# Patient Record
Sex: Male | Born: 1935 | Race: White | Hispanic: No | Marital: Married | Smoking: Former smoker
Health system: Southern US, Community
[De-identification: ages and names within clinical notes are randomized; demographics above are authoritative.]

## PROBLEM LIST (undated history)

## (undated) DIAGNOSIS — J189 Pneumonia, unspecified organism: Secondary | ICD-10-CM

## (undated) DIAGNOSIS — I119 Hypertensive heart disease without heart failure: Secondary | ICD-10-CM

## (undated) DIAGNOSIS — G20 Parkinson's disease: Secondary | ICD-10-CM

## (undated) DIAGNOSIS — G20A1 Parkinson's disease without dyskinesia, without mention of fluctuations: Secondary | ICD-10-CM

## (undated) DIAGNOSIS — I82409 Acute embolism and thrombosis of unspecified deep veins of unspecified lower extremity: Secondary | ICD-10-CM

## (undated) DIAGNOSIS — Z8673 Personal history of transient ischemic attack (TIA), and cerebral infarction without residual deficits: Secondary | ICD-10-CM

## (undated) DIAGNOSIS — I10 Essential (primary) hypertension: Secondary | ICD-10-CM

## (undated) DIAGNOSIS — I5032 Chronic diastolic (congestive) heart failure: Secondary | ICD-10-CM

## (undated) DIAGNOSIS — H9190 Unspecified hearing loss, unspecified ear: Secondary | ICD-10-CM

## (undated) DIAGNOSIS — I2510 Atherosclerotic heart disease of native coronary artery without angina pectoris: Secondary | ICD-10-CM

## (undated) DIAGNOSIS — I4821 Permanent atrial fibrillation: Secondary | ICD-10-CM

## (undated) DIAGNOSIS — E785 Hyperlipidemia, unspecified: Secondary | ICD-10-CM

## (undated) HISTORY — DX: Parkinson's disease without dyskinesia, without mention of fluctuations: G20.A1

## (undated) HISTORY — PX: EYE SURGERY: SHX253

## (undated) HISTORY — DX: Permanent atrial fibrillation: I48.21

## (undated) HISTORY — PX: OTHER SURGICAL HISTORY: SHX169

## (undated) HISTORY — DX: Chronic diastolic (congestive) heart failure: I50.32

## (undated) HISTORY — DX: Hypertensive heart disease without heart failure: I11.9

## (undated) HISTORY — PX: CORONARY ARTERY BYPASS GRAFT: SHX141

## (undated) HISTORY — PX: CARDIAC SURGERY: SHX584

## (undated) HISTORY — DX: Atherosclerotic heart disease of native coronary artery without angina pectoris: I25.10

## (undated) HISTORY — DX: Parkinson's disease: G20

## (undated) HISTORY — PX: LEG SURGERY: SHX1003

---

## 2011-12-21 ENCOUNTER — Emergency Department: Payer: Self-pay | Admitting: Emergency Medicine

## 2011-12-21 LAB — COMPREHENSIVE METABOLIC PANEL
Albumin: 3.6 g/dL (ref 3.4–5.0)
Alkaline Phosphatase: 88 U/L (ref 50–136)
Anion Gap: 7 (ref 7–16)
BUN: 17 mg/dL (ref 7–18)
Bilirubin,Total: 0.3 mg/dL (ref 0.2–1.0)
Calcium, Total: 8.2 mg/dL — ABNORMAL LOW (ref 8.5–10.1)
Chloride: 105 mmol/L (ref 98–107)
Co2: 29 mmol/L (ref 21–32)
Osmolality: 285 (ref 275–301)
Potassium: 3.9 mmol/L (ref 3.5–5.1)
Sodium: 141 mmol/L (ref 136–145)

## 2011-12-21 LAB — CBC
HCT: 42.5 % (ref 40.0–52.0)
HGB: 14.6 g/dL (ref 13.0–18.0)
RBC: 4.62 10*6/uL (ref 4.40–5.90)
WBC: 5.8 10*3/uL (ref 3.8–10.6)

## 2011-12-22 ENCOUNTER — Emergency Department: Payer: Self-pay | Admitting: Emergency Medicine

## 2011-12-22 LAB — COMPREHENSIVE METABOLIC PANEL
Alkaline Phosphatase: 84 U/L (ref 50–136)
Calcium, Total: 8.5 mg/dL (ref 8.5–10.1)
Co2: 30 mmol/L (ref 21–32)
EGFR (Non-African Amer.): >60
Osmolality: 285 (ref 275–301)
Potassium: 4.2 mmol/L (ref 3.5–5.1)
SGPT (ALT): 25 U/L (ref 12–78)
Total Protein: 7.4 g/dL (ref 6.4–8.2)

## 2011-12-22 LAB — CBC
HGB: 15 g/dL (ref 13.0–18.0)
MCH: 32 pg (ref 26.0–34.0)
MCHC: 35 g/dL (ref 32.0–36.0)
MCV: 91 fL (ref 80–100)
Platelet: 166 10*3/uL (ref 150–440)
RDW: 13.3 % (ref 11.5–14.5)

## 2011-12-22 LAB — URINALYSIS, COMPLETE
Bacteria: NONE SEEN
Bilirubin,UR: NEGATIVE
Glucose,UR: NEGATIVE mg/dL (ref 0–75)
Ketone: NEGATIVE
Ph: 6 (ref 4.5–8.0)
RBC,UR: 2 /HPF (ref 0–5)
Squamous Epithelial: NONE SEEN

## 2014-10-31 ENCOUNTER — Ambulatory Visit: Payer: Self-pay | Admitting: Internal Medicine

## 2014-10-31 DIAGNOSIS — I214 Non-ST elevation (NSTEMI) myocardial infarction: Secondary | ICD-10-CM | POA: Insufficient documentation

## 2014-11-14 ENCOUNTER — Ambulatory Visit: Payer: Self-pay | Admitting: Internal Medicine

## 2015-01-24 ENCOUNTER — Ambulatory Visit: Payer: Self-pay | Admitting: Ophthalmology

## 2015-01-31 ENCOUNTER — Ambulatory Visit: Payer: Self-pay

## 2015-04-09 ENCOUNTER — Ambulatory Visit: Payer: Self-pay

## 2015-04-09 DIAGNOSIS — G20 Parkinson's disease: Secondary | ICD-10-CM | POA: Insufficient documentation

## 2015-04-09 LAB — LIPID PANEL
CHOLESTEROL: 156 mg/dL (ref 0–200)
HDL: 35 mg/dL (ref 35–70)
LDL Cholesterol: 73 mg/dL
Triglycerides: 241 mg/dL — AB (ref 40–160)

## 2015-04-09 LAB — BASIC METABOLIC PANEL
BUN: 16 mg/dL (ref 4–21)
CREATININE: 0.8 mg/dL (ref 0.6–1.3)
GLUCOSE: 137 mg/dL
POTASSIUM: 4.1 mmol/L (ref 3.4–5.3)
Sodium: 141 mmol/L (ref 137–147)

## 2015-04-09 LAB — HEPATIC FUNCTION PANEL
ALK PHOS: 72 U/L (ref 25–125)
ALT: 23 U/L (ref 10–40)
AST: 22 U/L (ref 14–40)
Bilirubin, Total: 0.4 mg/dL

## 2015-04-09 LAB — TSH: TSH: 0.85 u[IU]/mL (ref 0.41–5.90)

## 2015-04-16 ENCOUNTER — Other Ambulatory Visit: Payer: Self-pay

## 2015-04-23 ENCOUNTER — Ambulatory Visit: Payer: Self-pay

## 2015-05-01 ENCOUNTER — Ambulatory Visit: Payer: Self-pay | Admitting: Internal Medicine

## 2015-05-03 DIAGNOSIS — I259 Chronic ischemic heart disease, unspecified: Secondary | ICD-10-CM

## 2015-05-03 DIAGNOSIS — G20 Parkinson's disease: Secondary | ICD-10-CM

## 2015-05-14 ENCOUNTER — Other Ambulatory Visit: Payer: Self-pay

## 2015-05-14 DIAGNOSIS — Z139 Encounter for screening, unspecified: Secondary | ICD-10-CM

## 2015-05-14 DIAGNOSIS — G20 Parkinson's disease: Secondary | ICD-10-CM

## 2015-05-14 DIAGNOSIS — I80209 Phlebitis and thrombophlebitis of unspecified deep vessels of unspecified lower extremity: Secondary | ICD-10-CM

## 2015-05-14 DIAGNOSIS — I4891 Unspecified atrial fibrillation: Secondary | ICD-10-CM

## 2015-05-15 ENCOUNTER — Ambulatory Visit: Payer: Self-pay

## 2015-05-15 DIAGNOSIS — I4891 Unspecified atrial fibrillation: Secondary | ICD-10-CM

## 2015-05-15 DIAGNOSIS — Z139 Encounter for screening, unspecified: Secondary | ICD-10-CM

## 2015-05-15 DIAGNOSIS — I80209 Phlebitis and thrombophlebitis of unspecified deep vessels of unspecified lower extremity: Secondary | ICD-10-CM

## 2015-05-16 LAB — URINALYSIS
Bilirubin, UA: NEGATIVE
Glucose, UA: NEGATIVE
KETONES UA: NEGATIVE
Leukocytes, UA: NEGATIVE
NITRITE UA: NEGATIVE
SPEC GRAV UA: 1.017 (ref 1.005–1.030)
UUROB: 0.2 mg/dL (ref 0.2–1.0)
pH, UA: 6 (ref 5.0–7.5)

## 2015-05-16 LAB — COMPREHENSIVE METABOLIC PANEL
ALBUMIN: 4.1 g/dL (ref 3.5–4.7)
ALK PHOS: 71 IU/L (ref 39–117)
ALT: 21 IU/L (ref 0–44)
AST: 18 IU/L (ref 0–40)
Albumin/Globulin Ratio: 1.5 (ref 1.1–2.5)
BUN / CREAT RATIO: 15 (ref 10–22)
BUN: 14 mg/dL (ref 8–27)
Bilirubin Total: 0.7 mg/dL (ref 0.0–1.2)
CALCIUM: 8.7 mg/dL (ref 8.6–10.2)
CO2: 24 mmol/L (ref 18–29)
CREATININE: 0.93 mg/dL (ref 0.76–1.27)
Chloride: 103 mmol/L (ref 96–106)
GFR calc Af Amer: 86 mL/min/{1.73_m2} (ref >59)
GFR, EST NON AFRICAN AMERICAN: 75 mL/min/{1.73_m2} (ref >59)
GLUCOSE: 141 mg/dL — AB (ref 65–99)
Globulin, Total: 2.7 g/dL (ref 1.5–4.5)
Potassium: 4 mmol/L (ref 3.5–5.2)
Sodium: 144 mmol/L (ref 134–144)
TOTAL PROTEIN: 6.8 g/dL (ref 6.0–8.5)

## 2015-05-16 LAB — CBC WITH DIFFERENTIAL/PLATELET
BASOS ABS: 0 10*3/uL (ref 0.0–0.2)
BASOS: 0 %
EOS (ABSOLUTE): 0.1 10*3/uL (ref 0.0–0.4)
EOS: 2 %
HEMOGLOBIN: 16 g/dL (ref 12.6–17.7)
Hematocrit: 47.6 % (ref 37.5–51.0)
IMMATURE GRANULOCYTES: 0 %
Immature Grans (Abs): 0 10*3/uL (ref 0.0–0.1)
LYMPHS ABS: 1.2 10*3/uL (ref 0.7–3.1)
Lymphs: 21 %
MCH: 31.1 pg (ref 26.6–33.0)
MCHC: 33.6 g/dL (ref 31.5–35.7)
MCV: 93 fL (ref 79–97)
MONOCYTES: 6 %
MONOS ABS: 0.3 10*3/uL (ref 0.1–0.9)
Neutrophils Absolute: 4 10*3/uL (ref 1.4–7.0)
Neutrophils: 71 %
Platelets: 164 10*3/uL (ref 150–379)
RBC: 5.14 x10E6/uL (ref 4.14–5.80)
RDW: 14 % (ref 12.3–15.4)
WBC: 5.7 10*3/uL (ref 3.4–10.8)

## 2015-05-16 LAB — LIPID PANEL
CHOLESTEROL TOTAL: 144 mg/dL (ref 100–199)
Chol/HDL Ratio: 3.5 ratio units (ref 0.0–5.0)
HDL: 41 mg/dL (ref >39)
LDL Calculated: 78 mg/dL (ref 0–99)
TRIGLYCERIDES: 123 mg/dL (ref 0–149)
VLDL Cholesterol Cal: 25 mg/dL (ref 5–40)

## 2015-05-16 LAB — PSA, TOTAL AND FREE
PSA FREE PCT: 55.7 %
PSA, Free: 0.39 ng/mL
Prostate Specific Ag, Serum: 0.7 ng/mL (ref 0.0–4.0)

## 2015-06-28 ENCOUNTER — Emergency Department: Payer: Medicaid Other

## 2015-06-28 ENCOUNTER — Inpatient Hospital Stay
Admission: EM | Admit: 2015-06-28 | Discharge: 2015-07-05 | DRG: 871 | Disposition: A | Discharge status: Home Health | Payer: Medicaid Other | Attending: Internal Medicine | Admitting: Internal Medicine

## 2015-06-28 ENCOUNTER — Encounter: Payer: Self-pay | Admitting: Urgent Care

## 2015-06-28 DIAGNOSIS — I4891 Unspecified atrial fibrillation: Secondary | ICD-10-CM | POA: Clinically undetermined

## 2015-06-28 DIAGNOSIS — Z86718 Personal history of other venous thrombosis and embolism: Secondary | ICD-10-CM | POA: Diagnosis not present

## 2015-06-28 DIAGNOSIS — J9601 Acute respiratory failure with hypoxia: Secondary | ICD-10-CM | POA: Diagnosis present

## 2015-06-28 DIAGNOSIS — J811 Chronic pulmonary edema: Secondary | ICD-10-CM | POA: Diagnosis present

## 2015-06-28 DIAGNOSIS — I2510 Atherosclerotic heart disease of native coronary artery without angina pectoris: Secondary | ICD-10-CM | POA: Diagnosis present

## 2015-06-28 DIAGNOSIS — E861 Hypovolemia: Secondary | ICD-10-CM | POA: Diagnosis present

## 2015-06-28 DIAGNOSIS — Z951 Presence of aortocoronary bypass graft: Secondary | ICD-10-CM | POA: Diagnosis not present

## 2015-06-28 DIAGNOSIS — A419 Sepsis, unspecified organism: Secondary | ICD-10-CM | POA: Diagnosis not present

## 2015-06-28 DIAGNOSIS — R509 Fever, unspecified: Secondary | ICD-10-CM

## 2015-06-28 DIAGNOSIS — F0390 Unspecified dementia without behavioral disturbance: Secondary | ICD-10-CM | POA: Diagnosis present

## 2015-06-28 DIAGNOSIS — Z79899 Other long term (current) drug therapy: Secondary | ICD-10-CM

## 2015-06-28 DIAGNOSIS — J180 Bronchopneumonia, unspecified organism: Secondary | ICD-10-CM | POA: Diagnosis present

## 2015-06-28 DIAGNOSIS — Z8673 Personal history of transient ischemic attack (TIA), and cerebral infarction without residual deficits: Secondary | ICD-10-CM

## 2015-06-28 DIAGNOSIS — J189 Pneumonia, unspecified organism: Secondary | ICD-10-CM | POA: Diagnosis not present

## 2015-06-28 DIAGNOSIS — I4819 Other persistent atrial fibrillation: Secondary | ICD-10-CM | POA: Diagnosis present

## 2015-06-28 DIAGNOSIS — E785 Hyperlipidemia, unspecified: Secondary | ICD-10-CM | POA: Diagnosis present

## 2015-06-28 DIAGNOSIS — I5033 Acute on chronic diastolic (congestive) heart failure: Secondary | ICD-10-CM | POA: Diagnosis present

## 2015-06-28 DIAGNOSIS — I110 Hypertensive heart disease with heart failure: Secondary | ICD-10-CM | POA: Diagnosis present

## 2015-06-28 DIAGNOSIS — E871 Hypo-osmolality and hyponatremia: Secondary | ICD-10-CM | POA: Diagnosis present

## 2015-06-28 DIAGNOSIS — G20 Parkinson's disease: Secondary | ICD-10-CM | POA: Diagnosis present

## 2015-06-28 DIAGNOSIS — Z7901 Long term (current) use of anticoagulants: Secondary | ICD-10-CM | POA: Diagnosis not present

## 2015-06-28 DIAGNOSIS — R918 Other nonspecific abnormal finding of lung field: Secondary | ICD-10-CM

## 2015-06-28 DIAGNOSIS — Z8249 Family history of ischemic heart disease and other diseases of the circulatory system: Secondary | ICD-10-CM

## 2015-06-28 DIAGNOSIS — I252 Old myocardial infarction: Secondary | ICD-10-CM

## 2015-06-28 DIAGNOSIS — I481 Persistent atrial fibrillation: Secondary | ICD-10-CM | POA: Diagnosis present

## 2015-06-28 DIAGNOSIS — Z7982 Long term (current) use of aspirin: Secondary | ICD-10-CM | POA: Diagnosis not present

## 2015-06-28 DIAGNOSIS — I482 Chronic atrial fibrillation: Secondary | ICD-10-CM | POA: Diagnosis present

## 2015-06-28 DIAGNOSIS — R0600 Dyspnea, unspecified: Secondary | ICD-10-CM

## 2015-06-28 HISTORY — DX: Acute embolism and thrombosis of unspecified deep veins of unspecified lower extremity: I82.409

## 2015-06-28 HISTORY — DX: Hyperlipidemia, unspecified: E78.5

## 2015-06-28 HISTORY — DX: Personal history of transient ischemic attack (TIA), and cerebral infarction without residual deficits: Z86.73

## 2015-06-28 HISTORY — DX: Essential (primary) hypertension: I10

## 2015-06-28 LAB — CBC WITH DIFFERENTIAL/PLATELET
BASOS PCT: 1 %
Basophils Absolute: 0 10*3/uL (ref 0–0.1)
EOS ABS: 0 10*3/uL (ref 0–0.7)
EOS PCT: 1 %
HEMATOCRIT: 43.3 % (ref 40.0–52.0)
HEMOGLOBIN: 14.7 g/dL (ref 13.0–18.0)
LYMPHS ABS: 0.7 10*3/uL — AB (ref 1.0–3.6)
Lymphocytes Relative: 11 %
MCH: 31.3 pg (ref 26.0–34.0)
MCHC: 34 g/dL (ref 32.0–36.0)
MCV: 92.1 fL (ref 80.0–100.0)
MONO ABS: 0.7 10*3/uL (ref 0.2–1.0)
MONOS PCT: 11 %
NEUTROS PCT: 76 %
Neutro Abs: 4.7 10*3/uL (ref 1.4–6.5)
Platelets: 137 10*3/uL — ABNORMAL LOW (ref 150–440)
RBC: 4.7 MIL/uL (ref 4.40–5.90)
RDW: 13.7 % (ref 11.5–14.5)
WBC: 6.1 10*3/uL (ref 3.8–10.6)

## 2015-06-28 LAB — COMPREHENSIVE METABOLIC PANEL
ALK PHOS: 74 U/L (ref 38–126)
ALT: 25 U/L (ref 17–63)
AST: 28 U/L (ref 15–41)
Albumin: 4.2 g/dL (ref 3.5–5.0)
Anion gap: 6 (ref 5–15)
BUN: 16 mg/dL (ref 6–20)
CALCIUM: 8.6 mg/dL — AB (ref 8.9–10.3)
CHLORIDE: 100 mmol/L — AB (ref 101–111)
CO2: 25 mmol/L (ref 22–32)
CREATININE: 1.09 mg/dL (ref 0.61–1.24)
GFR calc non Af Amer: 60 mL/min — ABNORMAL LOW (ref >60)
GLUCOSE: 118 mg/dL — AB (ref 65–99)
Potassium: 3.8 mmol/L (ref 3.5–5.1)
SODIUM: 131 mmol/L — AB (ref 135–145)
Total Bilirubin: 1.2 mg/dL (ref 0.3–1.2)
Total Protein: 7.5 g/dL (ref 6.5–8.1)

## 2015-06-28 LAB — URINALYSIS COMPLETE WITH MICROSCOPIC (ARMC ONLY)
BILIRUBIN URINE: NEGATIVE
Glucose, UA: NEGATIVE mg/dL
KETONES UR: NEGATIVE mg/dL
Leukocytes, UA: NEGATIVE
Nitrite: NEGATIVE
PH: 6 (ref 5.0–8.0)
Protein, ur: 100 mg/dL — AB
Specific Gravity, Urine: 1.015 (ref 1.005–1.030)
Squamous Epithelial / LPF: NONE SEEN

## 2015-06-28 LAB — RAPID INFLUENZA A&B ANTIGENS: Influenza A (ARMC): NEGATIVE

## 2015-06-28 LAB — RAPID INFLUENZA A&B ANTIGENS (ARMC ONLY): INFLUENZA B (ARMC): NEGATIVE

## 2015-06-28 LAB — PROTIME-INR
INR: 2.96
Prothrombin Time: 30.3 seconds — ABNORMAL HIGH (ref 11.4–15.0)

## 2015-06-28 LAB — TROPONIN I: Troponin I: <0.03 ng/mL (ref <0.031)

## 2015-06-28 LAB — LACTIC ACID, PLASMA: Lactic Acid, Venous: 1.6 mmol/L (ref 0.5–2.0)

## 2015-06-28 LAB — LIPASE, BLOOD: LIPASE: 38 U/L (ref 11–51)

## 2015-06-28 MED ORDER — CEFTRIAXONE SODIUM 1 G IJ SOLR
1.0000 g | Freq: Once | INTRAMUSCULAR | Status: AC
  Administered 2015-06-29: 1 g via INTRAVENOUS
  Filled 2015-06-28: qty 10

## 2015-06-28 MED ORDER — POLYETHYLENE GLYCOL 3350 17 G PO PACK: 17.0000 g | PACK | Freq: Every day | ORAL | Status: DC | PRN

## 2015-06-28 MED ORDER — ACETAMINOPHEN 650 MG RE SUPP: 650.0000 mg | Freq: Four times a day (QID) | RECTAL | Status: DC | PRN

## 2015-06-28 MED ORDER — SODIUM CHLORIDE 0.9 % IV SOLN
INTRAVENOUS | Status: DC
  Administered 2015-06-28 – 2015-07-01 (×4): via INTRAVENOUS

## 2015-06-28 MED ORDER — METOPROLOL TARTRATE 25 MG/10 ML ORAL SUSPENSION
6.2500 mg | Freq: Two times a day (BID) | ORAL | Status: DC
  Filled 2015-06-28 (×3): qty 2.5

## 2015-06-28 MED ORDER — AZITHROMYCIN 500 MG IV SOLR
500.0000 mg | Freq: Once | INTRAVENOUS | Status: AC
  Administered 2015-06-28: 500 mg via INTRAVENOUS
  Filled 2015-06-28: qty 500

## 2015-06-28 MED ORDER — SODIUM CHLORIDE 0.9 % IV BOLUS (SEPSIS)
1000.0000 mL | INTRAVENOUS | Status: AC
  Administered 2015-06-28 (×2): 1000 mL via INTRAVENOUS

## 2015-06-28 MED ORDER — WARFARIN SODIUM 5 MG PO TABS: 5.0000 mg | ORAL_TABLET | Freq: Every day | ORAL | Status: DC

## 2015-06-28 MED ORDER — ASPIRIN EC 81 MG PO TBEC
81.0000 mg | DELAYED_RELEASE_TABLET | Freq: Every day | ORAL | Status: DC
  Administered 2015-06-29 – 2015-07-01 (×3): 81 mg via ORAL
  Filled 2015-06-28 (×3): qty 1

## 2015-06-28 MED ORDER — SODIUM CHLORIDE 0.9 % IV BOLUS (SEPSIS)
500.0000 mL | INTRAVENOUS | Status: AC
  Administered 2015-06-28: 500 mL via INTRAVENOUS

## 2015-06-28 MED ORDER — ONDANSETRON HCL 4 MG/2ML IJ SOLN: 4.0000 mg | Freq: Four times a day (QID) | INTRAMUSCULAR | Status: DC | PRN

## 2015-06-28 MED ORDER — DOCUSATE SODIUM 100 MG PO CAPS
100.0000 mg | ORAL_CAPSULE | Freq: Two times a day (BID) | ORAL | Status: DC
  Administered 2015-06-29 – 2015-07-05 (×12): 100 mg via ORAL
  Filled 2015-06-28 (×12): qty 1

## 2015-06-28 MED ORDER — ONDANSETRON HCL 4 MG PO TABS: 4.0000 mg | ORAL_TABLET | Freq: Four times a day (QID) | ORAL | Status: DC | PRN

## 2015-06-28 MED ORDER — CARBIDOPA-LEVODOPA 25-250 MG PO TABS
1.0000 | ORAL_TABLET | Freq: Three times a day (TID) | ORAL | Status: DC
  Administered 2015-06-29 – 2015-07-05 (×18): 1 via ORAL
  Filled 2015-06-28 (×22): qty 1

## 2015-06-28 MED ORDER — DEXTROSE 5 % IV SOLN
1.0000 g | INTRAVENOUS | Status: DC
  Filled 2015-06-28: qty 10

## 2015-06-28 MED ORDER — ACETAMINOPHEN 325 MG PO TABS
650.0000 mg | ORAL_TABLET | Freq: Four times a day (QID) | ORAL | Status: DC | PRN
  Administered 2015-07-01 – 2015-07-04 (×5): 650 mg via ORAL
  Filled 2015-06-28 (×5): qty 2

## 2015-06-28 MED ORDER — LOSARTAN POTASSIUM 50 MG PO TABS
25.0000 mg | ORAL_TABLET | Freq: Two times a day (BID) | ORAL | Status: DC
  Administered 2015-06-29 – 2015-07-01 (×5): 25 mg via ORAL
  Filled 2015-06-28 (×5): qty 1

## 2015-06-28 MED ORDER — DEXTROSE 5 % IV SOLN
500.0000 mg | INTRAVENOUS | Status: DC
  Administered 2015-06-29 – 2015-06-30 (×2): 500 mg via INTRAVENOUS
  Filled 2015-06-28 (×2): qty 500

## 2015-06-28 MED ORDER — PIPERACILLIN-TAZOBACTAM 3.375 G IVPB 30 MIN
3.3750 g | Freq: Once | INTRAVENOUS | Status: AC
  Administered 2015-06-28: 3.375 g via INTRAVENOUS
  Filled 2015-06-28: qty 50

## 2015-06-28 MED ORDER — VANCOMYCIN HCL IN DEXTROSE 1-5 GM/200ML-% IV SOLN
1000.0000 mg | Freq: Once | INTRAVENOUS | Status: AC
  Administered 2015-06-28: 1000 mg via INTRAVENOUS
  Filled 2015-06-28: qty 200

## 2015-06-28 NOTE — Progress Notes (Signed)
Pharmacy Antibiotic Note  Adam Roberson is a 82 y.o. male admitted on 06/28/2015 with sepsis.  Pharmacy has been consulted for vancomycin & piperacillin/tazobactam dosing.  Plan: Zosyn 3.375g IV q8h (4 hour infusion).   Patient received vancomycin 1000 mg x 1 dose in ED. Will follow with vancomycin 500 mg IV q12h (stacked dose to begin 12 hours after initial dose at 1000 tomorrow morning).  Vancomycin trough scheduled for 4/16 @ 2130 which is prior to 5th dose and should represent steady state. Goal vancomycin trough 15-20 mcg/mL  Kinetics: Adj BW 62 kg Ke: 0.056 Half-life: 12.4 hrs   Height: 5' 2.99" (160 cm) Weight: 154 lb 5.2 oz (70 kg) IBW/kg (Calculated) : 56.88  Temp (24hrs), Avg:100.1 F (37.8 C), Min:99.3 F (37.4 C), Max:100.9 F (38.3 C)   Recent Labs Lab 06/28/15 2008  WBC 6.1  CREATININE 1.09  LATICACIDVEN 1.6    Estimated Creatinine Clearance: 43.5 mL/min (by C-G formula based on Cr of 1.09).    No Known Allergies  Antimicrobials this admission: vancomycin 4/14 >>  Piperacillin/tazobactam 4/14 >>   Dose adjustments this admission: n/a  Microbiology results: 4/14 BCx: Sent  Thank you for allowing pharmacy to be a part of this patient's care.  Cindi CarbonMary M Krishon Adkison, PharmD Clinical Pharmacist 06/28/2015 9:16 PM

## 2015-06-28 NOTE — ED Provider Notes (Signed)
[facility] Emergency Department Provider Note  ____________________________________________  Time seen: 8:10 PM  I have reviewed the triage vital signs and the nursing notes.   HISTORY  Chief Complaint Fall and Weakness  Granddaughter interpreted from [place] to English at the bedside per patient request  HPI Adam Roberson is a 82 y.o. male brought to the ED I family due to lower leg weakness over the past 5-6 days. He also has been having a productive cough and fever for the past 1-2 days. Normal mental status. No falls or injuries.  The patient had previously lived in [place], but [place] been in [place] the past [place].     Past Medical History  Diagnosis Date  . Myocardial infarction (HCC)   . Parkinson disease (HCC)   . Hypertension   . Hyperlipidemia   . DVT of leg (deep venous thrombosis) [facility](HCC)      Patient Active Problem List   Diagnosis Date Noted  . Parkinson disease (HCC) 04/09/2015  . IHD (ischemic heart disease) 10/31/2014     Past Surgical History  Procedure Laterality Date  . Cardiac surgery  age 82    bypass     Current Outpatient Rx  Name  Route  Sig  Dispense  Refill  . aspirin 81 MG tablet   Oral   Take 81 mg by mouth daily.         Marland Kitchen. atorvastatin (LIPITOR) 20 MG tablet   Oral   Take 20 mg by mouth daily.         . carbidopa-levodopa (SINEMET IR) 25-250 MG tablet   Oral   Take 1 tablet by mouth 3 (three) times daily.         Marland Kitchen. losartan (COZAAR) 25 MG tablet   Oral   Take 25 mg by mouth 2 (two) times daily.         . metoprolol (LOPRESSOR) 50 MG tablet   Oral   Take 25 mg by mouth 2 (two) times daily. Takes 1/4 in the am and 1/4 in the pm         . nitroGLYCERIN 2.5 MG CR capsule   Oral   Take 2.5 mg by mouth 2 (two) times daily.         Marland Kitchen. warfarin (COUMADIN) 5 MG tablet   Oral   Take 5 mg by mouth daily. Takes 1/2 tab Monday and Friday Takes 1 tab T, W, Th, Sat, Sun             Allergies Review of patient's allergies indicates no known allergies.   History reviewed. No pertinent family history.  Social History Social History  Substance Use Topics  . Smoking status: Never Smoker   . Smokeless tobacco: None  . Alcohol Use: No    Review of Systems  Constitutional:   Positive fever and chills.  Eyes:   No vision changes.  ENT:   No sore throat. No rhinorrhea. Cardiovascular:   No chest pain. Respiratory:   Positive shortness of breath and productive cough. Gastrointestinal:   Negative for abdominal pain, vomiting and diarrhea.  No bloody stool. Genitourinary:   Negative for dysuria or difficulty urinating. Musculoskeletal:   Negative for focal pain or swelling Neurological:   Negative for headaches. Positive bilateral lower extremity weakness. Parkinson's. 10-point ROS otherwise negative.  ____________________________________________   PHYSICAL EXAM:  VITAL SIGNS: ED Triage Vitals  Enc Vitals Group     BP 06/28/15 1953 139/105 mmHg  Pulse Rate 06/28/15 1953 122     Resp 06/28/15 1953 18     Temp 06/28/15 1953 99.3 F (37.4 C)     Temp Source 06/28/15 1953 Oral     SpO2 06/28/15 1953 95 %     Weight 06/28/15 1953 154 lb 5.2 oz (70 kg)     Height 06/28/15 1953 5' 2.99" (1.6 m)     Head Cir --      Peak Flow --      Pain Score 06/28/15 1956 2     Pain Loc --      Pain Edu? --      Excl. in GC? --     Vital signs reviewed, nursing assessments reviewed.   Constitutional:   Alert and oriented. Ill-appearing. Eyes:   No scleral icterus. No conjunctival pallor. PERRL. EOMI ENT   Head:   Normocephalic and atraumatic.   Nose:   No congestion/rhinnorhea. No septal hematoma   Mouth/Throat:   Dry mucous membranes, no pharyngeal erythema. No peritonsillar mass.    Neck:   No stridor. No SubQ emphysema. No meningismus. Hematological/Lymphatic/Immunilogical:   No cervical lymphadenopathy. Cardiovascular:   Irregularly  irregular rhythm, heart rate 120. Symmetric bilateral radial and DP pulses.  No murmurs.  Respiratory:   Normal respiratory effort without tachypnea nor retractions. Breath sounds are clear and equal bilaterally. No wheezes/rales/rhonchi. Gastrointestinal:   Soft and nontender. Non distended. There is no CVA tenderness.  No rebound, rigidity, or guarding. Genitourinary:   deferred Musculoskeletal:   Nontender with normal range of motion in all extremities. No joint effusions.  No lower extremity tenderness.  No edema. No palpable cords or swelling. Neurologic:   Normal speech and language.  CN 2-10 normal. Continuous tremor that improves with intention. Symmetric lower extremity strength, only mildly diminished from full.. No gross focal neurologic deficits are appreciated.  Skin:    Skin is warm, dry and intact. No rash noted.  No petechiae, purpura, or bullae.  ____________________________________________    LABS (pertinent positives/negatives) (all labs ordered are listed, but only abnormal results are displayed) Labs Reviewed  COMPREHENSIVE METABOLIC PANEL - Abnormal; Notable for the following:    Sodium 131 (*)    Chloride 100 (*)    Glucose, Bld 118 (*)    Calcium 8.6 (*)    GFR calc non Af Amer 60 (*)    All other components within normal limits  CBC WITH DIFFERENTIAL/PLATELET - Abnormal; Notable for the following:    Platelets 137 (*)    Lymphs Abs 0.7 (*)    All other components within normal limits  PROTIME-INR - Abnormal; Notable for the following:    Prothrombin Time 30.3 (*)    All other components within normal limits  URINALYSIS COMPLETEWITH MICROSCOPIC ([facility] ONLY) - Abnormal; Notable for the following:    Color, Urine YELLOW (*)    APPearance CLEAR (*)    Hgb urine dipstick 3+ (*)    Protein, ur 100 (*)    Bacteria, UA RARE (*)    All other components within normal limits  CULTURE, BLOOD (ROUTINE X 2)  CULTURE, BLOOD (ROUTINE X 2)  RAPID INFLUENZA A&B  ANTIGENS ([facility] ONLY)  LIPASE, BLOOD  TROPONIN I  LACTIC ACID, PLASMA   ____________________________________________   EKG  Interpreted by me Atrial fibrillation rate of 98, baseline wander limits interpretation, but there is pierced to be a normal axis, no acute ischemic changes.  ____________________________________________    RADIOLOGY  CT head unremarkable Chest  x-ray reveals diffuse bilateral parenchymal infiltrate consistent with pneumonia.  ____________________________________________   PROCEDURES CRITICAL CARE Performed by: Scotty Court, Melenda Bielak   Total critical care time: 35 minutes  Critical care time was exclusive of separately billable procedures and treating other patients.  Critical care was necessary to treat or prevent imminent or life-threatening deterioration.  Critical care was time spent personally by me on the following activities: development of treatment plan with patient and/or surrogate as well as nursing, discussions with consultants, evaluation of patient's response to treatment, examination of patient, obtaining history from patient or surrogate, ordering and performing treatments and interventions, ordering and review of laboratory studies, ordering and review of radiographic studies, pulse oximetry and re-evaluation of patient's condition.   ____________________________________________   INITIAL IMPRESSION / ASSESSMENT AND PLAN / ED COURSE  Pertinent labs & imaging results that were available during my care of the patient were reviewed by me and considered in my medical decision making (see chart for details).  Patient presents with fever tachycardia and likely pneumonia. Altered mental status or hypotension. Code sepsis protocol was initiated immediately upon initial assessment although I did not inform the secretary for them to be able to activate it with care Link until 9:55 PM  Patient was given IV fluids and empiric vancomycin and Zosyn.  Workup is significant for definitive pneumonia on chest x-ray. Flu test added. Fever and heart rate are improved with IV fluids. Results discussed with the family and they're in agreement with admission. I added azithromycin IV given the atypical nature of the infection. Since of DVT or stroke. Low he presents with sepsis he appears to be hemodynamically stabilized.     ____________________________________________   FINAL CLINICAL IMPRESSION(S) / ED DIAGNOSES  Final diagnoses:  Parkinson disease (HCC)  Bilateral pneumonia  Other specified fever       Portions of this note were generated with dragon dictation software. Dictation errors may occur despite best attempts at proofreading.   Sharman Cheek, MD 06/28/15 2228

## 2015-06-28 NOTE — Progress Notes (Signed)
ANTICOAGULATION CONSULT NOTE - Initial Consult  Pharmacy Consult for warfarin Indication:  History of DVT  No Known Allergies  Patient Measurements: Height: 5' 2.99" (160 cm) Weight: 154 lb 5.2 oz (70 kg) IBW/kg (Calculated) : 56.88  Vital Signs: Temp: 100.9 F (38.3 C) (04/14 2005) Temp Source: Oral (04/14 2005) BP: 117/67 mmHg (04/14 2200) Pulse Rate: 91 (04/14 2200)  Labs:  Recent Labs  06/28/15 2008  HGB 14.7  HCT 43.3  PLT 137*  LABPROT 30.3*  INR 2.96  CREATININE 1.09  TROPONINI <0.03    Estimated Creatinine Clearance: 43.5 mL/min (by C-G formula based on Cr of 1.09).   Medical History: Past Medical History  Diagnosis Date  . Myocardial infarction (HCC)   . Parkinson disease (HCC)   . Hypertension   . Hyperlipidemia   . DVT of leg (deep venous thrombosis) (HCC)     Assessment: Pharmacy consulted to dose and monitor warfarin in this 82 year old male who was taking warfarin prior to admission for a history of DVT. His home regimen is warfarin 5 mg PO daily except 2.5 mg on M&F.   INR on admission is therapeutic at 2.96. Spoke with RN in ED who reports that patient has already taken warfarin dose for today. Of note, patient received one dose of azithromycin in ED this evening.  Goal of Therapy:  INR 2-3 Monitor platelets by anticoagulation protocol: Yes   Plan:  Will not order a dose of warfarin for tonight as patient has already taken 2.5 mg dose today.  Have ordered INR with AM labs tomorrow. Pharmacy will continue to monitor, thank you for the consult.  Cindi CarbonMary M Truett Mcfarlan, PharmD Clinical Pharmacist 06/28/2015,10:14 PM

## 2015-06-28 NOTE — Progress Notes (Signed)
Pharmacy Antibiotic Note  Adam Roberson is a 82 y.o. male admitted on 06/28/2015 with sepsis.  Admitting physician has entered consult for pharmacy to dose ceftriaxone for pneumonia. In H&P note says patient was started on ceftriaxone and azithromycin. The consults for Zosyn and vancomycin from the ED have been discontinued after 1 dose, and a consult for azithromycin has been entered. Plan: 1. Ceftriaxone 1 gm IV Q24H 2. Azithromycin 500 mg IV Q24H  Height: 5' 2.99" (160 cm) Weight: 154 lb 5.2 oz (70 kg) IBW/kg (Calculated) : 56.88  Temp (24hrs), Avg:99.6 F (37.6 C), Min:98.5 F (36.9 C), Max:100.9 F (38.3 C)   Recent Labs Lab 06/28/15 2008  WBC 6.1  CREATININE 1.09  LATICACIDVEN 1.6    Estimated Creatinine Clearance: 43.5 mL/min (by C-G formula based on Cr of 1.09).    No Known Allergies  Antimicrobials this admission: vancomycin 4/14 >>  Piperacillin/tazobactam 4/14 >>   Dose adjustments this admission: n/a  Microbiology results: 4/14 BCx: Sent  Thank you for allowing pharmacy to be a part of this patient's care.  Carola FrostNathan A Alando Colleran, Pharm.D., BCPS Clinical Pharmacist 06/28/2015 11:03 PM

## 2015-06-28 NOTE — H&P (Signed)
[facility] -  at [facility]   PATIENT NAME: Adam Roberson    MR#:  [id number]  DATE OF BIRTH:  Oct 18, 1928  DATE OF ADMISSION:  06/28/2015  PRIMARY CARE PHYSICIAN: [facility]   REQUESTING/REFERRING PHYSICIAN: Dr. Sharman Cheek  CHIEF COMPLAINT:   Chief Complaint  Patient presents with  . Fall  . Weakness    HISTORY OF PRESENT ILLNESS:  Adam Roberson  is a 82 y.o. male with a known history of CAD s/p CABG, Parkinson disease, HTN, Hyperlipidemia, h/o DVT left leg on coumadin presets to the hospital for extreme weakness and fever.  See PCP last month for influenza like illness symptoms and flu test was negative and was treated with Z pack. Symptoms resolved at the time. But his symptoms started with fever and noted to be extremely weak. Couple of falls at home. Patient can only speak Farsi and his daughter can speak minimal english and was able to provide history. Grand-daughter can speak english better. No nausea, vomiting or loss of appetite.  CXR with atypical pneumonia. Family denies any aspiration Being admitted for sepsis. Very tachycardic and febrile on initial presentation. Improved HR with IV fluids.  PAST MEDICAL HISTORY:   Past Medical History  Diagnosis Date  . Myocardial infarction [facility])     s/p CABG  . Parkinson disease (HCC)   . Hypertension   . Hyperlipidemia   . DVT of leg (deep venous thrombosis) (HCC)   . H/O: CVA (cerebrovascular accident)     PAST SURGICAL HISTORY:   Past Surgical History  Procedure Laterality Date  . Cardiac surgery  age 32    bypass    SOCIAL HISTORY:   Social History  Substance Use Topics  . Smoking status: Never Smoker   . Smokeless tobacco: Not on file  . Alcohol Use: No    FAMILY HISTORY:   Family History  Problem Relation Age of Onset  . CAD Mother     DRUG ALLERGIES:  No Known Allergies  REVIEW OF SYSTEMS:   Review of Systems  Constitutional:  Positive for malaise/fatigue. Negative for fever, chills and weight loss.  HENT: Negative for ear discharge, ear pain, nosebleeds and tinnitus.   Eyes: Negative for blurred vision, double vision and photophobia.  Respiratory: Negative for cough, hemoptysis, shortness of breath and wheezing.   Cardiovascular: Negative for chest pain, palpitations, orthopnea and leg swelling.  Gastrointestinal: Negative for heartburn, nausea, vomiting, abdominal pain, diarrhea, constipation and melena.  Genitourinary: Negative for dysuria, urgency, frequency and hematuria.  Musculoskeletal: Positive for falls. Negative for myalgias and neck pain.  Skin: Negative for rash.  Neurological: Positive for weakness. Negative for dizziness, tingling, tremors, sensory change, speech change, focal weakness and headaches.  Endo/Heme/Allergies: Does not bruise/bleed easily.  Psychiatric/Behavioral: Negative for depression.    MEDICATIONS AT HOME:   Prior to Admission medications   Medication Sig Start Date End Date Taking? Authorizing Provider  aspirin 81 MG tablet Take 81 mg by mouth daily.   Yes Historical Provider, MD  atorvastatin (LIPITOR) 20 MG tablet Take 20 mg by mouth daily.   Yes Historical Provider, MD  carbidopa-levodopa (SINEMET IR) 25-250 MG tablet Take 1 tablet by mouth 3 (three) times daily.   Yes Historical Provider, MD  losartan (COZAAR) 25 MG tablet Take 25 mg by mouth 2 (two) times daily.   Yes Historical Provider, MD  metoprolol (LOPRESSOR) 50 MG tablet Take 25 mg by mouth 2 (two) times daily. Takes 1/4 in  the am and 1/4 in the pm   Yes Historical Provider, MD  nitroGLYCERIN 2.5 MG CR capsule Take 2.5 mg by mouth 2 (two) times daily.   Yes Historical Provider, MD  warfarin (COUMADIN) 5 MG tablet Take 5 mg by mouth daily. Takes 1/2 tab Monday and Friday Takes 1 tab T, W, Th, Sat, Sun   Yes Historical Provider, MD      VITAL SIGNS:  Blood pressure 117/67, pulse 91, temperature 98.5 F (36.9 C),  temperature source Oral, resp. rate 24, height 5' 2.99" (1.6 m), weight 70 kg (154 lb 5.2 oz), SpO2 97 %.  PHYSICAL EXAMINATION:   Physical Exam  GENERAL:  82 y.o.-year-old patient lying in the bed with no acute distress. Resting tremors seen. EYES: Pupils equal, round, reactive to light and accommodation. No scleral icterus. Extraocular muscles intact.  HEENT: Head atraumatic, normocephalic. Oropharynx and nasopharynx clear.  NECK:  Supple, no jugular venous distention. No thyroid enlargement, no tenderness.  LUNGS: Normal breath sounds bilaterally, no wheezing, rhonchi. Scattered rales. No use of accessory muscles of respiration. Decreased bibasilar breath sounds. CARDIOVASCULAR: S1, S2 normal. No rubs, or gallops. 3/6 systolic murmur present. ABDOMEN: Soft, nontender, nondistended. Bowel sounds present. No organomegaly or mass.  EXTREMITIES: No pedal edema, cyanosis, or clubbing.  NEUROLOGIC: Cranial nerves II through XII are intact. Muscle strength 5/5 in all extremities. Sensation intact. Gait not checked.  PSYCHIATRIC: The patient is alert and oriented x 3.  SKIN: No obvious rash, lesion, or ulcer.   LABORATORY PANEL:   CBC  Recent Labs Lab 06/28/15 2008  WBC 6.1  HGB 14.7  HCT 43.3  PLT 137*   ------------------------------------------------------------------------------------------------------------------  Chemistries   Recent Labs Lab 06/28/15 2008  NA 131*  K 3.8  CL 100*  CO2 25  GLUCOSE 118*  BUN 16  CREATININE 1.09  CALCIUM 8.6*  AST 28  ALT 25  ALKPHOS 74  BILITOT 1.2   ------------------------------------------------------------------------------------------------------------------  Cardiac Enzymes  Recent Labs Lab 06/28/15 2008  TROPONINI <0.03   ------------------------------------------------------------------------------------------------------------------  RADIOLOGY:  Dg Chest 2 View  06/28/2015  CLINICAL DATA:  Fever and cough for 1  day. Fall, right-sided numbness. Initial encounter. EXAM: CHEST  2 VIEW COMPARISON:  None. FINDINGS: Trachea is midline. Heart size is accentuated by AP technique. There is diffuse interstitial prominence and indistinctness. No definite pleural fluid. Degenerative changes are seen in the spine. IMPRESSION: Diffuse pulmonary parenchymal interstitial prominence and indistinctness may be due to atypical/viral pneumonia or pulmonary edema. Electronically Signed   By: Leanna BattlesMelinda  Blietz M.D.   On: 06/28/2015 21:17   Ct Head Wo Contrast  06/28/2015  CLINICAL DATA:  Fall, weakness, right-sided numbness, bilateral lower extremity weakness, initial encounter. Parkinson's disease. On Coumadin. EXAM: CT HEAD WITHOUT CONTRAST TECHNIQUE: Contiguous axial images were obtained from the base of the skull through the vertex without intravenous contrast. COMPARISON:  None. FINDINGS: Likely remote lacunar infarcts in the basal ganglia. Otherwise, no evidence of an acute infarct, acute hemorrhage, mass lesion, mass effect or hydrocephalus. Ventricular dilatation is likely in proportion to the degree of advanced atrophy. No fracture. Small amount of fluid in the left mastoid air cells. IMPRESSION: 1. No acute intracranial abnormality. 2. Atrophy. 3. Likely remote bilateral basal ganglia lacunar infarcts. 4. Left mastoid effusion. Electronically Signed   By: Leanna BattlesMelinda  Blietz M.D.   On: 06/28/2015 21:09    EKG:   Orders placed or performed during the hospital encounter of 06/28/15  . ED EKG  . ED EKG  .  EKG 12-Lead  . EKG 12-Lead    IMPRESSION AND PLAN:   Adam Roberson  is a 82 y.o. male with a known history of CAD s/p CABG, Parkinson disease, HTN, Hyperlipidemia, h/o DVT left leg on coumadin presets to the hospital for extreme weakness and fever.  Being admitted for sepsis secondary to pneumonia  #1 Sepsis- secondary to atypical pneumonia - f/u blood cultures - started on rocephin, azithromycin Weakness and falls-  physical therapy  #2 Parkinson disease- resting tremors present, shuffling gait - cont carbidopa. Swallow eval- on soft diet now as no teeth  #3 CAD s/p CABG- stable, cont cardiac meds- asa, metoprolol, losartan Check ECHO with diffuse interstitial pattern on CXR to r/o CHF Hold statin with muscle weakness  #4 H/o left LLE clot- cont coumadin, pharmacy to adjust dose INR 2.9 today  #5 Hyponatremia- hypovolemic- IV fluids and monitor  #6 DVT Prophylaxis- already on coumadin  PT consulted.    All the records are reviewed and case discussed with ED provider. Management plans discussed with the patient, family and they are in agreement.  CODE STATUS: Full Code  TOTAL TIME TAKING CARE OF THIS PATIENT: 50 minutes.    Enid Baas M.D on 06/28/2015 at 10:20 PM  Between 7am to 6pm - Pager - [phone]  After 6pm go to [email or web address] - password [email or web address] [facility]  [facility]  Office  [phone]  CC: Primary care physician; [facility]

## 2015-06-28 NOTE — ED Notes (Signed)
Called carelink for code sepsis spoke to [employer]

## 2015-06-28 NOTE — ED Notes (Signed)
Patient presents to the ED with his granddaughter and wife; granddaughter wanting to serve as interpreter - this RN advised that [facility] interpreter would have to be used; used language line for [place] language. Patient with reports of fall x 2 today; last fall at 1730. Patient with weakness, RIGHT side numbness, BLE weakness x 5-6 days. Patient leaning to the RIGHT more over the recent past - PCP advised that it was a normal progression of his Parkinson's. PMH significant for: Parkinson's, DVT, OHS x 16 years ago, HTN, and HLD. Patient on anticoagulant (Warfarin) therapy. Presents c/o pain to neck and BUE.

## 2015-06-28 NOTE — ED Notes (Signed)
Per dr. Scotty CourtStafford, ok for pt to take home medications, parkinson's and bp meds given by wife

## 2015-06-29 ENCOUNTER — Inpatient Hospital Stay: Payer: Medicaid Other

## 2015-06-29 ENCOUNTER — Inpatient Hospital Stay (HOSPITAL_COMMUNITY)
Admit: 2015-06-29 | Discharge: 2015-06-29 | Disposition: A | Discharge status: Home / Self Care | Payer: Medicaid Other | Attending: Internal Medicine | Admitting: Internal Medicine

## 2015-06-29 DIAGNOSIS — I509 Heart failure, unspecified: Secondary | ICD-10-CM

## 2015-06-29 DIAGNOSIS — A419 Sepsis, unspecified organism: Principal | ICD-10-CM

## 2015-06-29 LAB — BASIC METABOLIC PANEL
ANION GAP: 6 (ref 5–15)
BUN: 12 mg/dL (ref 6–20)
CO2: 22 mmol/L (ref 22–32)
Calcium: 7.3 mg/dL — ABNORMAL LOW (ref 8.9–10.3)
Chloride: 104 mmol/L (ref 101–111)
Creatinine, Ser: 0.85 mg/dL (ref 0.61–1.24)
GLUCOSE: 106 mg/dL — AB (ref 65–99)
Potassium: 3.7 mmol/L (ref 3.5–5.1)
Sodium: 132 mmol/L — ABNORMAL LOW (ref 135–145)

## 2015-06-29 LAB — CBC
HEMATOCRIT: 39.2 % — AB (ref 40.0–52.0)
HEMOGLOBIN: 13.4 g/dL (ref 13.0–18.0)
MCH: 31.9 pg (ref 26.0–34.0)
MCHC: 34.2 g/dL (ref 32.0–36.0)
MCV: 93.2 fL (ref 80.0–100.0)
Platelets: 115 10*3/uL — ABNORMAL LOW (ref 150–440)
RBC: 4.21 MIL/uL — AB (ref 4.40–5.90)
RDW: 13.7 % (ref 11.5–14.5)
WBC: 5.9 10*3/uL (ref 3.8–10.6)

## 2015-06-29 LAB — PROTIME-INR
INR: 2.86
Prothrombin Time: 29.5 seconds — ABNORMAL HIGH (ref 11.4–15.0)

## 2015-06-29 LAB — INFLUENZA PANEL BY PCR (TYPE A & B)
H1N1FLUPCR: NOT DETECTED
INFLBPCR: NEGATIVE
Influenza A By PCR: NEGATIVE

## 2015-06-29 LAB — ECHOCARDIOGRAM COMPLETE
HEIGHTINCHES: 62.992 in
Weight: 2469.15 oz

## 2015-06-29 LAB — MRSA PCR SCREENING: MRSA by PCR: NEGATIVE

## 2015-06-29 LAB — BRAIN NATRIURETIC PEPTIDE: B NATRIURETIC PEPTIDE 5: 154 pg/mL — AB (ref 0.0–100.0)

## 2015-06-29 MED ORDER — PIPERACILLIN-TAZOBACTAM 3.375 G IVPB
3.3750 g | Freq: Three times a day (TID) | INTRAVENOUS | Status: DC
Start: 2015-06-29 — End: 2015-07-01
  Administered 2015-06-29 – 2015-07-01 (×7): 3.375 g via INTRAVENOUS
  Filled 2015-06-29 (×8): qty 50

## 2015-06-29 MED ORDER — WARFARIN - PHARMACIST DOSING INPATIENT
Freq: Every day | Status: DC
  Administered 2015-06-29 – 2015-07-04 (×4)

## 2015-06-29 MED ORDER — METOPROLOL TARTRATE 25 MG PO TABS
12.5000 mg | ORAL_TABLET | Freq: Every day | ORAL | Status: DC
  Administered 2015-06-29 – 2015-06-30 (×2): 12.5 mg via ORAL
  Filled 2015-06-29 (×3): qty 1

## 2015-06-29 MED ORDER — WARFARIN SODIUM 2.5 MG PO TABS: 2.5000 mg | ORAL_TABLET | ORAL | Status: DC

## 2015-06-29 MED ORDER — ATORVASTATIN CALCIUM 20 MG PO TABS
20.0000 mg | ORAL_TABLET | Freq: Every day | ORAL | Status: DC
  Administered 2015-06-29 – 2015-07-05 (×7): 20 mg via ORAL
  Filled 2015-06-29 (×7): qty 1

## 2015-06-29 MED ORDER — FUROSEMIDE 10 MG/ML IJ SOLN
20.0000 mg | Freq: Two times a day (BID) | INTRAMUSCULAR | Status: AC
  Administered 2015-06-29 – 2015-06-30 (×2): 20 mg via INTRAVENOUS
  Filled 2015-06-29 (×2): qty 2

## 2015-06-29 MED ORDER — ALPRAZOLAM 0.5 MG PO TABS
0.5000 mg | ORAL_TABLET | Freq: Every evening | ORAL | Status: DC | PRN
  Administered 2015-06-29 – 2015-07-02 (×5): 0.5 mg via ORAL
  Filled 2015-06-29 (×5): qty 1

## 2015-06-29 MED ORDER — WARFARIN SODIUM 5 MG PO TABS
5.0000 mg | ORAL_TABLET | ORAL | Status: DC
  Administered 2015-06-29 – 2015-06-30 (×2): 5 mg via ORAL
  Filled 2015-06-29 (×2): qty 1

## 2015-06-29 NOTE — Progress Notes (Signed)
ANTICOAGULATION CONSULT NOTE - Follow up Consult  Pharmacy Consult for warfarin Indication:  History of DVT  No Known Allergies  Patient Measurements: Height: 5' 2.99" (160 cm) Weight: 154 lb 5.2 oz (70 kg) IBW/kg (Calculated) : 56.88  Vital Signs: Temp: 98.2 F (36.8 C) (04/15 0735) Temp Source: Oral (04/15 0735) BP: 122/93 mmHg (04/15 0453) Pulse Rate: 114 (04/15 0453)  Labs:  Recent Labs  06/28/15 2008 06/29/15 0603  HGB 14.7 13.4  HCT 43.3 39.2*  PLT 137* 115*  LABPROT 30.3* 29.5*  INR 2.96 2.86  CREATININE 1.09 0.85  TROPONINI <0.03  --     Estimated Creatinine Clearance: 55.8 mL/min (by C-G formula based on Cr of 0.85).   Medical History: Past Medical History  Diagnosis Date  . Myocardial infarction [facility](HCC)     s/p CABG  . Parkinson disease (HCC)   . Hypertension   . Hyperlipidemia   . DVT of leg (deep venous thrombosis) (HCC)   . H/O: CVA (cerebrovascular accident)     Assessment: Pharmacy consulted to dose and monitor warfarin in this 82 year old male who was taking warfarin prior to admission for a history of DVT. His home regimen is warfarin 5 mg PO daily except 2.5 mg on M&F.   4/14 INR 2.96, Warfarin 2.5mg  4/15 INR 2.86   Goal of Therapy:  INR 2-3 Monitor platelets by anticoagulation protocol: Yes   Plan:  Will continue current order for warfarin 5mg  daily.   Will follow up INR with AM labs tomorrow.   Pharmacy will continue to monitor and adjust as per consult.  Stormy CardKatsoudas,Fredrico Beedle K, RPh Clinical Pharmacist 06/29/2015,8:36 AM

## 2015-06-29 NOTE — Progress Notes (Signed)
Paged MD for Elevated HR  Into low 150's no response respiratory in room

## 2015-06-29 NOTE — Evaluation (Signed)
Clinical/Bedside Swallow Evaluation Patient Details  Name: Adam Roberson MRN: [id number] Date of Birth: 1928-08-24  Today's Date: 06/29/2015 Time: SLP Start Time (ACUTE ONLY): 0820 SLP Stop Time (ACUTE ONLY): 0915 SLP Time Calculation (min) (ACUTE ONLY): 55 min  Past Medical History:  Past Medical History  Diagnosis Date  . Myocardial infarction [facility](HCC)     s/p CABG  . Parkinson disease (HCC)   . Hypertension   . Hyperlipidemia   . DVT of leg (deep venous thrombosis) (HCC)   . H/O: CVA (cerebrovascular accident)    Past Surgical History:  Past Surgical History  Procedure Laterality Date  . Cardiac surgery  age 82    bypass   HPI:  Pt is a 82 y.o. male with a known history of CAD s/p CABG, afib per NSG, Parkinson disease, HTN, Hyperlipidemia, h/o DVT left leg on coumadin presets to the hospital for extreme weakness and fever. Pt was admitted for sepsis. Very tachycardic and febrile on initial presentation. Currently, pt is awake, verbally responsive and communicated w/ Dtr in native language - needs Interpreter/interpreting in general. Constant UE and body movements in the bed. Pt is unable to maintain upright position for any length of time d/t moving about in bed. He is able to follow commands w/ cues and is oriented to self and Dtr in room. Dtr reported pt ate "well" at home w/ no reports of coughing or choking w/ po's, no recent weight loss.   Assessment / Plan / Recommendation Clinical Impression  Pt appeared to adequately tolerate trials of thin liquids via straw and purees/soft solids w/ no overt s/s of aspiration noted during/post po trials; clear vocal quality, no coughing, and no decline in respiratory status occured w/ po's. Pt opened mouth when ready for the next bolus/bite w./ verbal cues given by Dtr. Oral phase c/b min. increased time for gumming and mashing foods for transfer and swallow sec. to missing sufficient dentition for mastication. Pt required full feeding  assistance. Post meal, Dtr stated pt felt a little SOB but after positioned more upright again and supported w/ pillows, he told Dtr he felt his breathing was better - closed mouth, easy nasal breathing observed. Briefly discussed question of indigestion but Dtr and pt denied such. Recommend a Dys. 2 consistency diet d/t lacking denitition and min. oral phase deficits impacted by Parkinson's Dis. and lacking full dentition for mastication. Rec. meds in puree for easier, controlled swallowing; general aspiration precautions and Reflux precautions as indicated. NSG updated on above session/results/recs. An Interpreter was requested by NSG to come for full communication w/ pt(despite any potential Cognitive deficits) as well as w/ other family members on POC.Of note, pt swallowing pills whole in puree well w/ NSG post eval.     Aspiration Risk  Mild aspiration risk (d/t tremors, lacking full dentition, and Cognitive status)    Diet Recommendation  Dys. 2 w/ thin liquids; general aspiration precautions; assistance w/ feeding d/t Parkinson's Dis. And Cognitive status.  Medication Administration: Whole meds with puree    Other  Recommendations Recommended Consults:  (Dietician possibly) Oral Care Recommendations: Oral care BID;Staff/trained caregiver to provide oral care   Follow up Recommendations  None (TBD)    Frequency and Duration min 2x/week          Prognosis Prognosis for Safe Diet Advancement: Good Barriers to Reach Goals: Cognitive deficits (Parkinson's Dis.)      Swallow Study   General Date of Onset: 06/28/15 HPI: Pt is a 82 y.o. male  with a known history of CAD s/p CABG, afib per NSG, Parkinson disease, HTN, Hyperlipidemia, h/o DVT left leg on coumadin presets to the hospital for extreme weakness and fever. Pt was admitted for sepsis. Very tachycardic and febrile on initial presentation. Currently, pt is awake, verbally responsive and communicated w/ Dtr in native language - needs  Interpreter/interpreting in general. Constant UE and body movements in the bed. Pt is unable to maintain upright position for any length of time d/t moving about in bed. He is able to follow commands w/ cues and is oriented to self and Dtr in room. Dtr reported pt ate "well" at home w/ no reports of coughing or choking w/ po's, no recent weight loss. Type of Study: Bedside Swallow Evaluation Previous Swallow Assessment: none indicated by Dtr Diet Prior to this Study:  (soft foods at home) Temperature Spikes Noted: No (temp 98.2 this AM;  wbc 5.9) Respiratory Status: Room air History of Recent Intubation: No Behavior/Cognition: Alert;Cooperative;Pleasant mood;Requires cueing;Distractible Oral Cavity Assessment: Within Functional Limits Oral Care Completed by SLP: Recent completion by staff Oral Cavity - Dentition:  (few upper dentition only; no bottom dentition) Vision:  (n/a - requires feeding) Self-Feeding Abilities: Total assist (tremors) Patient Positioning: Upright in bed Baseline Vocal Quality: Normal;Low vocal intensity Volitional Cough: Cognitively unable to elicit Volitional Swallow: Unable to elicit    Oral/Motor/Sensory Function Overall Oral Motor/Sensory Function: Within functional limits (limited assessment sec. to Cognitive status/attention)   Ice Chips Ice chips: Not tested   Thin Liquid Thin Liquid: Within functional limits Presentation: Straw (fed; 7-8 trials w/ 2-3 sips each time) Other Comments: shaky oral movements finding straw    Nectar Thick Nectar Thick Liquid: Not tested   Honey Thick Honey Thick Liquid: Not tested   Puree Puree: Within functional limits Presentation: Spoon (fed; 10+ trials)   Solid   GO   Solid: Impaired (mech soft) Presentation: Spoon (fed; 7-8 trials of eggs, pancakes) Oral Phase Impairments: Impaired mastication (tremors; discoordinated movements; gumming foods) Oral Phase Functional Implications: Impaired mastication (prolonged oral  phase) Pharyngeal Phase Impairments:  (none) Other Comments: min. increased time to clear sec. to extra time required for gumming to transfer/swallow       Jerilynn Som, MS, CCC-SLP  Watson,Katherine 06/29/2015,10:03 AM

## 2015-06-29 NOTE — Progress Notes (Signed)
Patient ID: Adam Roberson, male   DOB: 28-Nov-1928, 82 y.o.   MRN: [id number] [facility] [place] [facility]   PATIENT NAME: Adam Roberson    MR#:  [id number]  DATE OF BIRTH:  28-Nov-1928  SUBJECTIVE:   Speaks farsi. Grandson helped to interprete. Language line .the patient confused to use it Breathing better. Low grade fever Denies any pain Ate some BF per family REVIEW OF SYSTEMS:   Review of Systems  Constitutional: Positive for fever and malaise/fatigue. Negative for chills and weight loss.  HENT: Negative for ear discharge, ear pain and nosebleeds.   Eyes: Negative for blurred vision, pain and discharge.  Respiratory: Positive for cough and shortness of breath. Negative for sputum production, wheezing and stridor.   Cardiovascular: Negative for chest pain, palpitations, orthopnea and PND.  Gastrointestinal: Negative for nausea, vomiting, abdominal pain and diarrhea.  Genitourinary: Negative for urgency and frequency.  Musculoskeletal: Positive for falls. Negative for back pain and joint pain.  Neurological: Positive for tremors and weakness. Negative for sensory change, speech change and focal weakness.  Psychiatric/Behavioral: Negative for depression and hallucinations. The patient is not nervous/anxious.   All other systems reviewed and are negative.  Tolerating Diet:yes Tolerating PT: pending  DRUG ALLERGIES:  No Known Allergies  VITALS:  Blood pressure 130/86, pulse 108, temperature 100.9 F (38.3 C), temperature source Oral, resp. rate 18, height 5' 2.99" (1.6 m), weight 70 kg (154 lb 5.2 oz), SpO2 97 %.  PHYSICAL EXAMINATION:   Physical Exam  GENERAL:  82 y.o.-year-old patient lying in the bed with no acute distress. Appears ill EYES: Pupils equal, round, reactive to light and accommodation. No scleral icterus. Extraocular muscles intact.  HEENT: Head atraumatic, normocephalic. Oropharynx and nasopharynx clear.  NECK:   Supple, no jugular venous distention. No thyroid enlargement, no tenderness.  LUNGS: Normal breath sounds bilaterally, no wheezing,bibasilar rales, rhonchi. No use of accessory muscles of respiration.  CARDIOVASCULAR: S1, S2 normal. No murmurs, rubs, or gallops. tachycardia ABDOMEN: Soft, nontender, nondistended. Bowel sounds present. No organomegaly or mass.  EXTREMITIES: No cyanosis, clubbing or edema b/l.    NEUROLOGIC: Cranial nerves II through XII are intact. No focal Motor or sensory deficits b/l.   PSYCHIATRIC:  patient is alert and oriented x 3.  SKIN: No obvious rash, lesion, or ulcer.   LABORATORY PANEL:  CBC  Recent Labs Lab 06/29/15 0603  WBC 5.9  HGB 13.4  HCT 39.2*  PLT 115*    Chemistries   Recent Labs Lab 06/28/15 2008 06/29/15 0603  NA 131* 132*  K 3.8 3.7  CL 100* 104  CO2 25 22  GLUCOSE 118* 106*  BUN 16 12  CREATININE 1.09 0.85  CALCIUM 8.6* 7.3*  AST 28  --   ALT 25  --   ALKPHOS 74  --   BILITOT 1.2  --    Cardiac Enzymes  Recent Labs Lab 06/28/15 2008  TROPONINI <0.03   RADIOLOGY:  Dg Chest 2 View  06/28/2015  CLINICAL DATA:  Fever and cough for 1 day. Fall, right-sided numbness. Initial encounter. EXAM: CHEST  2 VIEW COMPARISON:  None. FINDINGS: Trachea is midline. Heart size is accentuated by AP technique. There is diffuse interstitial prominence and indistinctness. No definite pleural fluid. Degenerative changes are seen in the spine. IMPRESSION: Diffuse pulmonary parenchymal interstitial prominence and indistinctness may be due to atypical/viral pneumonia or pulmonary edema. Electronically Signed   By: Leanna BattlesMelinda  Blietz M.D.   On: 06/28/2015 21:17  Ct Head Wo Contrast  06/28/2015  CLINICAL DATA:  Fall, weakness, right-sided numbness, bilateral lower extremity weakness, initial encounter. Parkinson's disease. On Coumadin. EXAM: CT HEAD WITHOUT CONTRAST TECHNIQUE: Contiguous axial images were obtained from the base of the skull through the  vertex without intravenous contrast. COMPARISON:  None. FINDINGS: Likely remote lacunar infarcts in the basal ganglia. Otherwise, no evidence of an acute infarct, acute hemorrhage, mass lesion, mass effect or hydrocephalus. Ventricular dilatation is likely in proportion to the degree of advanced atrophy. No fracture. Small amount of fluid in the left mastoid air cells. IMPRESSION: 1. No acute intracranial abnormality. 2. Atrophy. 3. Likely remote bilateral basal ganglia lacunar infarcts. 4. Left mastoid effusion. Electronically Signed   By: Leanna Battles M.D.   On: 06/28/2015 21:09   ASSESSMENT AND PLAN:  Adam Roberson is a 82 y.o. male with a known history of CAD s/p CABG, Parkinson disease, HTN, Hyperlipidemia, h/o DVT left leg on coumadin presets to the hospital for extreme weakness and fever.  Being admitted for sepsis secondary to pneumonia  #1 Sepsis- secondary to atypical bilateral pneumonia - f/u blood cultures - started on rocephin, azithromycin Weakness and falls- physical therapy -pulmonary to see pt -pt has a very remote h/o smoking. -no h/o COPD  #2 Parkinson disease- resting tremors present, shuffling gait - cont carbidopa. Swallow eval- on soft diet now as no teeth  #3 CAD s/p CABG- stable, cont cardiac meds- asa, metoprolol, losartan Check ECHO with diffuse interstitial pattern on CXR to r/o CHF Hold statin with muscle weakness  #4 H/o left LLE clot- cont coumadin, pharmacy to adjust dose INR 2.9 today  #5 Hyponatremia- hypovolemic- IV fluids and monitor  #6 DVT Prophylaxis- already on coumadin  PT consulted.  D/w dter and gransdon Case discussed with Care Management/Social Worker. Management plans discussed with the patient, family and they are in agreement.  CODE STATUS: full  DVT Prophylaxis:lovenox  TOTAL TIME TAKING CARE OF THIS PATIENT:30 minutes.  >50% time spent on counselling and coordination of care  POSSIBLE D/C IN2-3 DAYS, DEPENDING ON  CLINICAL CONDITION.  Note: This dictation was prepared with Dragon dictation along with smaller phrase technology. Any transcriptional errors that result from this process are unintentional.  Karuna Balducci M.D on 06/29/2015 at 1:34 PM  Between 7am to 6pm - Pager - [phone]  After 6pm go to [email or web address] - password [email or web address]  [facility]  Office  [phone]  CC: Primary care physician; [facility]

## 2015-06-29 NOTE — Progress Notes (Signed)
ANTIBIOTIC CONSULT NOTE - INITIAL  Pharmacy Consult for Zosyn  Indication: sepsis  No Known Allergies  Patient Measurements: Height: 5' 2.99" (160 cm) Weight: 154 lb 5.2 oz (70 kg) IBW/kg (Calculated) : 56.88 Adjusted Body Weight:   Vital Signs: Temp: 97.8 F (36.6 C) (04/15 1342) Temp Source: Axillary (04/15 1342) BP: 146/81 mmHg (04/15 1344) Pulse Rate: 109 (04/15 1344) Intake/Output from previous day: 04/14 0701 - 04/15 0700 In: 2500 [I.V.:2500] Out: -  Intake/Output from this shift:    Labs:  Recent Labs  06/28/15 2008 06/29/15 0603  WBC 6.1 5.9  HGB 14.7 13.4  PLT 137* 115*  CREATININE 1.09 0.85   Estimated Creatinine Clearance: 55.8 mL/min (by C-G formula based on Cr of 0.85). No results for input(s): VANCOTROUGH, VANCOPEAK, VANCORANDOM, GENTTROUGH, GENTPEAK, GENTRANDOM, TOBRATROUGH, TOBRAPEAK, TOBRARND, AMIKACINPEAK, AMIKACINTROU, AMIKACIN in the last 72 hours.   Microbiology: Recent Results (from the past 720 hour(s))  Blood culture (routine x 2)     Status: None (Preliminary result)   Collection Time: 06/28/15  8:09 PM  Result Value Ref Range Status   Specimen Description BLOOD RIGHT ASSIST CONTROL  Final   Special Requests   Final    BOTTLES DRAWN AEROBIC AND ANAEROBIC 11CCAERO,9CCANA   Culture NO GROWTH < 24 HOURS  Final   Report Status PENDING  Incomplete  Blood culture (routine x 2)     Status: None (Preliminary result)   Collection Time: 06/28/15  8:21 PM  Result Value Ref Range Status   Specimen Description BLOOD RIGHT HAND  Final   Special Requests   Final    BOTTLES DRAWN AEROBIC AND ANAEROBIC 10CCAERO,1CCANA   Culture NO GROWTH < 24 HOURS  Final   Report Status PENDING  Incomplete  Rapid Influenza A&B Antigens ([facility] only)     Status: None   Collection Time: 06/28/15  9:47 PM  Result Value Ref Range Status   Influenza A ([facility]) NEGATIVE NEGATIVE Final   Influenza B ([facility]) NEGATIVE NEGATIVE Final    Medical History: Past Medical History   Diagnosis Date  . Myocardial infarction [facility])     s/p CABG  . Parkinson disease (HCC)   . Hypertension   . Hyperlipidemia   . DVT of leg (deep venous thrombosis) (HCC)   . H/O: CVA (cerebrovascular accident)     Medications:  Prescriptions prior to admission  Medication Sig Dispense Refill Last Dose  . aspirin 81 MG tablet Take 81 mg by mouth daily.   unknown at unknown  . atorvastatin (LIPITOR) 20 MG tablet Take 20 mg by mouth daily.   unknown at unknown  . carbidopa-levodopa (SINEMET IR) 25-250 MG tablet Take 1 tablet by mouth 3 (three) times daily.   unknown at unknown  . losartan (COZAAR) 25 MG tablet Take 25 mg by mouth 2 (two) times daily.   unknown at unknown  . metoprolol (LOPRESSOR) 50 MG tablet Take 25 mg by mouth 2 (two) times daily. Takes 1/4 in the am and 1/4 in the pm   unknown at unknown  . nitroGLYCERIN 2.5 MG CR capsule Take 2.5 mg by mouth 2 (two) times daily.   unknown at unknown  . warfarin (COUMADIN) 5 MG tablet Take 5 mg by mouth daily. Takes 1/2 tab Monday and Friday Takes 1 tab T, W, Th, Sat, Sun   unknown at unknown   Assessment: No Pseudomonas risk factors noted .   CrCl = 55.8 ml/min   Goal of Therapy:  resolution of infection  Plan:  Expected duration 7 days with resolution of temperature and/or normalization of WBC   Zosyn 3.375 gm IV Q8H EI ordered to start on 4/15.   Adam Roberson D 06/29/2015,2:36 PM

## 2015-06-29 NOTE — Progress Notes (Signed)
Called pharmacy about metoprolol suspension not available they are changing to a pill form

## 2015-06-29 NOTE — Progress Notes (Signed)
   06/29/15 1039  Oxygen Therapy/Pulse Ox  O2 Device Nasal Cannula  O2 Flow Rate (L/min) 2 L/min  SpO2 97 %  Placed patient on 2lpm nasal canula for support during tachycardic event.  RN is aware.  Patient did not desaturate.

## 2015-06-29 NOTE — Progress Notes (Signed)
Spoke with Dr Allena KatzPatel about patient elevated HR   In low 150's.  Gave him 12.5 of metoprolol oral, resp 27 Temp 100.5  No new orders per MD continue to monitor

## 2015-06-29 NOTE — Progress Notes (Signed)
PT Cancellation Note  Patient Details Name: Adam Roberson MRN: [id number] DOB: 10-29-1928   Cancelled Treatment:    Reason Eval/Treat Not Completed: Patient not medically ready. Chart reviewed, RN consulted. PT evaluation contraindicated at this time due to afib c RVR, HR ranging from 120-150's.  Will attempt at later date/time.     2:33 PM, 06/29/2015 Rosamaria LintsAllan C Maripaz Mullan, PT, DPT PRN Physical Therapist - [facility] License # [id number] [phone](ASCOM)  [phone] (mobile)

## 2015-06-29 NOTE — Progress Notes (Signed)
Called language line at [phone]  Got Farsi interpretor # [phone]   Patient is confused rep spoke with daughter about meds to be given this afternoon family verbalized understanding

## 2015-06-29 NOTE — Consult Note (Signed)
[facility] Pulmonary Medicine Consultation      Assessment and Plan:  Acute hypoxic respiratory failure. -Etiology is suspected pneumonia as well as possible pulmonary edema. Review of chest x-ray images shows bilateral interstitial changes, subsequent chest x-ray shows right hilar prominence, as well as a left lower lobe infiltrate, which showed may be indicative of pneumonia. -Patient is currently on 5-6 L of nasal cannula, discussed with the patient's family that should his rest her status decline, he may require more aggressive interventions and potentially life support. They're going to discuss this with the patient's wife and consider whether they want the patient to be on life support, meanwhile his status remains full code.  Pneumonia. -Continue vancomycin, will start the patient back on Zosyn, we will discontinue ceftriaxone. We'll check MRSA screen.  Interstitial edema. -Possible pulmonary edema seen on chest x-ray, review of echocardiogram results shows pulmonary hypertension with a PAP of46, ejection fraction of 60%. -We will check a BNP, will give 2 doses of Lasix IV push.  Parkinson's disease. -Presence dementia increases the risks of complications, particularly in light of the patient's pneumonia.   Date: 06/29/2015  MRN# [id number] Adam Roberson Aug 11, 1928  Referring Physician:   Kendell Sagraves is a 82 y.o. old male seen in consultation for chief complaint of:    Chief Complaint  Patient presents with  . Fall  . Weakness    HPI:   The patient is an 82 year old male, who is not speaking [place], history obtained from his daughter and his granddaughter. Apparently the patient had a fall at home. Soon afterwards he noticed that the patient was warm and were concerned that he had a fever. Subsequently was brought into the hospital, they noted no problems with dyspnea, cough, choking while eating. Family reports that his functional status was very limited, he  apparently couldn't ambulate a few feet around his house, but otherwise did not really leave the house. He has significant Parkinson's disease and has a baseline tremor. Currently the patient has altered mental status and cannot provide history.  Review of patient's chest x-rays as above, interstitial deep edema with possible pneumonia. Review echocardiogram shows pulmonary artery pressure of 46, ejection fraction of 60%.  PMHX:   Past Medical History  Diagnosis Date  . Myocardial infarction [facility])     s/p CABG  . Parkinson disease (HCC)   . Hypertension   . Hyperlipidemia   . DVT of leg (deep venous thrombosis) (HCC)   . H/O: CVA (cerebrovascular accident)    Surgical Hx:  Past Surgical History  Procedure Laterality Date  . Cardiac surgery  age 78    bypass   Family Hx:  Family History  Problem Relation Age of Onset  . CAD Mother    Social Hx:   Social History  Substance Use Topics  . Smoking status: Never Smoker   . Smokeless tobacco: None  . Alcohol Use: No   Medication:   No current outpatient prescriptions on file.    Allergies:  Review of patient's allergies indicates no known allergies.  Review of Systems: The patient has altered mental status and cannot provide review of systems.  Physical Examination:   VS: BP 146/81 mmHg  Pulse 109  Temp(Src) 97.8 F (36.6 C) (Axillary)  Resp 22  Ht 5' 2.99" (1.6 m)  Wt 154 lb 5.2 oz (70 kg)  BMI 27.34 kg/m2  SpO2 100%  General Appearance: No distress  Neuro:without focal findings,  speech slurred HEENT: PERRLA, EOM intact.  Pulmonary: normal breath sounds, No wheezing. Decreased air entry bilaterally. CardiovascularNormal S1,S2.  No m/r/g.   Abdomen: Benign, Soft, non-tender. Renal:  No costovertebral tenderness  GU:  No performed at this time. Endoc: No evident thyromegaly, no signs of acromegaly. Skin:   warm, no rashes, no ecchymosis  Extremities: normal, no cyanosis, clubbing.  Other findings:     LABORATORY PANEL:   CBC  Recent Labs Lab 06/29/15 0603  WBC 5.9  HGB 13.4  HCT 39.2*  PLT 115*   ------------------------------------------------------------------------------------------------------------------  Chemistries   Recent Labs Lab 06/28/15 2008 06/29/15 0603  NA 131* 132*  K 3.8 3.7  CL 100* 104  CO2 25 22  GLUCOSE 118* 106*  BUN 16 12  CREATININE 1.09 0.85  CALCIUM 8.6* 7.3*  AST 28  --   ALT 25  --   ALKPHOS 74  --   BILITOT 1.2  --    ------------------------------------------------------------------------------------------------------------------  Cardiac Enzymes  Recent Labs Lab 06/28/15 2008  TROPONINI <0.03   ------------------------------------------------------------  RADIOLOGY:  Dg Chest 2 View  06/28/2015  CLINICAL DATA:  Fever and cough for 1 day. Fall, right-sided numbness. Initial encounter. EXAM: CHEST  2 VIEW COMPARISON:  None. FINDINGS: Trachea is midline. Heart size is accentuated by AP technique. There is diffuse interstitial prominence and indistinctness. No definite pleural fluid. Degenerative changes are seen in the spine. IMPRESSION: Diffuse pulmonary parenchymal interstitial prominence and indistinctness may be due to atypical/viral pneumonia or pulmonary edema. Electronically Signed   By: Leanna BattlesMelinda  Blietz M.D.   On: 06/28/2015 21:17   Ct Head Wo Contrast  06/28/2015  CLINICAL DATA:  Fall, weakness, right-sided numbness, bilateral lower extremity weakness, initial encounter. Parkinson's disease. On Coumadin. EXAM: CT HEAD WITHOUT CONTRAST TECHNIQUE: Contiguous axial images were obtained from the base of the skull through the vertex without intravenous contrast. COMPARISON:  None. FINDINGS: Likely remote lacunar infarcts in the basal ganglia. Otherwise, no evidence of an acute infarct, acute hemorrhage, mass lesion, mass effect or hydrocephalus. Ventricular dilatation is likely in proportion to the degree of advanced atrophy. No  fracture. Small amount of fluid in the left mastoid air cells. IMPRESSION: 1. No acute intracranial abnormality. 2. Atrophy. 3. Likely remote bilateral basal ganglia lacunar infarcts. 4. Left mastoid effusion. Electronically Signed   By: Leanna BattlesMelinda  Blietz M.D.   On: 06/28/2015 21:09       Thank  you for the consultation and for allowing [facility] to assist in the care of your patient. Our recommendations are noted above.  Please contact us if we can be of further service.   Wells Guileseep Emmert Roethler, MD.  Board Certified in Internal Medicine, Pulmonary Medicine, Critical Care Medicine, and Sleep Medicine.  [facility] Office Number: [phone]  Santiago Gladavid Kasa, M.D.  Stephanie AcreVishal Mungal, M.D.  Billy Fischeravid Simonds, M.D  06/29/2015

## 2015-06-29 NOTE — Progress Notes (Signed)
*  PRELIMINARY RESULTS* Echocardiogram 2D Echocardiogram has been performed.  Adam Roberson 06/29/2015, 11:51 AM

## 2015-06-30 ENCOUNTER — Inpatient Hospital Stay: Payer: Medicaid Other

## 2015-06-30 LAB — PROTIME-INR
INR: 2.86
Prothrombin Time: 29.5 s — ABNORMAL HIGH (ref 11.4–15.0)

## 2015-06-30 MED ORDER — FUROSEMIDE 10 MG/ML IJ SOLN
20.0000 mg | Freq: Once | INTRAMUSCULAR | Status: AC
  Administered 2015-06-30: 17:00:00 20 mg via INTRAVENOUS
  Filled 2015-06-30: qty 2

## 2015-06-30 MED ORDER — METOPROLOL TARTRATE 25 MG PO TABS
12.5000 mg | ORAL_TABLET | Freq: Two times a day (BID) | ORAL | Status: DC
  Administered 2015-06-30 – 2015-07-01 (×2): 12.5 mg via ORAL
  Filled 2015-06-30 (×2): qty 1

## 2015-06-30 NOTE — Progress Notes (Signed)
ANTICOAGULATION CONSULT NOTE - Follow up Consult  Pharmacy Consult for warfarin Indication:  History of DVT  No Known Allergies  Patient Measurements: Height: 5' 2.99" (160 cm) Weight: 154 lb 5.2 oz (70 kg) IBW/kg (Calculated) : 56.88  Vital Signs: Temp: 99 F (37.2 C) (04/15 2131) Temp Source: Oral (04/15 2131) BP: 145/89 mmHg (04/16 0502) Pulse Rate: 115 (04/16 0502)  Labs:  Recent Labs  06/28/15 2008 06/29/15 0603 06/30/15 0528  HGB 14.7 13.4  --   HCT 43.3 39.2*  --   PLT 137* 115*  --   LABPROT 30.3* 29.5* 29.5*  INR 2.96 2.86 2.86  CREATININE 1.09 0.85  --   TROPONINI <0.03  --   --     Estimated Creatinine Clearance: 55.8 mL/min (by C-G formula based on Cr of 0.85).   Medical History: Past Medical History  Diagnosis Date  . Myocardial infarction [facility](HCC)     s/p CABG  . Parkinson disease (HCC)   . Hypertension   . Hyperlipidemia   . DVT of leg (deep venous thrombosis) (HCC)   . H/O: CVA (cerebrovascular accident)     Assessment: Pharmacy consulted to dose and monitor warfarin in this 82 year old male who was taking warfarin prior to admission for a history of DVT. His home regimen is warfarin 5 mg PO daily except 2.5 mg on M&F.   4/14 INR 2.96, Warfarin 2.5mg  4/15 INR 2.86, Warfarin 5 mg 4/16 INR 2.86  Goal of Therapy:  INR 2-3 Monitor platelets by anticoagulation protocol: Yes   Plan:  Will continue current order for warfarin 5mg  daily.   Will follow up INR with AM labs tomorrow.   Pharmacy will continue to monitor and adjust as per consult.  Stormy CardKatsoudas,Cher Egnor K, RPh Clinical Pharmacist 06/30/2015,7:18 AM

## 2015-06-30 NOTE — Plan of Care (Signed)
Spoke to Dr. Allena KatzPatel abt pt's elevated HR in 150's - was giving meds at time.  Pt HR now 130.

## 2015-06-30 NOTE — Progress Notes (Signed)
PT Cancellation Note  Patient Details Name: Adam Roberson MRN: [id number] DOB: 16-Sep-1928   Cancelled Treatment:    Reason Eval/Treat Not Completed: Patient not medically ready. Chart reviewed: HR remains poorly controled at rest with most recent value in chart at 180bpm. Will continue to hold until patient is more medically stable and appropriate for PT evaluation.    10:09 AM, 06/30/2015 Rosamaria LintsAllan C Michella Detjen, PT, DPT PRN Physical Therapist - [facility] License # [id number] [phone](ASCOM)  [phone] (mobile)

## 2015-06-30 NOTE — Progress Notes (Signed)
[facility]* [facility]     Assessment and Plan:  Acute hypoxic respiratory failure. -Etiology is suspected pneumonia as well as possible pulmonary edema. Review of chest x-ray images shows bilateral interstitial changes, subsequent chest x-ray shows right hilar prominence, as well as a left lower lobe infiltrate, which showed may be indicative of pneumonia. Most recent CXR film today shows reduced interstitial edema today after receiving diuresis.  -Patient is currently on 3 L nasal cannula, down from 5-6 L of nasal cannula -The patient's respiratory status appears to have improved from yesterday.  Pneumonia. -MRSA PCR negative. We will discontinue vancomycin. -Continue Zosyn, azithromycin.  Interstitial edema. -Possible pulmonary edema seen on chest x-ray, review of echocardiogram results shows pulmonary hypertension with a PAP of46, ejection fraction of 60%. -Repeat one dose of Lasix 20 mg 1 today.  Parkinson's disease. -Presence dementia increases the risks of complications, particularly in light of the patient's pneumonia.   Date: 06/30/2015  MRN# [id number] Adam Roberson August 21, 1928   Adam Roberson is a 82 y.o. old male seen in follow up for chief complaint of  Chief Complaint  Patient presents with  . Fall  . Weakness     HPI:   Patient is minimally verbal, no new complaints today.  Allergies:  Review of patient's allergies indicates no known allergies.  Review of Systems: Cannot provide review of systems due to Parkinson's disease, nonverbal.  Physical Examination:   VS: BP 107/58 mmHg  Pulse 180  Temp(Src) 99 F (37.2 C) (Oral)  Resp 17  Ht 5' 2.99" (1.6 m)  Wt 154 lb 5.2 oz (70 kg)  BMI 27.34 kg/m2  SpO2 98%  General Appearance: No distress  Neuro:without focal findings,   HEENT: PERRLA, EOM intact. Pulmonary: normal breath sounds, No wheezing.   CardiovascularNormal S1,S2.  No m/r/g.   Abdomen: Benign, Soft, non-tender. Renal:   No costovertebral tenderness  GU:  Not performed at this time. Endoc: No evident thyromegaly, no signs of acromegaly. Skin:   warm, no rash. Extremities: normal, no cyanosis, clubbing.   LABORATORY PANEL:   CBC  Recent Labs Lab 06/29/15 0603  WBC 5.9  HGB 13.4  HCT 39.2*  PLT 115*   ------------------------------------------------------------------------------------------------------------------  Chemistries   Recent Labs Lab 06/28/15 2008 06/29/15 0603  NA 131* 132*  K 3.8 3.7  CL 100* 104  CO2 25 22  GLUCOSE 118* 106*  BUN 16 12  CREATININE 1.09 0.85  CALCIUM 8.6* 7.3*  AST 28  --   ALT 25  --   ALKPHOS 74  --   BILITOT 1.2  --    ------------------------------------------------------------------------------------------------------------------  Cardiac Enzymes  Recent Labs Lab 06/28/15 2008  TROPONINI <0.03   ------------------------------------------------------------  RADIOLOGY:   No results found for this or any previous visit. Results for orders placed during the hospital encounter of 06/28/15  DG Chest 2 View   Narrative CLINICAL DATA:  Fever and cough for 1 day. Fall, right-sided numbness. Initial encounter.  EXAM: CHEST  2 VIEW  COMPARISON:  None.  FINDINGS: Trachea is midline. Heart size is accentuated by AP technique. There is diffuse interstitial prominence and indistinctness. No definite pleural fluid. Degenerative changes are seen in the spine.  IMPRESSION: Diffuse pulmonary parenchymal interstitial prominence and indistinctness may be due to atypical/viral pneumonia or pulmonary edema.   Electronically Signed   By: Leanna BattlesMelinda  Blietz M.D.   On: 06/28/2015 21:17    ------------------------------------------------------------------------------------------------------------------  Thank  you for allowing [facility] to assist  in the care of your patient. Our recommendations are noted above.  Please  contact us if we can be of further service.   Wells Guiles, MD.  [facility] Office Number: [phone]  Santiago Glad, M.D.  Stephanie Acre, M.D.  Billy Fischer, M.D  06/30/2015

## 2015-06-30 NOTE — Progress Notes (Signed)
Patient ID: Cervando Durnin, male   DOB: 1928-08-29, 82 y.o.   MRN: [id number] [facility] - [place] [facility]   PATIENT NAME: Adam Roberson    MR#:  [id number]  DATE OF BIRTH:  [birth year]  SUBJECTIVE:   Speaks farsi. Daughter in the room Breathing better. Low grade fever Denies any pain Ate some BF per family REVIEW OF SYSTEMS:   Review of Systems  Constitutional: Positive for fever and malaise/fatigue. Negative for chills and weight loss.  HENT: Negative for ear discharge, ear pain and nosebleeds.   Eyes: Negative for blurred vision, pain and discharge.  Respiratory: Positive for cough and shortness of breath. Negative for sputum production, wheezing and stridor.   Cardiovascular: Negative for chest pain, palpitations, orthopnea and PND.  Gastrointestinal: Negative for nausea, vomiting, abdominal pain and diarrhea.  Genitourinary: Negative for urgency and frequency.  Musculoskeletal: Positive for falls. Negative for back pain and joint pain.  Neurological: Positive for tremors and weakness. Negative for sensory change, speech change and focal weakness.  Psychiatric/Behavioral: Negative for depression and hallucinations. The patient is not nervous/anxious.   All other systems reviewed and are negative.  Tolerating Diet:yes Tolerating PT: pending  DRUG ALLERGIES:  No Known Allergies  VITALS:  Blood pressure 107/58, pulse 180, temperature 99 F (37.2 C), temperature source Oral, resp. rate 17, height 5' 2.99" (1.6 m), weight 70 kg (154 lb 5.2 oz), SpO2 98 %.  PHYSICAL EXAMINATION:   Physical Exam  GENERAL:  82 y.o.-year-old patient lying in the bed with no acute distress. Appears ill EYES: Pupils equal, round, reactive to light and accommodation. No scleral icterus. Extraocular muscles intact.  HEENT: Head atraumatic, normocephalic. Oropharynx and nasopharynx clear.  NECK:  Supple, no jugular venous distention. No thyroid enlargement, no  tenderness. Mild tachycardia LUNGS: Normal breath sounds bilaterally, no wheezing,bibasilar rales, rhonchi. No use of accessory muscles of respiration.  CARDIOVASCULAR: S1, S2 normal. No murmurs, rubs, or gallops. tachycardia ABDOMEN: Soft, nontender, nondistended. Bowel sounds present. No organomegaly or mass.  EXTREMITIES: No cyanosis, clubbing or edema b/l.    NEUROLOGIC: Cranial nerves II through XII are intact. No focal Motor or sensory deficits b/l.   PSYCHIATRIC:  patient is alert and oriented x 3.  SKIN: No obvious rash, lesion, or ulcer.   LABORATORY PANEL:  CBC  Recent Labs Lab 06/29/15 0603  WBC 5.9  HGB 13.4  HCT 39.2*  PLT 115*    Chemistries   Recent Labs Lab 06/28/15 2008 06/29/15 0603  NA 131* 132*  K 3.8 3.7  CL 100* 104  CO2 25 22  GLUCOSE 118* 106*  BUN 16 12  CREATININE 1.09 0.85  CALCIUM 8.6* 7.3*  AST 28  --   ALT 25  --   ALKPHOS 74  --   BILITOT 1.2  --    Cardiac Enzymes  Recent Labs Lab 06/28/15 2008  TROPONINI <0.03   RADIOLOGY:  Dg Chest 1 View  06/30/2015  CLINICAL DATA:  82 year old male with history of dyspnea. EXAM: CHEST 1 VIEW COMPARISON:  Chest x-ray 06/29/2015. FINDINGS: Status post median sternotomy for CABG. Lung volumes are low. Diffuse peribronchial cuffing. Diffuse interstitial prominence and patchy airspace disease throughout the lungs bilaterally, overall very similar to the prior examination. Small bilateral pleural effusions appear unchanged. Pulmonary vasculature does not appear engorged. Heart size is upper limits of normal. Upper mediastinal contours are distorted by patient's rotation to the right. Atherosclerosis in the thoracic aorta. IMPRESSION: 1. The appearance of the  chest remains most concerning for multilobar bronchopneumonia. 2. Small bilateral pleural effusions are unchanged. 3. Atherosclerosis. Electronically Signed   By: Trudie Reed M.D.   On: 06/30/2015 08:36   Dg Chest 1 View  06/29/2015  CLINICAL  DATA:  82 year old male with history of fever and weakness. EXAM: CHEST 1 VIEW COMPARISON:  Chest x-ray 06/28/2015. FINDINGS: Lung volumes are low. Diffuse interstitial prominence and patchy airspace opacities are noted throughout the lungs bilaterally, very similar to the prior examination. Small bilateral pleural effusions. Pulmonary vasculature does not appear engorged. Heart size is borderline enlarged, likely accentuated by low lung volumes and patient's rotation to the right which also distorts upper mediastinal contours. Atherosclerosis in the thoracic aorta. Status post median sternotomy. IMPRESSION: 1. Widespread interstitial and patchy airspace disease in the lungs bilaterally remains concerning for severe multilobar bronchopneumonia. 2. Atherosclerosis. Electronically Signed   By: Trudie Reed M.D.   On: 06/29/2015 15:33   Dg Chest 2 View  06/28/2015  CLINICAL DATA:  Fever and cough for 1 day. Fall, right-sided numbness. Initial encounter. EXAM: CHEST  2 VIEW COMPARISON:  None. FINDINGS: Trachea is midline. Heart size is accentuated by AP technique. There is diffuse interstitial prominence and indistinctness. No definite pleural fluid. Degenerative changes are seen in the spine. IMPRESSION: Diffuse pulmonary parenchymal interstitial prominence and indistinctness may be due to atypical/viral pneumonia or pulmonary edema. Electronically Signed   By: Leanna Battles M.D.   On: 06/28/2015 21:17   Ct Head Wo Contrast  06/28/2015  CLINICAL DATA:  Fall, weakness, right-sided numbness, bilateral lower extremity weakness, initial encounter. Parkinson's disease. On Coumadin. EXAM: CT HEAD WITHOUT CONTRAST TECHNIQUE: Contiguous axial images were obtained from the base of the skull through the vertex without intravenous contrast. COMPARISON:  None. FINDINGS: Likely remote lacunar infarcts in the basal ganglia. Otherwise, no evidence of an acute infarct, acute hemorrhage, mass lesion, mass effect or  hydrocephalus. Ventricular dilatation is likely in proportion to the degree of advanced atrophy. No fracture. Small amount of fluid in the left mastoid air cells. IMPRESSION: 1. No acute intracranial abnormality. 2. Atrophy. 3. Likely remote bilateral basal ganglia lacunar infarcts. 4. Left mastoid effusion. Electronically Signed   By: Leanna Battles M.D.   On: 06/28/2015 21:09   ASSESSMENT AND PLAN:  Adam Roberson is a 82 y.o. male with a known history of CAD s/p CABG, Parkinson disease, HTN, Hyperlipidemia, h/o DVT left leg on coumadin presets to the hospital for extreme weakness and fever.  Being admitted for sepsis secondary to pneumonia  #1 Sepsis- secondary to atypical bilateral pneumonia - f/u blood cultures are so far negative -Continue IV Zosyn and azithromycin Weakness and falls- physical therapy -Appreciate pulmonary input-pt has a very remote h/o smoking. -no h/o COPD -Improving slowly -Repeat chest shows bilateral bronchopneumonia  #2 Parkinson disease- resting tremors present, shuffling gait - cont carbidopa. Swallow eval- on soft diet now as no teeth  #3 CAD s/p CABG- stable, cont cardiac meds- asa, metoprolol, losartan Check ECHO with diffuse interstitial pattern on CXR to r/o CHF Hold statin with muscle weakness  #4 H/o left LLE clot- cont coumadin, pharmacy to adjust dose INR 2.9 today  #5 Hyponatremia- hypovolemic- IV fluids and monitor  #6 DVT Prophylaxis- already on coumadin  #7 tachycardia  secondary to #1   PT consulted.  D/w dter Case discussed with Care Management/Social Worker. Management plans discussed with the patient, family and they are in agreement.  CODE STATUS: full  DVT Prophylaxis:lovenox  TOTAL TIME  TAKING CARE OF THIS PATIENT:30 minutes.  >50% time spent on counselling and coordination of care  POSSIBLE D/C IN2-3 DAYS, DEPENDING ON CLINICAL CONDITION.  Note: This dictation was prepared with Dragon dictation along with smaller  phrase technology. Any transcriptional errors that result from this process are unintentional.  Acire Tang M.D on 06/30/2015 at 11:24 AM  Between 7am to 6pm - Pager - [phone]  After 6pm go to [email or web address] - password [email or web address] [facility]  Office  [phone]  CC: Primary care physician; [facility]

## 2015-07-01 DIAGNOSIS — J189 Pneumonia, unspecified organism: Secondary | ICD-10-CM

## 2015-07-01 DIAGNOSIS — I481 Persistent atrial fibrillation: Secondary | ICD-10-CM

## 2015-07-01 LAB — PROTIME-INR
INR: 3.38
PROTHROMBIN TIME: 33.5 s — AB (ref 11.4–15.0)

## 2015-07-01 MED ORDER — METOPROLOL TARTRATE 25 MG PO TABS
25.0000 mg | ORAL_TABLET | Freq: Two times a day (BID) | ORAL | Status: DC
  Administered 2015-07-01: 25 mg via ORAL
  Filled 2015-07-01: qty 1

## 2015-07-01 MED ORDER — AZITHROMYCIN 250 MG PO TABS
500.0000 mg | ORAL_TABLET | Freq: Every day | ORAL | Status: DC
  Administered 2015-07-01 – 2015-07-04 (×4): 500 mg via ORAL
  Filled 2015-07-01 (×4): qty 2

## 2015-07-01 NOTE — Evaluation (Signed)
Physical Therapy Evaluation Patient Details Name: Adam Roberson MRN: [id number] DOB: February 17, 1929 Today's Date: 07/01/2015   History of Present Illness  Adam Roberson is an 82yo Middle-Eastern Male originally from [place] (Farsi only) who comes to [facility] on 4/14 p sustained weakness and fever at home. PMH: CAD, s/p CABG, PD (~10y), HLD, DVT. Pt has no permanent residence, but lives with various children throughout the year both in [place] and [place]. Pt currently lives with daughter and 3 grandchildren, c 24/7 assistance, and  performs mostly household distance ambulation s AD. Family reports no history of falls. At baseline pt requires modA for bathing/dressing, toilets independently, and requires  assistance for self-feeding due to Parkinsonian tremor. History is collected at bedside from [name], Daughter, and Son-in-law.    Clinical Impression  Pt is a pleasant 82 y.o. M admitted to hospital for weakness and fever. Pt diagnosed with sepsis and pneumonia. Pt does not speak [place], interpreter used via language line for treatment. Prior to admission, pt lived at home with family members. Pt required assist with all ADLs. Pt demonstrates poor B UE strength and fair B LE strength. Pt able to perform bed mobility with min assist and use of bed rails. Pt able to transfer using RW and min assist. Pt able to ambulate approx 3 ft using RW and mod assist, requiring heavy cues with use of walker. Second person available during ambulation for safety. Recommend pt use RW at discharge for safety until strength and endurance improves.  Ambulation limited by pts HR. HR at 146 bpm upon sitting up, dropped to 107 bpm when sitting after ambulation. O2 sat remained at 90% on room air during all mobility. Pt demonstrates deficits in strength, balance, and mobility. Pt would benefit from further skilled PT to address deficits; recommend pt receive home health PT after discharge from acute hospitalization.     Follow Up  Recommendations Home health PT    Equipment Recommendations       Recommendations for Other Services       Precautions / Restrictions Precautions Precautions: Fall Restrictions Weight Bearing Restrictions: No      Mobility  Bed Mobility Overal bed mobility: Needs Assistance Bed Mobility: Supine to Sit     Supine to sit: Min assist     General bed mobility comments: Pt able to perform bed mobility with use of bed rails and min assist. Pt required assist with B LE. Pt provided heavy cues regarding hand placement during mobility.   Transfers Overall transfer level: Needs assistance Equipment used: Rolling walker (2 wheeled) Transfers: Sit to/from Stand Sit to Stand: Min assist         General transfer comment: Pt able to perform sit to stand from EOB using RW and 2+ min assist for safety. Pt impulsive and tried to stand prior to PTs instruction. Pt provided verbal and tactile cues regarding hand placement on walker. Pt required increased time to upright posture upon standing.   Ambulation/Gait Ambulation/Gait assistance: Mod assist Ambulation Distance (Feet): 3 Feet Assistive device: Rolling walker (2 wheeled) Gait Pattern/deviations: Step-to pattern;Narrow base of support Gait velocity: slow   General Gait Details: Pt able to ambulate approx. 3 ft using RW and mod assist for safety. Pt provided heavy verbal and tactile cues regarding walker and foot placement. Pt required assist with moving walker t/o ambulation. Pt demostrated shuffle gait with narrow BOS and short step length. Ambulation limited by pts increase in HR during mobility.   Stairs  Wheelchair Mobility    Modified Rankin (Stroke Patients Only)       Balance Overall balance assessment: Needs assistance Sitting-balance support: Bilateral upper extremity supported;Feet supported Sitting balance-Leahy Scale: Fair Sitting balance - Comments: Pt required use of railings and bed w/B UE  while sitting. Pt demostrates forward trunk lean while sitting.    Standing balance support: Bilateral upper extremity supported Standing balance-Leahy Scale: Poor Standing balance comment: Pt demonstrates poor standing balance requiring use of RW and mod assist. Pt required increased time once standing to upright posture and align feet under body.                              Pertinent Vitals/Pain Pain Assessment: Faces Faces Pain Scale: Hurts little more    Home Living Family/patient expects to be discharged to:: Private residence Living Arrangements: Children Available Help at Discharge: Family Type of Home: House         Home Equipment: Dan HumphreysWalker - 2 wheels Additional Comments: Pt lives with daughter(s), daughter stated he has been able to get in/out of home. Unsure which daughter's hosue he would go to at discharge.     Prior Function Level of Independence: Needs assistance   Gait / Transfers Assistance Needed: Pt required assist with ambulation. Daughter stated he has RW but does not use it. Pt only ambulates short distances in home.  ADL's / Homemaking Assistance Needed: Needs assistance w/all ADLs  Comments: Pt required assist w/all ADLs. Family available 24/7 to help.     Hand Dominance        Extremity/Trunk Assessment   Upper Extremity Assessment: RUE deficits/detail;LUE deficits/detail RUE Deficits / Details: R UE grossly 3+/5 strength, good shoulder flexion ROM     LUE Deficits / Details: L UE grossly 3+/5, good shoulder flex ROM   Lower Extremity Assessment: RLE deficits/detail;LLE deficits/detail RLE Deficits / Details: R LE grossly 4/5, able to bed knee w/o assist. LLE Deficits / Details: L LE grossly 4/5, able to bed knee w/o assist     Communication   Communication: Interpreter utilized;Prefers language other than English (#[id number]  Cognition Arousal/Alertness: Lethargic Behavior During Therapy: WFL for tasks assessed/performed Overall  Cognitive Status: Within Functional Limits for tasks assessed                      General Comments      Exercises        Assessment/Plan    PT Assessment Patient needs continued PT services  PT Diagnosis Difficulty walking;Generalized weakness   PT Problem List Decreased strength;Decreased activity tolerance;Decreased balance;Decreased mobility;Decreased knowledge of use of DME  PT Treatment Interventions DME instruction;Gait training;Therapeutic exercise;Therapeutic activities;Balance training   PT Goals (Current goals can be found in the Care Plan section) Acute Rehab PT Goals Patient Stated Goal: to return home PT Goal Formulation: Patient unable to participate in goal setting Time For Goal Achievement: 07/15/15 Potential to Achieve Goals: Good    Frequency Min 2X/week   Barriers to discharge        Co-evaluation               End of Session Equipment Utilized During Treatment: Gait belt Activity Tolerance: Patient limited by fatigue Patient left: in chair;with call bell/phone within reach;with chair alarm set;with family/visitor present Nurse Communication: Mobility status         Time: 1043-1106 PT Time Calculation (min) (ACUTE ONLY): 23 min   Charges:  PT G Codes:        Dorita Fray 02-Jul-2015, 11:49 AM M. Hettie Holstein, SPT

## 2015-07-01 NOTE — Progress Notes (Signed)
No new complaints No distress  Filed Vitals:   07/01/15 0532 07/01/15 0600 07/01/15 0930 07/01/15 1320  BP: 96/53  119/81 101/76  Pulse: 56 80 108 32  Temp: 98.3 F (36.8 C)  99.1 F (37.3 C) 98.8 F (37.1 C)  TempSrc: Oral  Oral Oral  Resp: 18  20 20   Height:      Weight:      SpO2: 100% 100% 95% 95%   NAD on RA HEENT WNL No JVD noted BS diminished without adventitious sounds IRIR, soft systolic M NABS, soft No edema No focal neurological deficits  BMP Latest Ref Rng 06/29/2015 06/28/2015 05/15/2015  Glucose 65 - 99 mg/dL 161(W106(H) 960(A118(H) 540(J141(H)  BUN 6 - 20 mg/dL 12 16 14   Creatinine 0.61 - 1.24 mg/dL 8.110.85 9.141.09 7.820.93  BUN/Creat Ratio 10 - 22 - - 15  Sodium 135 - 145 mmol/L 132(L) 131(L) 144  Potassium 3.5 - 5.1 mmol/L 3.7 3.8 4.0  Chloride 101 - 111 mmol/L 104 100(L) 103  CO2 22 - 32 mmol/L 22 25 24   Calcium 8.9 - 10.3 mg/dL 7.3(L) 8.6(L) 8.7    CBC Latest Ref Rng 06/29/2015 06/28/2015 05/15/2015  WBC 3.8 - 10.6 K/uL 5.9 6.1 5.7  Hemoglobin 13.0 - 18.0 g/dL 95.613.4 21.314.7 -  Hematocrit 40.0 - 52.0 % 39.2(L) 43.3 47.6  Platelets 150 - 440 K/uL 115(L) 137(L) 164    CXR: (06/30/15) diffuse interstitial prominence - improved since 04/14 EKG: (06/28/15) Atrial fibrillation  IMPRESSION: 1) admitted with dx of atypical PNA - seemingly improved  2) Atrial fibrillation of uncertain chronicity   PLAN/REC: 1) DC pip-tazo 2) Complete 5 day course of azithromycin 3) Recheck CXR in AM 04/18 4) Recheck EKG now 5) Cardiology consultation requested  Billy Fischeravid Simonds, MD PCCM service Mobile [phone] Pager [phone] 07/01/2015

## 2015-07-01 NOTE — Progress Notes (Signed)
ANTICOAGULATION CONSULT NOTE - Follow up Consult  Pharmacy Consult for warfarin Indication:  History of DVT  No Known Allergies  Patient Measurements: Height: 5' 2.99" (160 cm) Weight: 154 lb 5.2 oz (70 kg) IBW/kg (Calculated) : 56.88  Vital Signs: Temp: 98.3 F (36.8 C) (04/17 0532) Temp Source: Oral (04/17 0532) BP: 96/53 mmHg (04/17 0532) Pulse Rate: 80 (04/17 0600)  Labs:  Recent Labs  06/28/15 2008 06/29/15 0603 06/30/15 0528 07/01/15 0508  HGB 14.7 13.4  --   --   HCT 43.3 39.2*  --   --   PLT 137* 115*  --   --   LABPROT 30.3* 29.5* 29.5* 33.5*  INR 2.96 2.86 2.86 3.38  CREATININE 1.09 0.85  --   --   TROPONINI <0.03  --   --   --     Estimated Creatinine Clearance: 55.8 mL/min (by C-G formula based on Cr of 0.85).   Medical History: Past Medical History  Diagnosis Date  . Myocardial infarction [facility](HCC)     s/p CABG  . Parkinson disease (HCC)   . Hypertension   . Hyperlipidemia   . DVT of leg (deep venous thrombosis) (HCC)   . H/O: CVA (cerebrovascular accident)     Assessment: Pharmacy consulted to dose and monitor warfarin in this 82 year old male who was taking warfarin prior to admission for a history of DVT. His home regimen is warfarin 5 mg PO daily except 2.5 mg on M&F.   4/14 INR 2.96, Warfarin 2.5mg  4/15 INR 2.86, Warfarin 5 mg 4/16 INR 2.86; 5 mg  4/17: INR: 3.38;   Goal of Therapy:  INR 2-3 Monitor platelets by anticoagulation protocol: Yes   Plan:   Will hold today's dose of warfarin. Will reassess INR with am labs. Pharmacy to follow and adjust dose per consult.   Demetrius CharityJames,Torris House D, RPh Clinical Pharmacist 07/01/2015,8:20 AM

## 2015-07-01 NOTE — Care Management (Signed)
Admitted to [facility] with the diagnosis of sepsis. Lives with wife. Daughters are Ghofrany [phone]) and Elham [phone]). Last seen at the [facility] 05/15/2015. Gets all medications from thr [facility] except Parkinson's  medication. Gets this medication from [facility]  [address].   Wife helps with activities of daily living. Family will transport. Discharge tomorrow per Dr. Elpidio AnisSudini. Gwenette GreetBrenda S Marquet Faircloth RN MSN CCM Care Management [phone]

## 2015-07-01 NOTE — Progress Notes (Signed)
Speech Therapy Note: reviewed chart notes; consulted NSG who has had pt under her care this admission. Pt appears to be tolerating his current Dys. 2 diet following aspiration precautions; meds in puree. No reported decline in respiratory status per NSG or MD today. Rec. Continue w/ current diet as ordered w/ aspiration precautions; meds in puree. ST will be available for further education as needed during admission as pt is at increased risk for aspiration d/t baseline Parkinson's Dis; cognitive status; deconditioning overall.

## 2015-07-01 NOTE — Progress Notes (Signed)
Patient ID: Adam Roberson, male   DOB: 12/04/1928, 82 y.o.   MRN: [id number] [facility] -  at [facility]   PATIENT NAME: Adam Roberson    MR#:  [id number]  DATE OF BIRTH:  December 28, 1928  SUBJECTIVE:   Speaks farsi. Used speaker phone with Interpreter on phone.  SOB better. Some cough. Weak. Daughter at bedside.  REVIEW OF SYSTEMS:   Review of Systems  Constitutional: Positive for fever and malaise/fatigue. Negative for chills and weight loss.  HENT: Negative for ear discharge, ear pain and nosebleeds.   Eyes: Negative for blurred vision, pain and discharge.  Respiratory: Positive for cough and shortness of breath. Negative for sputum production, wheezing and stridor.   Cardiovascular: Negative for chest pain, palpitations, orthopnea and PND.  Gastrointestinal: Negative for nausea, vomiting, abdominal pain and diarrhea.  Genitourinary: Negative for urgency and frequency.  Musculoskeletal: Positive for falls. Negative for back pain and joint pain.  Neurological: Positive for tremors and weakness. Negative for sensory change, speech change and focal weakness.  Psychiatric/Behavioral: Negative for depression and hallucinations. The patient is not nervous/anxious.   All other systems reviewed and are negative.  Tolerating Diet:yes Tolerating PT: pending  DRUG ALLERGIES:  No Known Allergies  VITALS:  Blood pressure 101/76, pulse 32, temperature 98.8 F (37.1 C), temperature source Oral, resp. rate 20, height 5' 2.99" (1.6 m), weight 70 kg (154 lb 5.2 oz), SpO2 95 %.  PHYSICAL EXAMINATION:   Physical Exam  GENERAL:  82 y.o.-year-old patient lying in the bed with no acute distress. Appears ill EYES: Pupils equal, round, reactive to light and accommodation. No scleral icterus. Extraocular muscles intact.  HEENT: Head atraumatic, normocephalic. Oropharynx and nasopharynx clear.  NECK:  Supple, no jugular venous distention. No thyroid  enlargement, no tenderness. Mild tachycardia LUNGS: Normal breath sounds bilaterally, no wheezing,bibasilar rales, rhonchi. No use of accessory muscles of respiration.  CARDIOVASCULAR: S1, S2 normal. No murmurs, rubs, or gallops. tachycardia ABDOMEN: Soft, nontender, nondistended. Bowel sounds present. No organomegaly or mass.  EXTREMITIES: No cyanosis, clubbing or edema b/l.    NEUROLOGIC: Cranial nerves II through XII are intact. No focal Motor or sensory deficits b/l.   PSYCHIATRIC:  patient is alert and oriented x 3.  SKIN: No obvious rash, lesion, or ulcer.   LABORATORY PANEL:  CBC  Recent Labs Lab 06/29/15 0603  WBC 5.9  HGB 13.4  HCT 39.2*  PLT 115*    Chemistries   Recent Labs Lab 06/28/15 2008 06/29/15 0603  NA 131* 132*  K 3.8 3.7  CL 100* 104  CO2 25 22  GLUCOSE 118* 106*  BUN 16 12  CREATININE 1.09 0.85  CALCIUM 8.6* 7.3*  AST 28  --   ALT 25  --   ALKPHOS 74  --   BILITOT 1.2  --    Cardiac Enzymes  Recent Labs Lab 06/28/15 2008  TROPONINI <0.03   RADIOLOGY:  Dg Chest 1 View  06/30/2015  CLINICAL DATA:  82 year old male with history of dyspnea. EXAM: CHEST 1 VIEW COMPARISON:  Chest x-ray 06/29/2015. FINDINGS: Status post median sternotomy for CABG. Lung volumes are low. Diffuse peribronchial cuffing. Diffuse interstitial prominence and patchy airspace disease throughout the lungs bilaterally, overall very similar to the prior examination. Small bilateral pleural effusions appear unchanged. Pulmonary vasculature does not appear engorged. Heart size is upper limits of normal. Upper mediastinal contours are distorted by patient's rotation to the right. Atherosclerosis in the thoracic aorta. IMPRESSION: 1. The appearance of the  chest remains most concerning for multilobar bronchopneumonia. 2. Small bilateral pleural effusions are unchanged. 3. Atherosclerosis. Electronically Signed   By: Trudie Reedaniel  Entrikin M.D.   On: 06/30/2015 08:36   ASSESSMENT AND PLAN:   Adam Roberson is a 82 y.o. male with a known history of CAD s/p CABG, Parkinson disease, HTN, Hyperlipidemia, h/o DVT left leg on coumadin presets to the hospital for extreme weakness and fever.  Being admitted for sepsis secondary to pneumonia  #1 Sepsis- secondary to atypical bilateral pneumonia and Acute hypoxic resp failure - Blood cx negative -Continue azithromycin D/C Zosyn Weakness and falls- physical therapy -Appreciate pulmonary input-pt has a very remote h/o smoking. -no h/o COPD -Repeat chest showed bilateral bronchopneumonia  #2 Parkinson disease- resting tremors present, shuffling gait - cont carbidopa. Swallow eval- on soft diet now as no teeth  #3 CAD s/p CABG- stable, cont cardiac meds- asa, metoprolol, losartan Check ECHO - EF 60%  #4 H/o left LLE clot- cont coumadin, pharmacy to adjust dose  #5 Hyponatremia- hypovolemic- IV fluids and monitor  #6 DVT Prophylaxis- already on coumadin  #7 tachycardia  secondary to #1  PT consulted.  D/w dter Case discussed with Care Management/Social Worker. Management plans discussed with the patient, family and they are in agreement.  CODE STATUS: full  TOTAL TIME TAKING CARE OF THIS PATIENT:35 minutes.   POSSIBLE D/C IN2-3 DAYS, DEPENDING ON CLINICAL CONDITION.  Note: This dictation was prepared with Dragon dictation along with smaller phrase technology. Any transcriptional errors that result from this process are unintentional.  Milagros LollSudini, Kamareon Sciandra R M.D on 07/01/2015 at 4:31 PM  Between 7am to 6pm - Pager - [phone]  After 6pm go to [email or web address] - password [email or web address] [facility]  [facility]  Office  [phone]  CC: Primary care physician; [facility]

## 2015-07-01 NOTE — Progress Notes (Signed)
PHARMACIST - PHYSICIAN COMMUNICATION DR:   Kalisetti CONCERNING: Antibiotic IV to Oral Route Change Policy  RECOMMENDATION: This patient is receiving Azithromycin by the intravenous route.  Based on criteria approved by the Pharmacy and Therapeutics Committee, the antibiotic(s) is/are being converted to the equivalent oral dose form(s).   DESCRIPTION: These criteria include:  Patient being treated for a respiratory tract infection, urinary tract infection, cellulitis or clostridium difficile associated diarrhea if on metronidazole  The patient is not neutropenic and does not exhibit a GI malabsorption state  The patient is eating (either orally or via tube) and/or has been taking other orally administered medications for a least 24 hours  The patient is improving clinically and has a Tmax < 100.5  If you have questions about this conversion, please contact the Pharmacy Department  []  ( [phone] )  [place] [x]  ( [phone] )  [facility] []  ( [phone]  [place] []  ( [phone]  [facility] []  ( [phone] )  [facility]    Mann Skaggs D. Kymani Shimabukuro, PharmD  

## 2015-07-02 ENCOUNTER — Inpatient Hospital Stay: Payer: Medicaid Other

## 2015-07-02 DIAGNOSIS — I5033 Acute on chronic diastolic (congestive) heart failure: Secondary | ICD-10-CM

## 2015-07-02 DIAGNOSIS — G20 Parkinson's disease: Secondary | ICD-10-CM

## 2015-07-02 DIAGNOSIS — J189 Pneumonia, unspecified organism: Secondary | ICD-10-CM

## 2015-07-02 DIAGNOSIS — I4819 Other persistent atrial fibrillation: Secondary | ICD-10-CM | POA: Diagnosis present

## 2015-07-02 DIAGNOSIS — R0600 Dyspnea, unspecified: Secondary | ICD-10-CM

## 2015-07-02 LAB — PROTIME-INR
INR: 3.86
Prothrombin Time: 37 seconds — ABNORMAL HIGH (ref 11.4–15.0)

## 2015-07-02 MED ORDER — FUROSEMIDE 10 MG/ML IJ SOLN: 40.0000 mg | Freq: Once | INTRAMUSCULAR | Status: DC

## 2015-07-02 MED ORDER — FUROSEMIDE 10 MG/ML IJ SOLN
40.0000 mg | Freq: Two times a day (BID) | INTRAMUSCULAR | Status: DC
  Administered 2015-07-02 – 2015-07-05 (×7): 40 mg via INTRAVENOUS
  Filled 2015-07-02 (×7): qty 4

## 2015-07-02 MED ORDER — WARFARIN SODIUM 4 MG PO TABS: 2.0000 mg | ORAL_TABLET | Freq: Once | ORAL | Status: DC

## 2015-07-02 MED ORDER — METOPROLOL TARTRATE 25 MG PO TABS: 12.5000 mg | ORAL_TABLET | Freq: Once | ORAL | Status: DC

## 2015-07-02 MED ORDER — DIGOXIN 0.25 MG/ML IJ SOLN
0.5000 mg | Freq: Once | INTRAMUSCULAR | Status: AC
  Administered 2015-07-02: 0.5 mg via INTRAVENOUS
  Filled 2015-07-02: qty 2

## 2015-07-02 MED ORDER — WARFARIN SODIUM 2.5 MG PO TABS: 2.5000 mg | ORAL_TABLET | Freq: Once | ORAL | Status: DC

## 2015-07-02 MED ORDER — POTASSIUM CHLORIDE 20 MEQ PO PACK: 40.0000 meq | PACK | Freq: Once | ORAL | Status: DC

## 2015-07-02 MED ORDER — METOPROLOL TARTRATE 25 MG PO TABS
37.5000 mg | ORAL_TABLET | Freq: Two times a day (BID) | ORAL | Status: DC
  Administered 2015-07-02 (×2): 37.5 mg via ORAL
  Filled 2015-07-02 (×2): qty 2

## 2015-07-02 MED ORDER — POTASSIUM CHLORIDE CRYS ER 20 MEQ PO TBCR
40.0000 meq | EXTENDED_RELEASE_TABLET | Freq: Two times a day (BID) | ORAL | Status: DC
  Administered 2015-07-02 (×2): 40 meq via ORAL
  Filled 2015-07-02 (×2): qty 2

## 2015-07-02 NOTE — Progress Notes (Addendum)
ANTICOAGULATION CONSULT NOTE - Follow up Consult  Pharmacy Consult for warfarin Indication:  History of DVT  No Known Allergies  Patient Measurements: Height: 5' 2.99" (160 cm) Weight: 154 lb 5.2 oz (70 kg) IBW/kg (Calculated) : 56.88  Vital Signs: Temp: 97.5 F (36.4 C) (04/18 0506) Temp Source: Oral (04/18 0506) BP: 111/71 mmHg (04/18 0506) Pulse Rate: 76 (04/18 0506)  Labs:  Recent Labs  06/30/15 0528 07/01/15 0508 07/02/15 0508  LABPROT 29.5* 33.5* 37.0*  INR 2.86 3.38 3.86    Estimated Creatinine Clearance: 55.8 mL/min (by C-G formula based on Cr of 0.85).   Medical History: Past Medical History  Diagnosis Date  . Myocardial infarction [facility](HCC)     s/p CABG  . Parkinson disease (HCC)   . Hypertension   . Hyperlipidemia   . DVT of leg (deep venous thrombosis) (HCC)   . H/O: CVA (cerebrovascular accident)     Assessment: Pharmacy consulted to dose and monitor warfarin in this 82 year old male who was taking warfarin prior to admission for a history of DVT. His home regimen is warfarin 5 mg PO daily except 2.5 mg on M&F.   4/14 INR 2.96, Warfarin 2.5mg  4/15 INR 2.86, Warfarin 5 mg 4/16 INR 2.86; 5 mg  4/17: INR: 3.38; warfarin held 4/18 INR: 3.86,  Goal of Therapy:  INR 2-3 Monitor platelets by anticoagulation protocol: Yes   Plan:   Warfarin held yesterday, INR still trending up. Suspect due to acute illness and interaction with azithromycin, will continue to hold coumadin and recheck INR tomorrow AM. Pharmacy to follow and adjust dose per consult.   Garlon HatchetJody Arbutus Nelligan, PharmD Clinical Pharmacist   07/02/2015,7:51 AM

## 2015-07-02 NOTE — Progress Notes (Signed)
[facility]     Assessment and Plan:  Acute hypoxic respiratory failure. -Etiology is suspected pneumonia, the patient has made improvement, he is currently 95% on room air, whereas he was on 5-6 L when I first evaluated him.  Pneumonia. -MRSA PCR negative.  -Continue course of azithromycin.   Most recent CXR film reviewed today , shows continued right lower lobe atelectasis with an elevated diaphragm, minimally changed from previous.  Interstitial edema. -Possible pulmonary edema seen on chest x-ray, review of echocardiogram results shows pulmonary hypertension with a PAP of46, ejection fraction of 60%.   Parkinson's disease. -Presence dementia increases the risks of complications, particularly in light of the patient's pneumonia.  The patient appears stable from respiratory standpoint, pulmonary service will sign off for now, please call if there are any further questions or concerns.   Date: 07/02/2015  MRN# [id number] Adam Roberson [birth year]   Adam Roberson Adam Roberson is a 82 y.o. old male seen in follow up for chief complaint of  Chief Complaint  Patient presents with  . Fall  . Weakness     HPI:   Patient is minimally verbal, no new complaints today.  Allergies:  Review of patient's allergies indicates no known allergies.  Review of Systems: Cannot provide review of systems due to Parkinson's disease, nonverbal.  Physical Examination:   VS: BP 113/55 mmHg  Pulse 75  Temp(Src) 97.7 F (36.5 C) (Oral)  Resp 16  Ht 5' 2.99" (1.6 m)  Wt 154 lb 5.2 oz (70 kg)  BMI 27.34 kg/m2  SpO2 95%  General Appearance: No distress  Neuro:without focal findings,   HEENT: PERRLA, EOM intact. Pulmonary: normal breath sounds, No wheezing. Decreased air entry in the right base.  CardiovascularNormal S1,S2.  No m/r/g.   Abdomen: Benign, Soft, non-tender. Renal:  No costovertebral tenderness  GU:  Not performed at this time. Endoc: No evident  thyromegaly, no signs of acromegaly. Skin:   warm, no rash. Extremities: normal, no cyanosis, clubbing.   LABORATORY PANEL:   CBC  Recent Labs Lab 06/29/15 0603  WBC 5.9  HGB 13.4  HCT 39.2*  PLT 115*   ------------------------------------------------------------------------------------------------------------------  Chemistries   Recent Labs Lab 06/28/15 2008 06/29/15 0603  NA 131* 132*  K 3.8 3.7  CL 100* 104  CO2 25 22  GLUCOSE 118* 106*  BUN 16 12  CREATININE 1.09 0.85  CALCIUM 8.6* 7.3*  AST 28  --   ALT 25  --   ALKPHOS 74  --   BILITOT 1.2  --    ------------------------------------------------------------------------------------------------------------------  Cardiac Enzymes  Recent Labs Lab 06/28/15 2008  TROPONINI <0.03   ------------------------------------------------------------  RADIOLOGY:   No results found for this or any previous visit. Results for orders placed during the hospital encounter of 06/28/15  DG Chest 2 View   Narrative CLINICAL DATA:  Fever and cough for 1 day. Fall, right-sided numbness. Initial encounter.  EXAM: CHEST  2 VIEW  COMPARISON:  None.  FINDINGS: Trachea is midline. Heart size is accentuated by AP technique. There is diffuse interstitial prominence and indistinctness. No definite pleural fluid. Degenerative changes are seen in the spine.  IMPRESSION: Diffuse pulmonary parenchymal interstitial prominence and indistinctness may be due to atypical/viral pneumonia or pulmonary edema.   Electronically Signed   By: Leanna BattlesMelinda  Blietz M.D.   On: 06/28/2015 21:17    ------------------------------------------------------------------------------------------------------------------  Thank  you for allowing [facility] to assist in the care of your patient. Our recommendations are  noted above.  Please contact us if we can be of further service.   Wells Guiles, MD.  [place]  [facility] Office Number: [phone]  Santiago Glad, M.D.  Stephanie Acre, M.D.  Billy Fischer, M.D  07/02/2015

## 2015-07-02 NOTE — Consult Note (Signed)
Cardiology Consultation Note  Patient ID: Adam Roberson, MRN: [id number], DOB/AGE: 09/18/1928 82 y.o. Admit date: 06/28/2015   Date of Consult: 07/02/2015 Primary Physician: [facility] Primary Cardiologist: new to [facility]  Chief Complaint: Fall.weakness Reason for Consult: Afib with RVR/question of chronicity   HPI: 82 y.o. male from [place], who speaks only [place], with h/o CAD s/p 3 vessel CABG in 2000, possible persistent Afib though family is uncertain about the chronicity, on warfarin x at least 2 years (12-lead from 12/21/2011 shows Afib, 12-lead from 12/22/2011 shows sinus rhythm), chronic diastolic CHF, Parkinson's disease, HTN, HLD, and history of left lower extremity DVT on warfarin as above who presented to [facility] on 4/14 with generalized weakness and fever of 99.3, was found to be septic in the setting of atypical PNA as well as in Afib upon presentation on 12-lead EKG. Cardiology is consulted for further recommendations regarding the patient's Afib.   Reportedly, patient last had an ischemic evaluation 3 years ago leading to remote PCI. Cath data is not available at this time as this occurred in [place]. Family believes the patient has been in Afib for at least 2 years. He has been on warfarin for at least 2 years they report. He has been in the [place] for 1 year. He does not have a cardiologist here locally. He has not been experiencing any orthopnea, PND, or early satiety. He presented to [facility] on 4/14 with increased weakness s/p mechanical fall. He apparently was seen by his PCP 1 month prior, tested negative for influenza, and was placed on a Z pack. His symptoms improved. However, over the past several days he began to feel weaker and weaker. Subjective fevers. No chest pain, nausea, or vomiting.   Upon the patient's arrival to [facility] they were found to have troponin negative x 1, blood cultures negative x 2 to date, negative influenza A&B, BNP 154, Na 131. ECG on 06/28/15 showed Afib, 98  bpm, baseline wandering leads, no acute st/t changes, EKG on 07/01/15 showed Afib with RVR, 105 bpm, no acute st/t changes, CXR 4/14 showed likely atypical PNA vs pulmonary edema, CXR 4/15 with widespread interstitial and patchy airspace disease concerning for severe multilobar PNA, CXR 4/16 showed multilobar bronchoPNA, small bilateral effusions, CXR 4/18 showed progression of diffuse bilateral airspace disease c/w CHF with progressive edema. Underlying PNA not excluded. He was initially treated with Zosyn, though has been changed to azithromycin. Echo showed EF 60-65%, RWMA could not be excluded, LA was moderately dilated, RV systolic function was normal, mild to moderate TR, PASP 46 mm Hg. While walking with PT on 4/18 he was found to be tachycardic. EKG was checked and found to be in Afib with RVR. Cardiology is consulted. History is taken from patient's daughter and wife who translated.   Past Medical History  Diagnosis Date  . Myocardial infarction [facility])     s/p CABG  . Parkinson disease (HCC)   . Hypertension   . Hyperlipidemia   . DVT of leg (deep venous thrombosis) (HCC)   . H/O: CVA (cerebrovascular accident)       Most Recent Cardiac Studies: As above   Surgical History:  Past Surgical History  Procedure Laterality Date  . Cardiac surgery  age 72    bypass     Home Meds: Prior to Admission medications   Medication Sig Start Date End Date Taking? Authorizing Provider  aspirin 81 MG tablet Take 81 mg by mouth daily.   Yes Historical Provider,  MD  atorvastatin (LIPITOR) 20 MG tablet Take 20 mg by mouth daily.   Yes Historical Provider, MD  carbidopa-levodopa (SINEMET IR) 25-250 MG tablet Take 1 tablet by mouth 3 (three) times daily.   Yes Historical Provider, MD  losartan (COZAAR) 25 MG tablet Take 25 mg by mouth 2 (two) times daily.   Yes Historical Provider, MD  metoprolol (LOPRESSOR) 50 MG tablet Take 25 mg by mouth 2 (two) times daily. Takes 1/4 in the am and 1/4 in the pm    Yes Historical Provider, MD  nitroGLYCERIN 2.5 MG CR capsule Take 2.5 mg by mouth 2 (two) times daily.   Yes Historical Provider, MD  warfarin (COUMADIN) 5 MG tablet Take 5 mg by mouth daily. Takes 1/2 tab Monday and Friday Takes 1 tab T, W, Th, Sat, Sun   Yes Historical Provider, MD    Inpatient Medications:  . atorvastatin  20 mg Oral Daily  . azithromycin  500 mg Oral Daily  . carbidopa-levodopa  1 tablet Oral TID  . docusate sodium  100 mg Oral BID  . metoprolol tartrate  25 mg Oral BID  . warfarin  2.5 mg Oral ONCE-1800  . Warfarin - Pharmacist Dosing Inpatient   Does not apply q1800      Allergies: No Known Allergies  Social History   Social History  . Marital Status: Unknown    Spouse Name: N/A  . Number of Children: N/A  . Years of Education: N/A   Occupational History  . Not on file.   Social History Main Topics  . Smoking status: Never Smoker   . Smokeless tobacco: Not on file  . Alcohol Use: No  . Drug Use: No  . Sexual Activity: Not on file   Other Topics Concern  . Not on file   Social History Narrative   Lives at home with family.     Family History  Problem Relation Age of Onset  . CAD Mother      Review of Systems: Review of Systems  Unable to perform ROS: language    Labs: No results for input(s): CKTOTAL, CKMB, TROPONINI in the last 72 hours. Lab Results  Component Value Date   WBC 5.9 06/29/2015   HGB 13.4 06/29/2015   HCT 39.2* 06/29/2015   MCV 93.2 06/29/2015   PLT 115* 06/29/2015    Recent Labs Lab 06/28/15 2008 06/29/15 0603  NA 131* 132*  K 3.8 3.7  CL 100* 104  CO2 25 22  BUN 16 12  CREATININE 1.09 0.85  CALCIUM 8.6* 7.3*  PROT 7.5  --   BILITOT 1.2  --   ALKPHOS 74  --   ALT 25  --   AST 28  --   GLUCOSE 118* 106*   Lab Results  Component Value Date   CHOL 144 05/15/2015   HDL 41 05/15/2015   LDLCALC 78 05/15/2015   TRIG 123 05/15/2015   No results found for: DDIMER  Radiology/Studies:  Dg Chest 1  View  06/30/2015  CLINICAL DATA:  82 year old male with history of dyspnea. EXAM: CHEST 1 VIEW COMPARISON:  Chest x-ray 06/29/2015. FINDINGS: Status post median sternotomy for CABG. Lung volumes are low. Diffuse peribronchial cuffing. Diffuse interstitial prominence and patchy airspace disease throughout the lungs bilaterally, overall very similar to the prior examination. Small bilateral pleural effusions appear unchanged. Pulmonary vasculature does not appear engorged. Heart size is upper limits of normal. Upper mediastinal contours are distorted by patient's rotation to the right.  Atherosclerosis in the thoracic aorta. IMPRESSION: 1. The appearance of the chest remains most concerning for multilobar bronchopneumonia. 2. Small bilateral pleural effusions are unchanged. 3. Atherosclerosis. Electronically Signed   By: Trudie Reed M.D.   On: 06/30/2015 08:36   Dg Chest 1 View  06/29/2015  CLINICAL DATA:  82 year old male with history of fever and weakness. EXAM: CHEST 1 VIEW COMPARISON:  Chest x-ray 06/28/2015. FINDINGS: Lung volumes are low. Diffuse interstitial prominence and patchy airspace opacities are noted throughout the lungs bilaterally, very similar to the prior examination. Small bilateral pleural effusions. Pulmonary vasculature does not appear engorged. Heart size is borderline enlarged, likely accentuated by low lung volumes and patient's rotation to the right which also distorts upper mediastinal contours. Atherosclerosis in the thoracic aorta. Status post median sternotomy. IMPRESSION: 1. Widespread interstitial and patchy airspace disease in the lungs bilaterally remains concerning for severe multilobar bronchopneumonia. 2. Atherosclerosis. Electronically Signed   By: Trudie Reed M.D.   On: 06/29/2015 15:33   Dg Chest 2 View  07/02/2015  CLINICAL DATA:  Pulmonary infiltrates. EXAM: CHEST  2 VIEW COMPARISON:  06/30/2015 FINDINGS: Postop median sternotomy.  Mild cardiac enlargement.  Progression of bilateral airspace disease which may represent interstitial edema. Small bilateral pleural effusions. Mild right lower lobe atelectasis. IMPRESSION: Progression of diffuse bilateral airspace disease. Findings are suggestive of congestive heart failure with progressive edema. Underlying pneumonia not excluded. Electronically Signed   By: Marlan Palau M.D.   On: 07/02/2015 08:30   Dg Chest 2 View  06/28/2015  CLINICAL DATA:  Fever and cough for 1 day. Fall, right-sided numbness. Initial encounter. EXAM: CHEST  2 VIEW COMPARISON:  None. FINDINGS: Trachea is midline. Heart size is accentuated by AP technique. There is diffuse interstitial prominence and indistinctness. No definite pleural fluid. Degenerative changes are seen in the spine. IMPRESSION: Diffuse pulmonary parenchymal interstitial prominence and indistinctness may be due to atypical/viral pneumonia or pulmonary edema. Electronically Signed   By: Leanna Battles M.D.   On: 06/28/2015 21:17   Ct Head Wo Contrast  06/28/2015  CLINICAL DATA:  Fall, weakness, right-sided numbness, bilateral lower extremity weakness, initial encounter. Parkinson's disease. On Coumadin. EXAM: CT HEAD WITHOUT CONTRAST TECHNIQUE: Contiguous axial images were obtained from the base of the skull through the vertex without intravenous contrast. COMPARISON:  None. FINDINGS: Likely remote lacunar infarcts in the basal ganglia. Otherwise, no evidence of an acute infarct, acute hemorrhage, mass lesion, mass effect or hydrocephalus. Ventricular dilatation is likely in proportion to the degree of advanced atrophy. No fracture. Small amount of fluid in the left mastoid air cells. IMPRESSION: 1. No acute intracranial abnormality. 2. Atrophy. 3. Likely remote bilateral basal ganglia lacunar infarcts. 4. Left mastoid effusion. Electronically Signed   By: Leanna Battles M.D.   On: 06/28/2015 21:09    EKG: Afib, 98 bpm, baseline wandering leads, no acute st/t changes, EKG  on 07/01/15 showed Afib with RVR, 105 bpm, no acute st/t changes  Weights: Filed Weights   06/28/15 1953  Weight: 154 lb 5.2 oz (70 kg)     Physical Exam: Blood pressure 111/71, pulse 76, temperature 97.5 F (36.4 C), temperature source Oral, resp. rate 18, height 5' 2.99" (1.6 m), weight 154 lb 5.2 oz (70 kg), SpO2 93 %. Body mass index is 27.34 kg/(m^2). General: Frail appearing, in no acute distress. Head: Normocephalic, atraumatic, sclera non-icteric, no xanthomas, nares are without discharge.  Neck: Negative for carotid bruits. JVD not elevated. Lungs: Clear bilaterally to auscultation without  wheezes, rales, or rhonchi. Breathing is unlabored. Heart: Irregularly-irregular, with S1 S2. No murmurs, rubs, or gallops appreciated. Abdomen: Soft, non-tender, non-distended with normoactive bowel sounds. No hepatomegaly. No rebound/guarding. No obvious abdominal masses. Msk:  Strength and tone appear normal for age. Extremities: No clubbing or cyanosis. No edema.  Distal pedal pulses are 2+ and equal bilaterally. Neuro: Somnolent. No facial asymmetry. No focal deficit. Moves all extremities spontaneously. Resting tremor.  Psych:  Somnolent.    Assessment and Plan:   1. Afib with RVR: -Of unknown chronicity, possibly chronic according to family -There is a 12-lead from 12/21/2011 with the patient in Afib, a day later he is in NSR. The next 12-lead we have is this admission -He has been on warfarin for 2 years per the family -Aim for rate control at this time -Family will bring by prior cardiology office notes from [place] and translate (may need IT trainer) -Increase metoprolol to 37.5 mg bid -Add digoxin with loading dose of 0.5 mg today, followed by 0.125 mg daily thereafter and can be discharged on 0.125 mg daily -Continue warfarin -CHADS2VASc at least 7 (CHF, HTN, age x 2, stroke x 2, vascular disease)  2. Acute on chronic diastolic CHF: -Likely exacerbated by #1 -Give IV  Lasix today -May need PO Lasix at home either low-dose daily vs prn -Metoprolol as above  3. CAD s/p CABG s/p remote PCI: -Obtain records and translate  -No symptoms of angina -No plans for ischemic evaluation at this time -On warfarin in place of aspirin   4. Sepsis in the setting of PNA: -On azithromycin per IM -Likely playing a role in #1  5. Parkinson's disease: -Per IM  6. History of LE DVT: -Timing of this is uncertain per family -Coumadin as above   Signed, Eula Listen, PA-C Pager: [phone] 07/02/2015, 9:38 AM

## 2015-07-02 NOTE — Progress Notes (Signed)
Patient ID: Adam Roberson, male   DOB: Feb 05, 1929, 82 y.o.   MRN: [id number] [facility] [place] [facility]   PATIENT NAME: Adam Roberson    MR#:  [id number]  DATE OF BIRTH:  Feb 05, 1929  SUBJECTIVE:   Daughter at bedside.  Continues to have some cough, weakness. Afib with tachycardia today while working with physical therapy  REVIEW OF SYSTEMS:   Review of Systems  Constitutional: Positive for fever and malaise/fatigue. Negative for chills and weight loss.  HENT: Negative for ear discharge, ear pain and nosebleeds.   Eyes: Negative for blurred vision, pain and discharge.  Respiratory: Positive for cough and shortness of breath. Negative for sputum production, wheezing and stridor.   Cardiovascular: Negative for chest pain, palpitations, orthopnea and PND.  Gastrointestinal: Negative for nausea, vomiting, abdominal pain and diarrhea.  Genitourinary: Negative for urgency and frequency.  Musculoskeletal: Positive for falls. Negative for back pain and joint pain.  Neurological: Positive for tremors and weakness. Negative for sensory change, speech change and focal weakness.  Psychiatric/Behavioral: Negative for depression and hallucinations. The patient is not nervous/anxious.   All other systems reviewed and are negative.  DRUG ALLERGIES:  No Known Allergies  VITALS:  Blood pressure 133/70, pulse 147, temperature 97.5 F (36.4 C), temperature source Oral, resp. rate 16, height 5' 2.99" (1.6 m), weight 70 kg (154 lb 5.2 oz), SpO2 95 %.  PHYSICAL EXAMINATION:   Physical Exam  GENERAL:  82 y.o.-year-old patient lying in the bed with no acute distress. Appears ill EYES: Pupils equal, round, reactive to light and accommodation. No scleral icterus. Extraocular muscles intact.  HEENT: Head atraumatic, normocephalic. Oropharynx and nasopharynx clear.  NECK:  Supple, no jugular venous distention. No thyroid enlargement, no tenderness. Mild  tachycardia LUNGS: Normal breath sounds bilaterally, no wheezing,bibasilar rales, rhonchi. No use of accessory muscles of respiration.  CARDIOVASCULAR: S1, S2 . Irregular and tachycardic ABDOMEN: Soft, nontender, nondistended. Bowel sounds present. No organomegaly or mass.  EXTREMITIES: No cyanosis, clubbing or edema b/l.    NEUROLOGIC: Cranial nerves II through XII are intact. No focal Motor or sensory deficits b/l.  Resting tremor PSYCHIATRIC:  patient is alert and awake SKIN: No obvious rash, lesion, or ulcer.   LABORATORY PANEL:  CBC  Recent Labs Lab 06/29/15 0603  WBC 5.9  HGB 13.4  HCT 39.2*  PLT 115*    Chemistries   Recent Labs Lab 06/28/15 2008 06/29/15 0603  NA 131* 132*  K 3.8 3.7  CL 100* 104  CO2 25 22  GLUCOSE 118* 106*  BUN 16 12  CREATININE 1.09 0.85  CALCIUM 8.6* 7.3*  AST 28  --   ALT 25  --   ALKPHOS 74  --   BILITOT 1.2  --    Cardiac Enzymes  Recent Labs Lab 06/28/15 2008  TROPONINI <0.03   RADIOLOGY:  Dg Chest 2 View  07/02/2015  CLINICAL DATA:  Pulmonary infiltrates. EXAM: CHEST  2 VIEW COMPARISON:  06/30/2015 FINDINGS: Postop median sternotomy.  Mild cardiac enlargement. Progression of bilateral airspace disease which may represent interstitial edema. Small bilateral pleural effusions. Mild right lower lobe atelectasis. IMPRESSION: Progression of diffuse bilateral airspace disease. Findings are suggestive of congestive heart failure with progressive edema. Underlying pneumonia not excluded. Electronically Signed   By: Marlan Palauharles  Clark M.D.   On: 07/02/2015 08:30   ASSESSMENT AND PLAN:  Adam Roberson is a 82 y.o. male with a known history of CAD s/p CABG, Parkinson disease, HTN, Hyperlipidemia, h/o  DVT left leg on coumadin presets to the hospital for extreme weakness and fever.  Being admitted for sepsis secondary to pneumonia  # atrial fibrillation with rapid ventricular rate Given 1 dose of IV digoxin by cardiology. On metoprolol  twice a day. Monitor on telemetry.  # acute on chronic diastolic CHF Start IV Lasix.  # Sepsis with bilateral pneumonia and Acute hypoxic resp failure - Blood cx negative -Continue azithromycin D/C Zosyn. -Appreciate pulmonary input-pt has a very remote h/o smoking. -Repeat chest showed bilateral bronchopneumonia and CHF  # Parkinson disease- resting tremors present, shuffling gait  # CAD s/p CABG- stable, cont cardiac meds- asa, metoprolol, losartan Check ECHO - EF 60%  # H/o left LLE clot- cont coumadin, pharmacy to adjust dose  # Hyponatremia- hypovolemic- IV fluids and monitor  # DVT Prophylaxis- already on coumadin  D/w daughter Case discussed with Care Management/Social Worker. Management plans discussed with the patient, family and they are in agreement.  CODE STATUS: full  TOTAL TIME TAKING CARE OF THIS PATIENT:35 minutes.   POSSIBLE D/C IN2-3 DAYS, DEPENDING ON CLINICAL CONDITION.  Note: This dictation was prepared with Dragon dictation along with smaller phrase technology. Any transcriptional errors that result from this process are unintentional.  Milagros Loll R M.D on 07/02/2015 at 12:03 PM  Between 7am to 6pm - Pager - [phone]  After 6pm go to [email or web address] - password [email or web address] [facility]  [place] [facility]  Office  [phone]  CC: Primary care physician; [facility] C

## 2015-07-03 DIAGNOSIS — I4891 Unspecified atrial fibrillation: Secondary | ICD-10-CM

## 2015-07-03 LAB — BASIC METABOLIC PANEL
Anion gap: 8 (ref 5–15)
BUN: 21 mg/dL — ABNORMAL HIGH (ref 6–20)
CHLORIDE: 103 mmol/L (ref 101–111)
CO2: 30 mmol/L (ref 22–32)
Calcium: 8.9 mg/dL (ref 8.9–10.3)
Creatinine, Ser: 1.1 mg/dL (ref 0.61–1.24)
GFR calc non Af Amer: 59 mL/min — ABNORMAL LOW (ref >60)
Glucose, Bld: 110 mg/dL — ABNORMAL HIGH (ref 65–99)
POTASSIUM: 5 mmol/L (ref 3.5–5.1)
SODIUM: 141 mmol/L (ref 135–145)

## 2015-07-03 LAB — CBC
HEMATOCRIT: 44.1 % (ref 40.0–52.0)
HEMOGLOBIN: 15.1 g/dL (ref 13.0–18.0)
MCH: 31.4 pg (ref 26.0–34.0)
MCHC: 34.3 g/dL (ref 32.0–36.0)
MCV: 91.6 fL (ref 80.0–100.0)
Platelets: 186 10*3/uL (ref 150–440)
RBC: 4.82 MIL/uL (ref 4.40–5.90)
RDW: 13.6 % (ref 11.5–14.5)
WBC: 7.6 10*3/uL (ref 3.8–10.6)

## 2015-07-03 LAB — CULTURE, BLOOD (ROUTINE X 2)
Culture: NO GROWTH
Culture: NO GROWTH

## 2015-07-03 LAB — PROTIME-INR
INR: 2.84
PROTHROMBIN TIME: 29.4 s — AB (ref 11.4–15.0)

## 2015-07-03 MED ORDER — WARFARIN SODIUM 2.5 MG PO TABS
2.5000 mg | ORAL_TABLET | Freq: Once | ORAL | Status: AC
  Administered 2015-07-03: 2.5 mg via ORAL
  Filled 2015-07-03: qty 1

## 2015-07-03 MED ORDER — DIGOXIN 125 MCG PO TABS: 0.1250 mg | ORAL_TABLET | Freq: Every day | ORAL | Status: DC

## 2015-07-03 MED ORDER — METOPROLOL TARTRATE 50 MG PO TABS
50.0000 mg | ORAL_TABLET | Freq: Two times a day (BID) | ORAL | Status: DC
  Administered 2015-07-03 – 2015-07-05 (×5): 50 mg via ORAL
  Filled 2015-07-03 (×5): qty 1

## 2015-07-03 MED ORDER — DIGOXIN 250 MCG PO TABS
0.5000 mg | ORAL_TABLET | Freq: Every day | ORAL | Status: AC
  Administered 2015-07-03: 09:00:00 0.5 mg via ORAL
  Filled 2015-07-03: qty 2

## 2015-07-03 MED ORDER — DILTIAZEM HCL 25 MG/5ML IV SOLN
10.0000 mg | INTRAVENOUS | Status: AC
  Administered 2015-07-03: 10 mg via INTRAVENOUS
  Filled 2015-07-03: qty 5

## 2015-07-03 NOTE — Progress Notes (Signed)
Patient: Adam Roberson / Admit Date: 06/28/2015 / Date of Encounter: 07/03/2015, 8:14 AM   Subjective: Feeling better. Less SOB. Does not feel palpitations. Not on telemetry for review of heart rate. Last set of vitals showed HR of 136 bpm. Lopressor was increased from 37.5 mg bid to 50 mg bid this morning. Currently, no digoxin is ordered for this morning. Medical interpreter used over the phone for visit.   Review of Systems: Review of Systems  Unable to perform ROS: language     Objective: Telemetry: not on telemetry Physical Exam: Blood pressure 118/65, pulse 136, temperature 98.6 F (37 C), temperature source Oral, resp. rate 16, height 5' 2.99" (1.6 m), weight 154 lb 5.2 oz (70 kg), SpO2 95 %. Body mass index is 27.34 kg/(m^2). General: Frail appearing, in no acute distress. Head: Normocephalic, atraumatic, sclera non-icteric, no xanthomas, nares are without discharge. Neck: Negative for carotid bruits. JVP not elevated. Lungs: Clear bilaterally to auscultation without wheezes, rales, or rhonchi. Breathing is unlabored. Heart: Irregularly-irregular, S1 S2 without murmurs, rubs, or gallops.  Abdomen: Soft, non-tender, non-distended with normoactive bowel sounds. No rebound/guarding. Extremities: No clubbing or cyanosis. No edema. Distal pedal pulses are 2+ and equal bilaterally. Neuro: Somnolent. Psych:  Somnolent.  No intake or output data in the 24 hours ending 07/03/15 0814  Inpatient Medications:  . atorvastatin  20 mg Oral Daily  . azithromycin  500 mg Oral Daily  . carbidopa-levodopa  1 tablet Oral TID  . docusate sodium  100 mg Oral BID  . furosemide  40 mg Intravenous Q12H  . metoprolol tartrate  50 mg Oral BID  . warfarin  2.5 mg Oral ONCE-1800  . Warfarin - Pharmacist Dosing Inpatient   Does not apply q1800   Infusions:    Labs:  Recent Labs  07/03/15 0628  NA 141  K 5.0  CL 103  CO2 30  GLUCOSE 110*  BUN 21*  CREATININE 1.10  CALCIUM 8.9    No results for input(s): AST, ALT, ALKPHOS, BILITOT, PROT, ALBUMIN in the last 72 hours.  Recent Labs  07/03/15 0628  WBC 7.6  HGB 15.1  HCT 44.1  MCV 91.6  PLT 186   No results for input(s): CKTOTAL, CKMB, TROPONINI in the last 72 hours. Invalid input(s): POCBNP No results for input(s): HGBA1C in the last 72 hours.   Weights: Filed Weights   06/28/15 1953  Weight: 154 lb 5.2 oz (70 kg)     Radiology/Studies:  Dg Chest 1 View  06/30/2015  CLINICAL DATA:  82 year old male with history of dyspnea. EXAM: CHEST 1 VIEW COMPARISON:  Chest x-ray 06/29/2015. FINDINGS: Status post median sternotomy for CABG. Lung volumes are low. Diffuse peribronchial cuffing. Diffuse interstitial prominence and patchy airspace disease throughout the lungs bilaterally, overall very similar to the prior examination. Small bilateral pleural effusions appear unchanged. Pulmonary vasculature does not appear engorged. Heart size is upper limits of normal. Upper mediastinal contours are distorted by patient's rotation to the right. Atherosclerosis in the thoracic aorta. IMPRESSION: 1. The appearance of the chest remains most concerning for multilobar bronchopneumonia. 2. Small bilateral pleural effusions are unchanged. 3. Atherosclerosis. Electronically Signed   By: Trudie Reed M.D.   On: 06/30/2015 08:36   Dg Chest 1 View  06/29/2015  CLINICAL DATA:  82 year old male with history of fever and weakness. EXAM: CHEST 1 VIEW COMPARISON:  Chest x-ray 06/28/2015. FINDINGS: Lung volumes are low. Diffuse interstitial prominence and patchy airspace opacities are noted throughout the  lungs bilaterally, very similar to the prior examination. Small bilateral pleural effusions. Pulmonary vasculature does not appear engorged. Heart size is borderline enlarged, likely accentuated by low lung volumes and patient's rotation to the right which also distorts upper mediastinal contours. Atherosclerosis in the thoracic aorta.  Status post median sternotomy. IMPRESSION: 1. Widespread interstitial and patchy airspace disease in the lungs bilaterally remains concerning for severe multilobar bronchopneumonia. 2. Atherosclerosis. Electronically Signed   By: Trudie Reedaniel  Entrikin M.D.   On: 06/29/2015 15:33   Dg Chest 2 View  07/02/2015  CLINICAL DATA:  Pulmonary infiltrates. EXAM: CHEST  2 VIEW COMPARISON:  06/30/2015 FINDINGS: Postop median sternotomy.  Mild cardiac enlargement. Progression of bilateral airspace disease which may represent interstitial edema. Small bilateral pleural effusions. Mild right lower lobe atelectasis. IMPRESSION: Progression of diffuse bilateral airspace disease. Findings are suggestive of congestive heart failure with progressive edema. Underlying pneumonia not excluded. Electronically Signed   By: Marlan Palauharles  Clark M.D.   On: 07/02/2015 08:30   Dg Chest 2 View  06/28/2015  CLINICAL DATA:  Fever and cough for 1 day. Fall, right-sided numbness. Initial encounter. EXAM: CHEST  2 VIEW COMPARISON:  None. FINDINGS: Trachea is midline. Heart size is accentuated by AP technique. There is diffuse interstitial prominence and indistinctness. No definite pleural fluid. Degenerative changes are seen in the spine. IMPRESSION: Diffuse pulmonary parenchymal interstitial prominence and indistinctness may be due to atypical/viral pneumonia or pulmonary edema. Electronically Signed   By: Leanna BattlesMelinda  Blietz M.D.   On: 06/28/2015 21:17   Ct Head Wo Contrast  06/28/2015  CLINICAL DATA:  Fall, weakness, right-sided numbness, bilateral lower extremity weakness, initial encounter. Parkinson's disease. On Coumadin. EXAM: CT HEAD WITHOUT CONTRAST TECHNIQUE: Contiguous axial images were obtained from the base of the skull through the vertex without intravenous contrast. COMPARISON:  None. FINDINGS: Likely remote lacunar infarcts in the basal ganglia. Otherwise, no evidence of an acute infarct, acute hemorrhage, mass lesion, mass effect or  hydrocephalus. Ventricular dilatation is likely in proportion to the degree of advanced atrophy. No fracture. Small amount of fluid in the left mastoid air cells. IMPRESSION: 1. No acute intracranial abnormality. 2. Atrophy. 3. Likely remote bilateral basal ganglia lacunar infarcts. 4. Left mastoid effusion. Electronically Signed   By: Leanna BattlesMelinda  Blietz M.D.   On: 06/28/2015 21:09     Assessment and Plan   1. Afib with RVR: -Of unknown chronicity, possibly chronic according to family -There is a 12-lead from 12/21/2011 with the patient in Afib, a day later he is in NSR. The next 12-lead we have is this admission -He has been on warfarin for 2 years per the family -Aim for rate control at this time -Family will bring by prior cardiology office notes from [place] and translate (may need IT trainerofficial translator) -Placed on telemetry this morning given Afib with RVR -Heart rates in the 1-teens to 120s this morning -Metoprolol was increased from 37.5 mg bid to 50 mg bid, would be cautious with increasing further as he has been hypotensive with metoprolol -Give one-time dose of PO digoxin 0.5 mg, followed by 0.125 mg daily on 4/20 for rate control -Continue warfarin -CHADS2VASc at least 7 (CHF, HTN, age x 2, stroke x 2, vascular disease)  2. Acute on chronic diastolic CHF: -Improved -Likely exacerbated by #1 -Changed IV Lasix 40 mg bid to PO Lasix 40 mg bid -Will likely need lasix every other day at home -Metoprolol as above  3. CAD s/p CABG s/p remote PCI: -Obtain records and translate  -  No symptoms of angina -No plans for ischemic evaluation at this time -On warfarin in place of aspirin   4. Sepsis in the setting of PNA: -On azithromycin per IM -Likely playing a role in #1  5. Parkinson's disease: -Per IM  6. History of LE DVT: -Timing of this is uncertain per family -Coumadin as above   Signed, Eula Listen, PA-C Pager: [phone] 07/03/2015, 8:14 AM

## 2015-07-03 NOTE — Progress Notes (Signed)
Dr. Herbie BaltimoreHarding paged to make aware of a fib RVR HR- 130-150s/ orders given to push iv cardizem once/ will monitor closely.

## 2015-07-03 NOTE — Plan of Care (Signed)
Problem: SLP Dysphagia Goals Goal: Misc Dysphagia Goal Pt and family/caregivers would benefit from ST services for further education on PMV wear and precautions for care to help ensure safety w/ PMV use while admitted.

## 2015-07-03 NOTE — Progress Notes (Signed)
PT working with pt. HR elevated / PT stopped/ HR decreased some/ Dr. Elpidio AnisSudini made aware / no new orders / stated to monitor

## 2015-07-03 NOTE — Progress Notes (Signed)
Physical Therapy Treatment Patient Details Name: Adam Roberson MRN: [id number] DOB: 1928-07-06 Today's Date: 07/03/2015    History of Present Illness Adam Roberson is an 82yo Middle-[place]astern Male originally from [place] (Farsi only) who comes to [facility] on 4/14 p sustained weakness and fever at home. PMH: CAD, s/p CABG, PD (~10y), HLD, DVT. Pt has no permanent residence, but lives with various children throughout the year both in [place] and [place]. Pt currently lives with daughter and 3 grandchildren, c 24/7 assistance, and  performs mostly household distance ambulation s AD. Family reports no history of falls. At baseline pt requires modA for bathing/dressing, toilets independently, and requires  assistance for self-feeding due to Parkinsonian tremor. History is collected at bedside from [place], Daughter, and Son-in-law.      PT Comments    Pt is making limited progress towards goals and is limited by HR. Increased HR noted with all mobility, however decreases with resting. Used interpreter [phone] for all communication. Pt with increased rigidity this date and needed increased assist for mobility as he demonstrates post. Leaning. Pt soiled upon standing, assistance needed for brief change and linen removed. + 2 assist for hygiene. Good endurance with there-ex noted. Secondary to increased physical assist and medical needs, recommend SNF placement at this time.  Follow Up Recommendations  SNF     Equipment Recommendations       Recommendations for Other Services       Precautions / Restrictions Precautions Precautions: Fall Restrictions Weight Bearing Restrictions: No    Mobility  Bed Mobility Overal bed mobility: Needs Assistance;+2 for physical assistance Bed Mobility: Supine to Sit     Supine to sit: Mod assist;+2 for physical assistance     General bed mobility comments: assistance for bed mobility for trunk stability. Once seated at EOB, pt with heavy post leaning noted.  Mod assist to maintain seated balance  Transfers Overall transfer level: Needs assistance Equipment used: None Transfers: Sit to/from Stand Sit to Stand: Mod assist         General transfer comment: Transfer performed with +2 assist and rw. Pt with heavy posterior leaning, however able to hold with HHA while RN aide changed diaper. RW then given to pt with improvement in static standing balance decreasing assist to min assist + 2.  Ambulation/Gait Ambulation/Gait assistance: Min assist;+2 physical assistance Ambulation Distance (Feet): 15 Feet Assistive device: Rolling walker (2 wheeled) Gait Pattern/deviations: Step-to pattern     General Gait Details: Pt able to ambulate 15' with rw and min assist +2. Pt with extrememly small step length and demonstrates shuffling gait pattern. Pt fatigues with increased ambulation distance. HR increases to 136 post ambulation along with decreased O2 sats to 87% on room air. With seated rest break, HR decreased to 83bpm and O2 sats increased to 91%. Pt very fatigued with exertion   Stairs            Wheelchair Mobility    Modified Rankin (Stroke Patients Only)       Balance                                    Cognition Arousal/Alertness: Lethargic Behavior During Therapy: WFL for tasks assessed/performed Overall Cognitive Status: Within Functional Limits for tasks assessed                      Exercises Other Exercises Other Exercises: Pt performed supine  ther-ex on B LE including SAQ, hip abd/add, SLR, and heel slides. All ther-ex performed x 12 reps with min assist for completion.     General Comments        Pertinent Vitals/Pain Pain Assessment: No/denies pain    Home Living                      Prior Function            PT Goals (current goals can now be found in the care plan section) Acute Rehab PT Goals Patient Stated Goal: to return home PT Goal Formulation: Patient unable to  participate in goal setting Time For Goal Achievement: 07/15/15 Potential to Achieve Goals: Good Progress towards PT goals: Progressing toward goals    Frequency  Min 2X/week    PT Plan Discharge plan needs to be updated    Co-evaluation             End of Session   Activity Tolerance: Patient limited by fatigue Patient left: in chair;with chair alarm set;with call bell/phone within reach;with family/visitor present     Time: 1040-1105 PT Time Calculation (min) (ACUTE ONLY): 25 min  Charges:  $Gait Training: 8-22 mins $Therapeutic Exercise: 8-22 mins                    G Codes:      Devynn Scheff 07/03/2015, 11:49 AM Elizabeth PalauStephanie Davine Sweney, PT, DPT [phone]

## 2015-07-03 NOTE — Progress Notes (Signed)
ANTICOAGULATION CONSULT NOTE - Follow up Consult  Pharmacy Consult for warfarin Indication:  History of DVT  No Known Allergies  Patient Measurements: Height: 5' 2.99" (160 cm) Weight: 154 lb 5.2 oz (70 kg) IBW/kg (Calculated) : 56.88  Vital Signs: Temp: 98.6 F (37 C) (04/19 0447) Temp Source: Oral (04/19 0447) BP: 118/65 mmHg (04/19 0447) Pulse Rate: 136 (04/19 0447)  Labs:  Recent Labs  07/01/15 0508 07/02/15 0508 07/03/15 0628  HGB  --   --  15.1  HCT  --   --  44.1  PLT  --   --  186  LABPROT 33.5* 37.0* 29.4*  INR 3.38 3.86 2.84  CREATININE  --   --  1.10    Estimated Creatinine Clearance: 43.1 mL/min (by C-G formula based on Cr of 1.1).   Medical History: Past Medical History  Diagnosis Date  . Myocardial infarction [facility](HCC)     s/p CABG  . Parkinson disease (HCC)   . Hypertension   . Hyperlipidemia   . DVT of leg (deep venous thrombosis) (HCC)   . H/O: CVA (cerebrovascular accident)     Assessment: Pharmacy consulted to dose and monitor warfarin in this 82 year old male who was taking warfarin prior to admission for a history of DVT. His home regimen is warfarin 5 mg PO daily except 2.5 mg on M&F.   4/14 INR 2.96, Warfarin 2.5mg  4/15 INR 2.86, Warfarin 5 mg 4/16 INR 2.86; 5 mg  4/17: INR: 3.38; warfarin held 4/18 INR: 3.86, held 4/19: INR: 2.84  Goal of Therapy:  INR 2-3 Monitor platelets by anticoagulation protocol: Yes   Plan:   INR now within goal range. Will give warfarin 2.5 mg PO x 1 today and will reassess PT/INR with am labs. Pharmacy to follow per consult.   Demetrius Charityeldrin D. Jeanetta Alonzo, PharmD  Clinical Pharmacist   07/03/2015,8:12 AM

## 2015-07-04 LAB — PROTIME-INR
INR: 2.64
Prothrombin Time: 27.8 seconds — ABNORMAL HIGH (ref 11.4–15.0)

## 2015-07-04 LAB — DIGOXIN LEVEL: Digoxin Level: 1.1 ng/mL (ref 0.8–2.0)

## 2015-07-04 MED ORDER — WARFARIN SODIUM 4 MG PO TABS
3.5000 mg | ORAL_TABLET | Freq: Once | ORAL | Status: AC
  Administered 2015-07-04: 3.5 mg via ORAL
  Filled 2015-07-04: qty 1

## 2015-07-04 MED ORDER — DILTIAZEM HCL 30 MG PO TABS
30.0000 mg | ORAL_TABLET | Freq: Four times a day (QID) | ORAL | Status: DC
  Administered 2015-07-04 – 2015-07-05 (×4): 30 mg via ORAL
  Filled 2015-07-04 (×4): qty 1

## 2015-07-04 NOTE — Progress Notes (Signed)
Patient ID: Adam Roberson, male   DOB: Nov 22, 1928, 82 y.o.   MRN: [id number] [facility] [place] [facility]   PATIENT NAME: Adam Roberson    MR#:  [id number]  DATE OF BIRTH:  1928-03-24  SUBJECTIVE:   Daughter at bedside. No concerns today. Good appetite. Heart rate continues to be elevated at more than 120.  REVIEW OF SYSTEMS:   Review of Systems  Constitutional: Positive for fever and malaise/fatigue. Negative for chills and weight loss.  HENT: Negative for ear discharge, ear pain and nosebleeds.   Eyes: Negative for blurred vision, pain and discharge.  Respiratory: Positive for cough and shortness of breath. Negative for sputum production, wheezing and stridor.   Cardiovascular: Negative for chest pain, palpitations, orthopnea and PND.  Gastrointestinal: Negative for nausea, vomiting, abdominal pain and diarrhea.  Genitourinary: Negative for urgency and frequency.  Musculoskeletal: Positive for falls. Negative for back pain and joint pain.  Neurological: Positive for tremors and weakness. Negative for sensory change, speech change and focal weakness.  Psychiatric/Behavioral: Negative for depression and hallucinations. The patient is not nervous/anxious.   All other systems reviewed and are negative.  DRUG ALLERGIES:  No Known Allergies  VITALS:  Blood pressure 120/77, pulse 63, temperature 98.4 F (36.9 C), temperature source Oral, resp. rate 15, height 5' 2.99" (1.6 m), weight 70 kg (154 lb 5.2 oz), SpO2 94 %.  PHYSICAL EXAMINATION:   Physical Exam  GENERAL:  82 y.o.-year-old patient lying in the bed with no acute distress.  EYES: Pupils equal, round, reactive to light and accommodation. No scleral icterus. Extraocular muscles intact.  HEENT: Head atraumatic, normocephalic. Oropharynx and nasopharynx clear.  NECK:  Supple, no jugular venous distention. No thyroid enlargement, no tenderness. Mild tachycardia LUNGS: Normal breath  sounds bilaterally, no wheezing,bibasilar rales, rhonchi. No use of accessory muscles of respiration.  CARDIOVASCULAR: S1, S2 . Irregular and tachycardic ABDOMEN: Soft, nontender, nondistended. Bowel sounds present. No organomegaly or mass.  EXTREMITIES: No cyanosis, clubbing or edema b/l.    NEUROLOGIC: Cranial nerves II through XII are intact. No focal Motor or sensory deficits b/l.  Resting tremor PSYCHIATRIC:  patient is alert and awake SKIN: No obvious rash, lesion, or ulcer.   LABORATORY PANEL:  CBC  Recent Labs Lab 07/03/15 0628  WBC 7.6  HGB 15.1  HCT 44.1  PLT 186    Chemistries   Recent Labs Lab 06/28/15 2008  07/03/15 0628  NA 131*  < > 141  K 3.8  < > 5.0  CL 100*  < > 103  CO2 25  < > 30  GLUCOSE 118*  < > 110*  BUN 16  < > 21*  CREATININE 1.09  < > 1.10  CALCIUM 8.6*  < > 8.9  AST 28  --   --   ALT 25  --   --   ALKPHOS 74  --   --   BILITOT 1.2  --   --   < > = values in this interval not displayed. Cardiac Enzymes  Recent Labs Lab 06/28/15 2008  TROPONINI <0.03   RADIOLOGY:  No results found. ASSESSMENT AND PLAN:  Adam Roberson is a 82 y.o. male with a known history of CAD s/p CABG, Parkinson disease, HTN, Hyperlipidemia, h/o DVT left leg on coumadin presets to the hospital for extreme weakness and fever.  Being admitted for sepsis secondary to pneumonia  # Atrial fibrillation with rapid ventricular rate On metoprolol twice a day. Cardiology started Cardizem.  Patient is DIGOXIN On Coumadin  # Acute on chronic diastolic CHF Change to PO lasix  # Sepsis with bilateral pneumonia and Acute hypoxic resp failure - Blood cx negative - Continue azithromycin - Appreciate pulmonary input-pt has a very remote h/o smoking. - Repeat chest showed bilateral bronchopneumonia and CHF  # Parkinson disease- resting tremors present, shuffling gait  # CAD s/p CABG- stable, cont cardiac meds- asa, metoprolol, losartan Check ECHO - EF 60%  # H/o  left LLE clot- cont coumadin, pharmacy to adjust dose  # Hyponatremia- Improved  # DVT Prophylaxis- already on coumadin  D/w daughter Case discussed with Care Management/Social Worker. Management plans discussed with the patient, family and they are in agreement.  CODE STATUS: full  TOTAL TIME TAKING CARE OF THIS PATIENT: 30 minutes.   Possible discharge tomorrow if heart rate is improved. Skilled nursing facility was recommended but patient has no insurance. Family wants to take patient home  Note: This dictation was prepared with Dragon dictation along with smaller phrase technology. Any transcriptional errors that result from this process are unintentional.  Milagros LollSudini, Aundra Pung R M.D on 07/04/2015 at 12:04 PM  Between 7am to 6pm - Pager - [phone]  After 6pm go to [email or web address] - password [email or web address] [facility]  [facility]  Office  [phone]  CC: Primary care physician; [facility]

## 2015-07-04 NOTE — Progress Notes (Signed)
Patient: Adam Roberson / Admit Date: 06/28/2015 / Date of Encounter: 07/04/2015, 9:24 AM   Subjective: No acute overnight events. Digoxin level 1.1 this morning. I held digoxin dose. Still positive nearly 4 L for the admission.   Review of Systems: Review of Systems  Unable to perform ROS: language     Objective: Telemetry: Afib, 1-teens currently, has been in the 120's to 140's bpm Physical Exam: Blood pressure 120/77, pulse 63, temperature 98.4 F (36.9 C), temperature source Oral, resp. rate 15, height 5' 2.99" (1.6 m), weight 154 lb 5.2 oz (70 kg), SpO2 94 %. Body mass index is 27.34 kg/(m^2). General:Frail appearing, in no acute distress. Head: Normocephalic, atraumatic, sclera non-icteric, no xanthomas, nares are without discharge. Neck: Negative for carotid bruits. JVP not elevated. Lungs: Clear bilaterally to auscultation without wheezes, rales, or rhonchi. Breathing is unlabored. Heart: Tachycardic, irregularly-irregular, S1 S2 without murmurs, rubs, or gallops.  Abdomen: Soft, non-tender, non-distended with normoactive bowel sounds. No rebound/guarding. Extremities: No clubbing or cyanosis. No edema. Distal pedal pulses are 2+ and equal bilaterally. Neuro:Somnolent. Psych:  Somnolent.   Intake/Output Summary (Last 24 hours) at 07/04/15 0924 Last data filed at 07/04/15 0526  Gross per 24 hour  Intake    220 ml  Output      3 ml  Net    217 ml    Inpatient Medications:  . atorvastatin  20 mg Oral Daily  . azithromycin  500 mg Oral Daily  . carbidopa-levodopa  1 tablet Oral TID  . diltiazem  30 mg Oral Q6H  . docusate sodium  100 mg Oral BID  . furosemide  40 mg Intravenous Q12H  . metoprolol tartrate  50 mg Oral BID  . warfarin  3.5 mg Oral ONCE-1800  . Warfarin - Pharmacist Dosing Inpatient   Does not apply q1800   Infusions:    Labs:  Recent Labs  07/03/15 0628  NA 141  K 5.0  CL 103  CO2 30  GLUCOSE 110*  BUN 21*  CREATININE 1.10  CALCIUM  8.9   No results for input(s): AST, ALT, ALKPHOS, BILITOT, PROT, ALBUMIN in the last 72 hours.  Recent Labs  07/03/15 0628  WBC 7.6  HGB 15.1  HCT 44.1  MCV 91.6  PLT 186   No results for input(s): CKTOTAL, CKMB, TROPONINI in the last 72 hours. Invalid input(s): POCBNP No results for input(s): HGBA1C in the last 72 hours.   Weights: Filed Weights   06/28/15 1953  Weight: 154 lb 5.2 oz (70 kg)     Radiology/Studies:  Dg Chest 1 View  06/30/2015  CLINICAL DATA:  82 year old male with history of dyspnea. EXAM: CHEST 1 VIEW COMPARISON:  Chest x-ray 06/29/2015. FINDINGS: Status post median sternotomy for CABG. Lung volumes are low. Diffuse peribronchial cuffing. Diffuse interstitial prominence and patchy airspace disease throughout the lungs bilaterally, overall very similar to the prior examination. Small bilateral pleural effusions appear unchanged. Pulmonary vasculature does not appear engorged. Heart size is upper limits of normal. Upper mediastinal contours are distorted by patient's rotation to the right. Atherosclerosis in the thoracic aorta. IMPRESSION: 1. The appearance of the chest remains most concerning for multilobar bronchopneumonia. 2. Small bilateral pleural effusions are unchanged. 3. Atherosclerosis. Electronically Signed   By: Trudie Reed M.D.   On: 06/30/2015 08:36   Dg Chest 1 View  06/29/2015  CLINICAL DATA:  82 year old male with history of fever and weakness. EXAM: CHEST 1 VIEW COMPARISON:  Chest x-ray 06/28/2015. FINDINGS:  Lung volumes are low. Diffuse interstitial prominence and patchy airspace opacities are noted throughout the lungs bilaterally, very similar to the prior examination. Small bilateral pleural effusions. Pulmonary vasculature does not appear engorged. Heart size is borderline enlarged, likely accentuated by low lung volumes and patient's rotation to the right which also distorts upper mediastinal contours. Atherosclerosis in the thoracic aorta.  Status post median sternotomy. IMPRESSION: 1. Widespread interstitial and patchy airspace disease in the lungs bilaterally remains concerning for severe multilobar bronchopneumonia. 2. Atherosclerosis. Electronically Signed   By: Trudie Reedaniel  Entrikin M.D.   On: 06/29/2015 15:33   Dg Chest 2 View  07/02/2015  CLINICAL DATA:  Pulmonary infiltrates. EXAM: CHEST  2 VIEW COMPARISON:  06/30/2015 FINDINGS: Postop median sternotomy.  Mild cardiac enlargement. Progression of bilateral airspace disease which may represent interstitial edema. Small bilateral pleural effusions. Mild right lower lobe atelectasis. IMPRESSION: Progression of diffuse bilateral airspace disease. Findings are suggestive of congestive heart failure with progressive edema. Underlying pneumonia not excluded. Electronically Signed   By: Marlan Palauharles  Clark M.D.   On: 07/02/2015 08:30   Dg Chest 2 View  06/28/2015  CLINICAL DATA:  Fever and cough for 1 day. Fall, right-sided numbness. Initial encounter. EXAM: CHEST  2 VIEW COMPARISON:  None. FINDINGS: Trachea is midline. Heart size is accentuated by AP technique. There is diffuse interstitial prominence and indistinctness. No definite pleural fluid. Degenerative changes are seen in the spine. IMPRESSION: Diffuse pulmonary parenchymal interstitial prominence and indistinctness may be due to atypical/viral pneumonia or pulmonary edema. Electronically Signed   By: Leanna BattlesMelinda  Blietz M.D.   On: 06/28/2015 21:17   Ct Head Wo Contrast  06/28/2015  CLINICAL DATA:  Fall, weakness, right-sided numbness, bilateral lower extremity weakness, initial encounter. Parkinson's disease. On Coumadin. EXAM: CT HEAD WITHOUT CONTRAST TECHNIQUE: Contiguous axial images were obtained from the base of the skull through the vertex without intravenous contrast. COMPARISON:  None. FINDINGS: Likely remote lacunar infarcts in the basal ganglia. Otherwise, no evidence of an acute infarct, acute hemorrhage, mass lesion, mass effect or  hydrocephalus. Ventricular dilatation is likely in proportion to the degree of advanced atrophy. No fracture. Small amount of fluid in the left mastoid air cells. IMPRESSION: 1. No acute intracranial abnormality. 2. Atrophy. 3. Likely remote bilateral basal ganglia lacunar infarcts. 4. Left mastoid effusion. Electronically Signed   By: Leanna BattlesMelinda  Blietz M.D.   On: 06/28/2015 21:09     Assessment and Plan   1. Afib with RVR: -Of unknown chronicity, possibly chronic according to family -There is a 12-lead from 12/21/2011 with the patient in Afib, a day later he is in NSR. The next 12-lead we have is this admission -He has been on warfarin for 2 years per the family -Aim for rate control at this time -Family will bring by prior cardiology office notes from [place] and translate (may need official translator) -Heart rates in the 1-teens to 120s this morning -Metoprolol 50 mg bid -Had good response with IV diltiazem on 4/19 with HR into the 60's bpm -Digoxin level is elevated at 1.1 this morning, I held digoxin dose -Start diltiazem 30 mg q 6 hours -Continue warfarin -CHADS2VASc at least 7 (CHF, HTN, age x 2, stroke x 2, vascular disease)  2. Acute on chronic diastolic CHF: -Improved -Likely exacerbated by #1 -PO Lasix 40 mg bid -Will likely need lasix every other day at home -Metoprolol as above  3. CAD s/p CABG s/p remote PCI: -Obtain records and translate  -No symptoms of angina -No  plans for ischemic evaluation at this time -On warfarin in place of aspirin   4. Sepsis in the setting of PNA: -On azithromycin per IM -Likely playing a role in #1  5. Parkinson's disease: -Per IM  6. History of LE DVT: -Timing of this is uncertain per family -Coumadin as above  Signed, Eula Listen, PA-C Pager: [phone] 07/04/2015, 9:24 AM

## 2015-07-04 NOTE — Progress Notes (Signed)
Patient ID: Adam Roberson, male   DOB: 03/07/1929, 82 y.o.   MRN: [id number] [facility] [place] at [facility]   PATIENT NAME: Adam Roberson    MR#:  [id number]  DATE OF BIRTH:  1928-08-08  SUBJECTIVE:   Daughter at bedside.  No concerns today.  HR still high.  REVIEW OF SYSTEMS:   Review of Systems  Constitutional: Positive for fever and malaise/fatigue. Negative for chills and weight loss.  HENT: Negative for ear discharge, ear pain and nosebleeds.   Eyes: Negative for blurred vision, pain and discharge.  Respiratory: Positive for cough and shortness of breath. Negative for sputum production, wheezing and stridor.   Cardiovascular: Negative for chest pain, palpitations, orthopnea and PND.  Gastrointestinal: Negative for nausea, vomiting, abdominal pain and diarrhea.  Genitourinary: Negative for urgency and frequency.  Musculoskeletal: Positive for falls. Negative for back pain and joint pain.  Neurological: Positive for tremors and weakness. Negative for sensory change, speech change and focal weakness.  Psychiatric/Behavioral: Negative for depression and hallucinations. The patient is not nervous/anxious.   All other systems reviewed and are negative.  DRUG ALLERGIES:  No Known Allergies  VITALS:  Blood pressure 120/77, pulse 63, temperature 98.4 F (36.9 C), temperature source Oral, resp. rate 15, height 5' 2.99" (1.6 m), weight 70 kg (154 lb 5.2 oz), SpO2 94 %.  PHYSICAL EXAMINATION:   Physical Exam  GENERAL:  82 y.o.-year-old patient lying in the bed with no acute distress.  EYES: Pupils equal, round, reactive to light and accommodation. No scleral icterus. Extraocular muscles intact.  HEENT: Head atraumatic, normocephalic. Oropharynx and nasopharynx clear.  NECK:  Supple, no jugular venous distention. No thyroid enlargement, no tenderness. Mild tachycardia LUNGS: Normal breath sounds bilaterally, no wheezing,bibasilar rales,  rhonchi. No use of accessory muscles of respiration.  CARDIOVASCULAR: S1, S2 . Irregular and tachycardic ABDOMEN: Soft, nontender, nondistended. Bowel sounds present. No organomegaly or mass.  EXTREMITIES: No cyanosis, clubbing or edema b/l.    NEUROLOGIC: Cranial nerves II through XII are intact. No focal Motor or sensory deficits b/l.  Resting tremor PSYCHIATRIC:  patient is alert and awake SKIN: No obvious rash, lesion, or ulcer.   LABORATORY PANEL:  CBC  Recent Labs Lab 07/03/15 0628  WBC 7.6  HGB 15.1  HCT 44.1  PLT 186    Chemistries   Recent Labs Lab 06/28/15 2008  07/03/15 0628  NA 131*  < > 141  K 3.8  < > 5.0  CL 100*  < > 103  CO2 25  < > 30  GLUCOSE 118*  < > 110*  BUN 16  < > 21*  CREATININE 1.09  < > 1.10  CALCIUM 8.6*  < > 8.9  AST 28  --   --   ALT 25  --   --   ALKPHOS 74  --   --   BILITOT 1.2  --   --   < > = values in this interval not displayed. Cardiac Enzymes  Recent Labs Lab 06/28/15 2008  TROPONINI <0.03   RADIOLOGY:  No results found. ASSESSMENT AND PLAN:  Adam Roberson is a 82 y.o. male with a known history of CAD s/p CABG, Parkinson disease, HTN, Hyperlipidemia, h/o DVT left leg on coumadin presets to the hospital for extreme weakness and fever.  Being admitted for sepsis secondary to pneumonia  # Atrial fibrillation with rapid ventricular rate On metoprolol twice a day. Monitor on telemetry. Digoxin  # Acute on chronic diastolic  CHF Change to PO lasix  # Sepsis with bilateral pneumonia and Acute hypoxic resp failure - Blood cx negative - Continue azithromycin D/C Zosyn. - Appreciate pulmonary input-pt has a very remote h/o smoking. - Repeat chest showed bilateral bronchopneumonia and CHF  # Parkinson disease- resting tremors present, shuffling gait  # CAD s/p CABG- stable, cont cardiac meds- asa, metoprolol, losartan Check ECHO - EF 60%  # H/o left LLE clot- cont coumadin, pharmacy to adjust dose  #  Hyponatremia- Improved  # DVT Prophylaxis- already on coumadin  D/w daughter Case discussed with Care Management/Social Worker. Management plans discussed with the patient, family and they are in agreement.  CODE STATUS: full  TOTAL TIME TAKING CARE OF THIS PATIENT: 30 minutes.   POSSIBLE D/C IN2-3 DAYS, DEPENDING ON CLINICAL CONDITION.  Note: This dictation was prepared with Dragon dictation along with smaller phrase technology. Any transcriptional errors that result from this process are unintentional.  Adam Roberson, Adam Roberson R M.D on 07/04/2015 at 11:56 AM  Between 7am to 6pm - Pager - [phone]  After 6pm go to [email or web address] - password [email or web address] [email or web address]  [facility] [place] [facility]  Office  [phone]  CC: Primary care physician; [facility]

## 2015-07-04 NOTE — Progress Notes (Addendum)
ANTICOAGULATION CONSULT NOTE - Follow up Consult  Pharmacy Consult for warfarin Indication:  History of DVT  No Known Allergies  Patient Measurements: Height: 5' 2.99" (160 cm) Weight: 154 lb 5.2 oz (70 kg) IBW/kg (Calculated) : 56.88  Vital Signs: Temp: 98.4 F (36.9 C) (04/20 0523) Temp Source: Oral (04/20 0523) BP: 120/77 mmHg (04/20 0523) Pulse Rate: 63 (04/20 0523)  Labs:  Recent Labs  07/02/15 0508 07/03/15 0628 07/04/15 0311  HGB  --  15.1  --   HCT  --  44.1  --   PLT  --  186  --   LABPROT 37.0* 29.4* 27.8*  INR 3.86 2.84 2.64  CREATININE  --  1.10  --     Estimated Creatinine Clearance: 43.1 mL/min (by C-G formula based on Cr of 1.1).   Medical History: Past Medical History  Diagnosis Date  . Myocardial infarction [facility](HCC)     s/p CABG  . Parkinson disease (HCC)   . Hypertension   . Hyperlipidemia   . DVT of leg (deep venous thrombosis) (HCC)   . H/O: CVA (cerebrovascular accident)     Assessment: Pharmacy consulted to dose and monitor warfarin in this 82 year old male who was taking warfarin prior to admission for a history of DVT. His home regimen is warfarin 5 mg PO daily except 2.5 mg on M&F.   4/14 INR 2.96, Warfarin 2.5mg  4/15 INR 2.86, Warfarin 5 mg 4/16 INR 2.86; 5 mg  4/17: INR: 3.38; warfarin held 4/18 INR: 3.86, held 4/19: INR: 2.84;  2.5 mg  4/20: INR: 2.64;  Goal of Therapy:  INR 2-3 Monitor platelets by anticoagulation protocol: Yes   Plan:   INR now within goal range; however patient's INR is dropping. Will give warfarin 3.5 mg PO x 1 today and will reassess PT/INR with am labs. Pharmacy to follow per consult.   Demetrius Charityeldrin D. Lawton Dollinger, PharmD  Clinical Pharmacist   07/04/2015,8:21 AM

## 2015-07-05 ENCOUNTER — Telehealth: Payer: Self-pay | Admitting: Cardiovascular Disease

## 2015-07-05 ENCOUNTER — Telehealth: Payer: Self-pay

## 2015-07-05 LAB — PROTIME-INR
INR: 3.01
PROTHROMBIN TIME: 30.7 s — AB (ref 11.4–15.0)

## 2015-07-05 MED ORDER — WARFARIN SODIUM 2.5 MG PO TABS: 2.5000 mg | ORAL_TABLET | Freq: Once | ORAL | Status: DC

## 2015-07-05 MED ORDER — CARBIDOPA-LEVODOPA 25-250 MG PO TABS: 1.0000 | ORAL_TABLET | Freq: Three times a day (TID) | ORAL | Status: DC

## 2015-07-05 MED ORDER — DILTIAZEM HCL ER COATED BEADS 120 MG PO CP24: 120.0000 mg | ORAL_CAPSULE | Freq: Every day | ORAL | Status: DC

## 2015-07-05 MED ORDER — FUROSEMIDE 20 MG PO TABS: 20.0000 mg | ORAL_TABLET | ORAL | Status: DC

## 2015-07-05 MED ORDER — FUROSEMIDE 20 MG PO TABS: 20.0000 mg | ORAL_TABLET | Freq: Every day | ORAL | Status: DC

## 2015-07-05 NOTE — Discharge Instructions (Signed)

## 2015-07-05 NOTE — Telephone Encounter (Signed)
TCM..the patient being discharged today from hospital Pt saw Dr Kirke CorinArida, is coming 07/18/15 to see Ward Givenshris Berge

## 2015-07-05 NOTE — Progress Notes (Signed)
ANTICOAGULATION CONSULT NOTE - Follow up Consult  Pharmacy Consult for warfarin Indication:  History of DVT  No Known Allergies  Patient Measurements: Height: 5' 2.99" (160 cm) Weight: 154 lb 5.2 oz (70 kg) IBW/kg (Calculated) : 56.88  Vital Signs: Temp: 98.1 F (36.7 C) (04/21 0440) Temp Source: Oral (04/21 0440) BP: 121/73 mmHg (04/21 0440) Pulse Rate: 85 (04/21 0540)  Labs:  Recent Labs  07/03/15 0628 07/04/15 0311 07/05/15 0530  HGB 15.1  --   --   HCT 44.1  --   --   PLT 186  --   --   LABPROT 29.4* 27.8* 30.7*  INR 2.84 2.64 3.01  CREATININE 1.10  --   --     Estimated Creatinine Clearance: 43.1 mL/min (by C-G formula based on Cr of 1.1).   Medical History: Past Medical History  Diagnosis Date  . Myocardial infarction [facility](HCC)     s/p CABG  . Parkinson disease (HCC)   . Hypertension   . Hyperlipidemia   . DVT of leg (deep venous thrombosis) (HCC)   . H/O: CVA (cerebrovascular accident)     Assessment: Pharmacy consulted to dose and monitor warfarin in this 82 year old male who was taking warfarin prior to admission for a history of DVT. His home regimen is warfarin 5 mg PO daily except 2.5 mg on M&F.   4/14 INR 2.96, Warfarin 2.5mg  4/15 INR 2.86, Warfarin 5 mg 4/16 INR 2.86; 5 mg  4/17: INR: 3.38; warfarin held 4/18 INR: 3.86, held 4/19: INR: 2.84;  2.5 mg  4/20: INR: 2.64;  3.5 mg 4/21: INR: 3.01  Goal of Therapy:  INR 2-3 Monitor platelets by anticoagulation protocol: Yes   Plan:   INR just slightly above goal range. Will give warfarin 2.5 mg PO x 1 today and will reassess PT/INR with am labs. Patient on Azithromycin Pharmacy to follow per consult.   Bari MantisKristin Shaymus Eveleth PharmD Clinical Pharmacist  07/05/2015,8:12 AM

## 2015-07-05 NOTE — Progress Notes (Signed)
Language line used to assess and inform patient of medications to be administered. Wife at bedside. Bo McclintockBrewer,Ryne Mctigue S, RN

## 2015-07-05 NOTE — Telephone Encounter (Signed)
Called pt home number. Male who answered phone states he is at his daughter's, [phone]. Called and s/w daughter.  [facility]  Patient understands to follow up with provider Ward Givenshris Ber[facility]. Patient understands discharge instructions? yes Patient understands medications and regiment? yes Patient understands to bring all medications to this visit? yes  Directions to our office provided to daughter who verbalized understanding

## 2015-07-05 NOTE — Clinical Social Work Note (Signed)
Clinical Social Work Assessment  Patient Details  Name: Adam Roberson MRN: [id number] Date of Birth: 1928-06-20  Date of referral:  07/05/15               Reason for consult:  Facility Placement                Permission sought to share information with:  Family Supports Permission granted to share information::  Yes, Verbal Permission Granted  Name::     Wife and daughter   Housing/Transportation Living arrangements for the past 2 months:  Single Family Home Source of Information:  Adult Children, Spouse Patient Interpreter Needed:  Other (Comment Required) Adam Roberson) Criminal Activity/Legal Involvement Pertinent to Current Situation/Hospitalization:  No - Comment as needed Significant Relationships:  Adult Children, Other Family Members, Spouse Lives with:  Adult Children, Spouse Do you feel safe going back to the place where you live?  Yes Need for family participation in patient care:  Yes (Comment)  Care giving concerns:  No care giving concerns identified.   Social Worker assessment / plan:   CSW met with pt, pt's wife, and grandson. CSW used an interpreter to communicate. CSW introduced herself and explained role of social work. CSW also explained process of discharging to SNF as recommended by PT. Pt would like to take him home as pt declines placement and the family would like him home. Pt lacks insurance and per financial counselor, pt only is eligible for Emergent Medicaid which would only cover the admission. Pt does not qualify for Medicare or Insurance throught the affordable care act. [name] is aware of disposition. Pt will follow up with [facility]. [name] will provide transportation home. CSW updated MD. CSW is signing off as no further needs identified.  Employment status:  Retired Forensic scientist:  Self Pay (Medicaid Pending) PT Recommendations:  [place] / Referral to community resources:  Other (Comment  Required)  Patient/Family's Response to care:  [name] was appreciative of CSW support.   Patient/Family's Understanding of and Emotional Response to Diagnosis, Current Treatment, and Prognosis:  Pt's family would like for pt to return hom.   Emotional Assessment Appearance:  Appears stated age Attitude/Demeanor/Rapport:   Unable to assess Affect (typically observed):   Unable to assess Orientation:  Oriented to Self Alcohol / Substance use:  Never Used Psych involvement (Current and /or in the community):  No (Comment)  Discharge Needs  Concerns to be addressed:  Basic Needs Readmission within the last 30 days:  No Current discharge risk:  Chronically ill Barriers to Discharge:  Inadequate or no insurance   Darden Dates, LCSW 07/05/2015, 4:23 PM

## 2015-07-05 NOTE — Progress Notes (Signed)
Discharge instructions given and went over with patient, wife and grandson at bedside. Prescriptions given. All questions answered. Patient discharged home with family via wheelchair by nursing staff. Bo McclintockBrewer,Valine Drozdowski S, RN

## 2015-07-05 NOTE — Telephone Encounter (Signed)
Patient was discharged today 4/21. We need to schedule him a follow up for his hospital release.

## 2015-07-05 NOTE — Care Management (Signed)
Discharge to home today per Dr. Elpidio AnisSudini. Update sent to Lelon MastLorrie Holt at the [facility]. Family will transport. Gwenette GreetBrenda S Octavie Westerhold RN MSN CCM Care Management [phone]

## 2015-07-05 NOTE — Progress Notes (Signed)
Patient Name: Kaleen OdeaMohammadali Myles Date of Encounter: 07/05/2015  Hospital Problem List     Principal Problem:   Bilateral pneumonia Active Problems:   Sepsis (HCC)   Dyspnea   Acute on chronic diastolic CHF (congestive heart failure) (HCC)   Persistent atrial fibrillation (HCC)   Atrial fibrillation with rapid ventricular response (HCC)    Subjective   Feels well.  Family at bedside and grandson translating.  No c/p or sob.  Mild headache.  Inpatient Medications    . atorvastatin  20 mg Oral Daily  . azithromycin  500 mg Oral Daily  . carbidopa-levodopa  1 tablet Oral TID  . diltiazem  30 mg Oral Q6H  . docusate sodium  100 mg Oral BID  . furosemide  40 mg Intravenous Q12H  . metoprolol tartrate  50 mg Oral BID  . warfarin  2.5 mg Oral ONCE-1800  . Warfarin - Pharmacist Dosing Inpatient   Does not apply q1800    Vital Signs    Filed Vitals:   07/04/15 2117 07/05/15 0108 07/05/15 0440 07/05/15 0540  BP:  116/68 121/73   Pulse: 80 73 60 85  Temp:   98.1 F (36.7 C)   TempSrc:   Oral   Resp:   16   Height:      Weight:      SpO2:   96%     Intake/Output Summary (Last 24 hours) at 07/05/15 1037 Last data filed at 07/05/15 0757  Gross per 24 hour  Intake    240 ml  Output    300 ml  Net    -60 ml   Filed Weights   06/28/15 1953  Weight: 154 lb 5.2 oz (70 kg)    Physical Exam    General: Pleasant, NAD. Neuro: Alert and oriented X 3. Moves all extremities spontaneously. Psych: Normal affect. HEENT:  Normal  Neck: Supple without bruits or JVD. Lungs:  Resp regular and unlabored, CTA. Heart: IR, IR, no s3, s4, or murmurs. Abdomen: Soft, non-tender, non-distended, BS + x 4.  Extremities: No clubbing, cyanosis or edema. DP/PT/Radials 2+ and equal bilaterally.  Labs    CBC  Recent Labs  07/03/15 0628  WBC 7.6  HGB 15.1  HCT 44.1  MCV 91.6  PLT 186   Basic Metabolic Panel  Recent Labs  07/03/15 0628  NA 141  K 5.0  CL 103  CO2 30    GLUCOSE 110*  BUN 21*  CREATININE 1.10  CALCIUM 8.9    Telemetry    Not on tele  Radiology    Dg Chest 1 View  06/30/2015  CLINICAL DATA:  82 year old male with history of dyspnea. EXAM: CHEST 1 VIEW COMPARISON:  Chest x-ray 06/29/2015. FINDINGS: Status post median sternotomy for CABG. Lung volumes are low. Diffuse peribronchial cuffing. Diffuse interstitial prominence and patchy airspace disease throughout the lungs bilaterally, overall very similar to the prior examination. Small bilateral pleural effusions appear unchanged. Pulmonary vasculature does not appear engorged. Heart size is upper limits of normal. Upper mediastinal contours are distorted by patient's rotation to the right. Atherosclerosis in the thoracic aorta. IMPRESSION: 1. The appearance of the chest remains most concerning for multilobar bronchopneumonia. 2. Small bilateral pleural effusions are unchanged. 3. Atherosclerosis. Electronically Signed   By: Trudie Reedaniel  Entrikin M.D.   On: 06/30/2015 08:36   Dg Chest 1 View  06/29/2015  CLINICAL DATA:  82 year old male with history of fever and weakness. EXAM: CHEST 1 VIEW COMPARISON:  Chest x-ray 06/28/2015.  FINDINGS: Lung volumes are low. Diffuse interstitial prominence and patchy airspace opacities are noted throughout the lungs bilaterally, very similar to the prior examination. Small bilateral pleural effusions. Pulmonary vasculature does not appear engorged. Heart size is borderline enlarged, likely accentuated by low lung volumes and patient's rotation to the right which also distorts upper mediastinal contours. Atherosclerosis in the thoracic aorta. Status post median sternotomy. IMPRESSION: 1. Widespread interstitial and patchy airspace disease in the lungs bilaterally remains concerning for severe multilobar bronchopneumonia. 2. Atherosclerosis. Electronically Signed   By: Trudie Reed M.D.   On: 06/29/2015 15:33   Dg Chest 2 View  07/02/2015  CLINICAL DATA:  Pulmonary  infiltrates. EXAM: CHEST  2 VIEW COMPARISON:  06/30/2015 FINDINGS: Postop median sternotomy.  Mild cardiac enlargement. Progression of bilateral airspace disease which may represent interstitial edema. Small bilateral pleural effusions. Mild right lower lobe atelectasis. IMPRESSION: Progression of diffuse bilateral airspace disease. Findings are suggestive of congestive heart failure with progressive edema. Underlying pneumonia not excluded. Electronically Signed   By: Marlan Palau M.D.   On: 07/02/2015 08:30   Dg Chest 2 View  06/28/2015  CLINICAL DATA:  Fever and cough for 1 day. Fall, right-sided numbness. Initial encounter. EXAM: CHEST  2 VIEW COMPARISON:  None. FINDINGS: Trachea is midline. Heart size is accentuated by AP technique. There is diffuse interstitial prominence and indistinctness. No definite pleural fluid. Degenerative changes are seen in the spine. IMPRESSION: Diffuse pulmonary parenchymal interstitial prominence and indistinctness may be due to atypical/viral pneumonia or pulmonary edema. Electronically Signed   By: Leanna Battles M.D.   On: 06/28/2015 21:17   Ct Head Wo Contrast  06/28/2015  CLINICAL DATA:  Fall, weakness, right-sided numbness, bilateral lower extremity weakness, initial encounter. Parkinson's disease. On Coumadin. EXAM: CT HEAD WITHOUT CONTRAST TECHNIQUE: Contiguous axial images were obtained from the base of the skull through the vertex without intravenous contrast. COMPARISON:  None. FINDINGS: Likely remote lacunar infarcts in the basal ganglia. Otherwise, no evidence of an acute infarct, acute hemorrhage, mass lesion, mass effect or hydrocephalus. Ventricular dilatation is likely in proportion to the degree of advanced atrophy. No fracture. Small amount of fluid in the left mastoid air cells. IMPRESSION: 1. No acute intracranial abnormality. 2. Atrophy. 3. Likely remote bilateral basal ganglia lacunar infarcts. 4. Left mastoid effusion. Electronically Signed   By:  Leanna Battles M.D.   On: 06/28/2015 21:09    Assessment & Plan    1. Afib with RVR: -Of unknown chronicity, possibly chronic according to family -There is a 12-lead from 12/21/2011 with the patient in Afib, a day later he is in NSR. The next 12-lead we have is this admission -He has been on warfarin for 2 years per the family -Rates improved since yesterday - not on tele but 60 to 80's per VS recordings. -Cont Metoprolol 50 mg bid and consolidate dilt to 120 mg daily. -CHADS2VASc at least 7 (CHF, HTN, age x 2, stroke x 2, vascular disease) -Continue warfarin (INR 3 this AM).  2. Acute on chronic diastolic CHF: -Stable. -Likely exacerbated by #1 -Still on IV lasix  change to PO today. Suspect he will only need 20 daily. -HR/BP stable on  blocker and dilt. -F/U Dr. Kirke Corin in 1-2 wks. -Ready for d/c today.  3. CAD s/p CABG s/p remote PCI: -Trop nl on 4/14.  -No symptoms of angina -No plans for ischemic evaluation at this time. -On warfarin in place of aspirin  -Family will bring records to office.  4. Sepsis/Bilateral PNA: -On azithromycin per IM  5. Parkinson's disease: -Per IM  6. History of LE DVT: -Timing of this is uncertain per family -Coumadin as above  Signed, Nicolasa Ducking NP

## 2015-07-05 NOTE — Discharge Summary (Signed)
[facility]  at [facility]   PATIENT NAME: Adam Roberson    MR#:  [id number]  DATE OF BIRTH:  [birth year]  DATE OF ADMISSION:  06/28/2015 ADMITTING PHYSICIAN: Adam Baasadhika Kalisetti, MD  DATE OF DISCHARGE: 07/05/2015  1:26 PM  PRIMARY CARE PHYSICIAN: [facility]   ADMISSION DIAGNOSIS:  Parkinson disease (HCC) [G20] Bilateral pneumonia [J18.9] Other specified fever [R50.9]  DISCHARGE DIAGNOSIS:  Principal Problem:   Bilateral pneumonia Active Problems:   Sepsis (HCC)   Dyspnea   Persistent atrial fibrillation (HCC)   Acute on chronic diastolic CHF (congestive heart failure) (HCC)   Atrial fibrillation with rapid ventricular response (HCC)   SECONDARY DIAGNOSIS:   Past Medical History  Diagnosis Date  . Myocardial infarction [facility](HCC)     s/p CABG  . Parkinson disease (HCC)   . Hypertension   . Hyperlipidemia   . DVT of leg (deep venous thrombosis) (HCC)   . H/O: CVA (cerebrovascular accident)      ADMITTING HISTORY  Adam Roberson is a 82 y.o. male with a known history of CAD s/p CABG, Parkinson disease, HTN, Hyperlipidemia, h/o DVT left leg on coumadin presets to the hospital for extreme weakness and fever.  See PCP last month for influenza like illness symptoms and flu test was negative and was treated with Z pack. Symptoms resolved at the time. But his symptoms started with fever and noted to be extremely weak. Couple of falls at home. Patient can only speak Farsi and his daughter can speak minimal english and was able to provide history. Grand-daughter can speak english better. No nausea, vomiting or loss of appetite.  CXR with atypical pneumonia. Family denies any aspiration Being admitted for sepsis. Very tachycardic and febrile on initial presentation. Improved HR with IV fluids.  HOSPITAL COURSE:   Adam Roberson is a 82 y.o. male with a known history of CAD s/p CABG, Parkinson disease, HTN, Hyperlipidemia,  h/o DVT left leg on coumadin presets to the hospital for extreme weakness and fever.  Being admitted for sepsis secondary to pneumonia  # Atrial fibrillation with rapid ventricular rate - rate controlled at this time On metoprolol twice a day. And daily Cardizem Received digoxin IV dose one time and later stopped. On Coumadin  # Acute on chronic diastolic CHF Treated with IV Lasix. Patient improved well with diuresis. Change to PO lasix.  # Sepsis with bilateral pneumonia and Acute hypoxic resp failure - Blood cx negative - Finished azithromycin - Appreciate pulmonary input-pt has a very remote h/o smoking. - Patient has been on room air for the last 3 days in the hospital.  # Parkinson disease- resting tremors present, shuffling gait. On Sinemet  # CAD s/p CABG- stable, cont cardiac meds- asa, metoprolol Check ECHO - EF 60%  # H/o left LLE clot- cont coumadin, pharmacy to adjust dose  # Hyponatremia- Improved  # DVT Prophylaxis- already on coumadin  Stable for discharge. Physical therapy has seen the patient and recommended skilled nursing facility. The social worker talked to the family regarding facility transfer but they wanted to take the patient home due to good family support. Patient is being discharged home.  CONSULTS OBTAINED:  Treatment Team:  Alwyn Peawayne D Callwood, MD Antonieta Ibaimothy J Gollan, MD  DRUG ALLERGIES:  No Known Allergies  DISCHARGE MEDICATIONS:   Discharge Medication List as of 07/05/2015 11:43 AM    START taking these medications   Details  diltiazem (CARDIZEM CD) 120 MG 24 hr capsule Take  1 capsule (120 mg total) by mouth daily., Starting 07/05/2015, Until Discontinued, Print    furosemide (LASIX) 20 MG tablet Take 1 tablet (20 mg total) by mouth every other day., Starting 07/05/2015, Until Discontinued, Print      CONTINUE these medications which have CHANGED   Details  carbidopa-levodopa (SINEMET IR) 25-250 MG tablet Take 1 tablet by mouth 3 (three)  times daily., Starting 07/05/2015, Until Discontinued, Print      CONTINUE these medications which have NOT CHANGED   Details  aspirin 81 MG tablet Take 81 mg by mouth daily., Until Discontinued, Historical Med    atorvastatin (LIPITOR) 20 MG tablet Take 20 mg by mouth daily., Until Discontinued, Historical Med    metoprolol (LOPRESSOR) 50 MG tablet Take 25 mg by mouth 2 (two) times daily. Takes 1/4 in the am and 1/4 in the pm, Until Discontinued, Historical Med    warfarin (COUMADIN) 5 MG tablet Take 5 mg by mouth daily. Takes 1/2 tab Monday and Friday Takes 1 tab T, W, Th, Sat, Sun, Until Discontinued, Historical Med      STOP taking these medications     losartan (COZAAR) 25 MG tablet      nitroGLYCERIN 2.5 MG CR capsule         Today   VITAL SIGNS:  Blood pressure 97/60, pulse 65, temperature 98.3 F (36.8 C), temperature source Oral, resp. rate 18, height 5' 2.99" (1.6 m), weight 70 kg (154 lb 5.2 oz), SpO2 97 %.  I/O:   Intake/Output Summary (Last 24 hours) at 07/05/15 1401 Last data filed at 07/05/15 0757  Gross per 24 hour  Intake    240 ml  Output      0 ml  Net    240 ml    PHYSICAL EXAMINATION:  Physical Exam  GENERAL:  82 y.o.-year-old patient lying in the bed  LUNGS: Normal breath sounds bilaterally, no wheezing, rales,rhonchi or crepitation. No use of accessory muscles of respiration.  CARDIOVASCULAR: S1, S2 normal. No murmurs, rubs, or gallops. Irregular  ABDOMEN: Soft, non-tender, non-distended. Bowel sounds present. No organomegaly or mass.  NEUROLOGIC: Moves all 4 extremities. Resting tremor PSYCHIATRIC: The patient is alert and awake  DATA REVIEW:   CBC  Recent Labs Lab 07/03/15 0628  WBC 7.6  HGB 15.1  HCT 44.1  PLT 186    Chemistries   Recent Labs Lab 06/28/15 2008  07/03/15 0628  NA 131*  < > 141  K 3.8  < > 5.0  CL 100*  < > 103  CO2 25  < > 30  GLUCOSE 118*  < > 110*  BUN 16  < > 21*  CREATININE 1.09  < > 1.10  CALCIUM  8.6*  < > 8.9  AST 28  --   --   ALT 25  --   --   ALKPHOS 74  --   --   BILITOT 1.2  --   --   < > = values in this interval not displayed.  Cardiac Enzymes  Recent Labs Lab 06/28/15 2008  TROPONINI <0.03    Microbiology Results  Results for orders placed or performed during the hospital encounter of 06/28/15  Blood culture (routine x 2)     Status: None   Collection Time: 06/28/15  8:09 PM  Result Value Ref Range Status   Specimen Description BLOOD RIGHT ASSIST CONTROL  Final   Special Requests   Final    BOTTLES DRAWN AEROBIC AND ANAEROBIC 11CCAERO,9CCANA  Culture NO GROWTH 5 DAYS  Final   Report Status 07/03/2015 FINAL  Final  Blood culture (routine x 2)     Status: None   Collection Time: 06/28/15  8:21 PM  Result Value Ref Range Status   Specimen Description BLOOD RIGHT HAND  Final   Special Requests   Final    BOTTLES DRAWN AEROBIC AND ANAEROBIC 10CCAERO,1CCANA   Culture NO GROWTH 5 DAYS  Final   Report Status 07/03/2015 FINAL  Final  Rapid Influenza A&B Antigens ([facility] only)     Status: None   Collection Time: 06/28/15  9:47 PM  Result Value Ref Range Status   Influenza A ([facility]) NEGATIVE NEGATIVE Final   Influenza B ([facility]) NEGATIVE NEGATIVE Final  MRSA PCR Screening     Status: None   Collection Time: 06/29/15  2:46 PM  Result Value Ref Range Status   MRSA by PCR NEGATIVE NEGATIVE Final    Comment:        The GeneXpert MRSA Assay (FDA approved for NASAL specimens only), is one component of a comprehensive MRSA colonization surveillance program. It is not intended to diagnose MRSA infection nor to guide or monitor treatment for MRSA infections.     RADIOLOGY:  No results found.  Follow up with PCP in 1 week.  Management plans discussed with the patient, family and they are in agreement.  CODE STATUS:     Code Status Orders        Start     Ordered   06/28/15 2332  Full code   Continuous     06/28/15 2331    Code Status History    Date  Active Date Inactive Code Status Order ID Comments User Context   This patient has a current code status but no historical code status.      TOTAL TIME TAKING CARE OF THIS PATIENT ON DAY OF DISCHARGE: more than 30 minutes.   Milagros Loll R M.D on 07/05/2015 at 2:01 PM  Between 7am to 6pm - Pager - [phone]  After 6pm go to [email or web address] - password [email or web address] [facility]  [place] [facility]  Office  [phone]  CC: Primary care physician; [facility]  Note: This dictation was prepared with Dragon dictation along with smaller phrase technology. Any transcriptional errors that result from this process are unintentional.

## 2015-07-18 ENCOUNTER — Ambulatory Visit (INDEPENDENT_AMBULATORY_CARE_PROVIDER_SITE_OTHER): Payer: Self-pay | Admitting: Nurse Practitioner

## 2015-07-18 ENCOUNTER — Encounter: Payer: Self-pay | Admitting: Nurse Practitioner

## 2015-07-18 VITALS — BP 122/70 | HR 63 | Temp 98.5°F | Ht 62.0 in

## 2015-07-18 DIAGNOSIS — Z01810 Encounter for preprocedural cardiovascular examination: Secondary | ICD-10-CM

## 2015-07-18 DIAGNOSIS — I509 Heart failure, unspecified: Secondary | ICD-10-CM

## 2015-07-18 DIAGNOSIS — I2510 Atherosclerotic heart disease of native coronary artery without angina pectoris: Secondary | ICD-10-CM

## 2015-07-18 DIAGNOSIS — I110 Hypertensive heart disease with heart failure: Secondary | ICD-10-CM

## 2015-07-18 DIAGNOSIS — I482 Chronic atrial fibrillation: Secondary | ICD-10-CM

## 2015-07-18 DIAGNOSIS — I4821 Permanent atrial fibrillation: Secondary | ICD-10-CM

## 2015-07-18 DIAGNOSIS — I119 Hypertensive heart disease without heart failure: Secondary | ICD-10-CM | POA: Insufficient documentation

## 2015-07-18 DIAGNOSIS — E785 Hyperlipidemia, unspecified: Secondary | ICD-10-CM | POA: Insufficient documentation

## 2015-07-18 DIAGNOSIS — I5032 Chronic diastolic (congestive) heart failure: Secondary | ICD-10-CM

## 2015-07-18 DIAGNOSIS — I480 Paroxysmal atrial fibrillation: Secondary | ICD-10-CM

## 2015-07-18 MED ORDER — DILTIAZEM HCL ER COATED BEADS 120 MG PO CP24: 120.0000 mg | ORAL_CAPSULE | Freq: Every day | ORAL | Status: DC

## 2015-07-18 NOTE — Progress Notes (Signed)
Office Visit    Patient Name: Adam Roberson Date of Encounter: 07/18/2015  Primary Care Provider:  [facility] Primary Cardiologist:  Judie Petit. Kirke Corin, MD   Chief Complaint    82 year old male with prior history of CAD and A. fib who presents following recent hospital physician for pneumonia.  Past Medical History    Past Medical History  Diagnosis Date  . CAD (coronary artery disease)     a.2003 s/p CABG x3 in [place]; b. ? 2014 PCI (records not available).  . Parkinson disease (HCC)   . Hypertensive heart disease   . Hyperlipidemia   . DVT of leg (deep venous thrombosis) (HCC)   . H/O: CVA (cerebrovascular accident)   . Chronic diastolic CHF (congestive heart failure) (HCC)   . Permanent atrial fibrillation (HCC)     a. CHA2DS2VASc= 7-->coumadin (followed by primary care).   Past Surgical History  Procedure Laterality Date  . Cardiac surgery  age 40    bypass   Allergies  No Known Allergies  History of Present Illness  Patient seen with interpreter and wife today.    82 year old male with prior history of coronary artery disease status post remote coronary artery bypass grafting in [place]. He apparently underwent more recent evaluation within the past 3-5 years, the records are not available. He also appears to have a history of permanent atrial fibrillation, dating back at least 2 years. Again, history is not entirely clear. He was recently admitted to [facility] with dyspnea and respiratory failure in the setting of pneumonia and was found to be in A. fib with RVR. He was placed on beta blocker and diltiazem therapy with good rate control as respiratory status improved. Troponins were negative and echo showed normal LV function. He was continued on his home dose of Coumadin throughout hospitalization and subsequently discharged. Since his discharge, he has been feeling well. Occasionally has a headache but has not been having any chest pain, dyspnea, palpitations,  PND, orthopnea, dizziness, syncope, edema, or early satiety. His family does not weigh him regularly. He would like to come off of Lasix, as he was not on that previously and is currently taking it every other day.  Home Medications    Prior to Admission medications   Medication Sig Start Date End Date Taking? Authorizing Provider  aspirin 81 MG tablet Take 81 mg by mouth daily.   Yes Historical Provider, MD  atorvastatin (LIPITOR) 20 MG tablet Take 20 mg by mouth daily.   Yes Historical Provider, MD  carbidopa-levodopa (SINEMET IR) 25-250 MG tablet Take 1 tablet by mouth 3 (three) times daily. 07/05/15  Yes Srikar Sudini, MD  diltiazem (CARDIZEM CD) 120 MG 24 hr capsule Take 1 capsule (120 mg total) by mouth daily. 07/18/15  Yes Ok Anis, NP  furosemide (LASIX) 20 MG tablet Take 1 tablet (20 mg total) by mouth every other day. 07/05/15  Yes Srikar Sudini, MD  metoprolol (LOPRESSOR) 50 MG tablet Take 25 mg by mouth 2 (two) times daily. Takes 1/4 in the am and 1/4 in the pm   Yes Historical Provider, MD  warfarin (COUMADIN) 5 MG tablet Take 5 mg by mouth daily. Takes 1/2 tab Monday and Friday Takes 1 tab T, W, Th, Sat, Sun   Yes Historical Provider, MD    Review of Systems    As above, he has been doing well without chest pain, dyspnea, palpitations, PND, orthopnea, dizziness, syncope, edema, or early satiety. He occasionally has a  headache.  All other systems reviewed and are otherwise negative except as noted above.  Physical Exam    VS:  BP 122/70 mmHg  Pulse 63  Temp(Src) 98.5 F (36.9 C) (Oral)  Ht 5\' 2"  (1.575 m)  Wt  , BMI There is no weight on file to calculate BMI. GEN: Well nourished, well developed, in no acute distress. HEENT: normal. Neck: Supple, no JVD, carotid bruits, or masses. Cardiac: Irregularly irregular S1-S2 with a 3/6 systolic murmur loudest at the upper sternal borders but heard throughout, no rubs, or gallops. No clubbing, cyanosis, edema.  Radials/DP/PT  2+ and equal bilaterally.  Respiratory:  Respirations regular and unlabored, clear to auscultation bilaterally. GI: Soft, nontender, nondistended, BS + x 4. MS: no deformity or atrophy. Skin: warm and dry, no rash. Neuro:  Strength and sensation are intact. Psych: Normal affect.  Accessory Clinical Findings    ECG - Atrial fibrillation, 63, nonspecific ST changes, no acute findings.  Assessment & Plan    1.  Chronic diastolic CHF:  S/p recent hospitalization for PNA with finding of AF with RVR and mild volume overload.  He was diuresed and discharged home on lasix 20 every other day.  He is euvolemic on exam today.  His family has not been weighing him daily.  I have advised that he do so.  He would like to come off of lasix and I advised that so long as they're weighing him, he'd be ok to come off of lasix, provided they notify us of wt changes.  HR/BP stable  cont  blocker and dilt.  His wife notes that his BP runs up into the 160's sometimes at night and therefor, I will increase his lopressor to 25 BID.  2.  Permanent Atrial Fibrillation: well rate-controlled on  blocker and dilt.  Anticoagulated on coumadin and [facility]  3.  Hypertensive heart Disease:  BP stable today but wife notes that it trends up in the evening.  I am titrating his  blocker to 25 bid.  4.  CAD:  S/p prior cabg.  No chest pain.  Nl EF on recent echo.  Cont asa,  blocker.  Not currently on statin.  Could consider adding though benefit at advanced age is likely limited.  LDL 78 in March.  5.  Preoperative eval: pt pending eye surgery and apparently needs cardiac clearance.  He has not had c/p.  It's not clear as to when his last cardiac evaluation was, as it would have been in [place] and we do not have these records.  I will arrange for a lexican myoview to assess risk. Other issue will be anticoagulation.  His PMH indicates that he has a h/o CVA, though he and his wife aren't sure that this  is true.  If he has had a prior CVA, then he will require bridging if discontinuation of coumadin is required prior to surgery.  6.  Dispo:  Arrange MV for preop clearance.  F/U Dr. Kirke CorinArida in 3 mos or sooner if necessary.  Nicolasa Duckinghristopher Mckensi Redinger, NP 07/18/2015, 1:09 PM

## 2015-07-18 NOTE — Patient Instructions (Addendum)
Medication Instructions:  Please take metoprolol 50 mg 1/2 tab twice daily  Labwork: None  Testing/Procedures: [facility] MYOVIEW  Your caregiver has ordered a Stress Test with nuclear imaging. The purpose of this test is to evaluate the blood supply to your heart muscle. This procedure is referred to as a "Non-Invasive Stress Test." This is because other than having an IV started in your vein, nothing is inserted or "invades" your body. Cardiac stress tests are done to find areas of poor blood flow to the heart by determining the extent of coronary artery disease (CAD). Some patients exercise on a treadmill, which naturally increases the blood flow to your heart, while others who are  unable to walk on a treadmill due to physical limitations have a pharmacologic/chemical stress agent called Lexiscan . This medicine will mimic walking on a treadmill by temporarily increasing your coronary blood flow.   Please note: these test may take anywhere between 2-4 hours to complete  PLEASE REPORT TO [facility] MEDICAL MALL ENTRANCE  THE VOLUNTEERS AT THE FIRST DESK WILL DIRECT YOU WHERE TO GO  Date of Procedure:_____________________________________  Arrival Time for Procedure:______________________________  Instructions regarding medication:   __X__:  Hold METOPROLOL the night before and morning of procedure  How to prepare for your Myoview test:   Do not eat or drink after midnight  No caffeine for 24 hours prior to test  No smoking 24 hours prior to test.  Your medication may be taken with water.  If your doctor stopped a medication because of this test, do not take that medication.  Ladies, please do not wear dresses.  Skirts or pants are appropriate. Please wear a short sleeve shirt.  No perfume, cologne or lotion.  Follow-Up: 3 months with Dr. Kirke Corin  Date & time; __________________________   If you need a refill on your cardiac medications before your next appointment, please call your  pharmacy.    Cardiac Nuclear Scanning A cardiac nuclear scan is used to check your heart for problems, such as the following:  A portion of the heart is not getting enough blood.  Part of the heart muscle has died, which happens with a heart attack.  The heart wall is not working normally.  In this test, a radioactive dye (tracer) is injected into your bloodstream. After the tracer has traveled to your heart, a scanning device is used to measure how much of the tracer is absorbed by or distributed to various areas of your heart. LET [facility] CARE PROVIDER KNOW ABOUT:  Any allergies you have.  All medicines you are taking, including vitamins, herbs, eye drops, creams, and over-the-counter medicines.  Previous problems you or members of your family have had with the use of anesthetics.  Any blood disorders you have.  Previous surgeries you have had.  Medical conditions you have.  RISKS AND COMPLICATIONS Generally, this is a safe procedure. However, as with any procedure, problems can occur. Possible problems include:   Serious chest pain.  Rapid heartbeat.  Sensation of warmth in your chest. This usually passes quickly. BEFORE THE PROCEDURE Ask your health care provider about changing or stopping your regular medicines. PROCEDURE This procedure is usually done at a hospital and takes 2-4 hours.  An IV tube is inserted into one of your veins.  Your health care provider will inject a small amount of radioactive tracer through the tube.  You will then wait for 20-40 minutes while the tracer travels through your bloodstream.  You will lie  down on an exam table so images of your heart can be taken. Images will be taken for about 15-20 minutes.  You will exercise on a treadmill or stationary bike. While you exercise, your heart activity will be monitored with an electrocardiogram (ECG), and your blood pressure will be checked.  If you are unable to exercise, you may be  given a medicine to make your heart beat faster.  When blood flow to your heart has peaked, tracer will again be injected through the IV tube.  After 20-40 minutes, you will get back on the exam table and have more images taken of your heart.  When the procedure is over, your IV tube will be removed. AFTER THE PROCEDURE  You will likely be able to leave shortly after the test. Unless your health care provider tells you otherwise, you may return to your normal schedule, including diet, activities, and medicines.  Make sure you find out how and when you will get your test results.   This information is not intended to replace advice given to you by your health care provider. Make sure you discuss any questions you have with your health care provider.   Document Released: 03/27/2004 Document Revised: 03/07/2013 Document Reviewed: 02/08/2013 Elsevier Interactive Patient Education [employer]2016 Elsevier Inc.

## 2015-07-24 ENCOUNTER — Other Ambulatory Visit: Payer: Self-pay

## 2015-07-24 DIAGNOSIS — I10 Essential (primary) hypertension: Secondary | ICD-10-CM

## 2015-07-25 LAB — COMPREHENSIVE METABOLIC PANEL
ALT: 14 IU/L (ref 0–44)
AST: 23 IU/L (ref 0–40)
Albumin/Globulin Ratio: 1.4 (ref 1.2–2.2)
Albumin: 4 g/dL (ref 3.5–4.7)
Alkaline Phosphatase: 89 IU/L (ref 39–117)
BUN/Creatinine Ratio: 17 (ref 10–24)
BUN: 18 mg/dL (ref 8–27)
Bilirubin Total: 0.6 mg/dL (ref 0.0–1.2)
CALCIUM: 8.7 mg/dL (ref 8.6–10.2)
CHLORIDE: 100 mmol/L (ref 96–106)
CO2: 23 mmol/L (ref 18–29)
CREATININE: 1.03 mg/dL (ref 0.76–1.27)
GFR calc non Af Amer: 66 mL/min/{1.73_m2} (ref >59)
GFR, EST AFRICAN AMERICAN: 76 mL/min/{1.73_m2} (ref >59)
GLUCOSE: 133 mg/dL — AB (ref 65–99)
Globulin, Total: 2.8 g/dL (ref 1.5–4.5)
Potassium: 3.9 mmol/L (ref 3.5–5.2)
Sodium: 138 mmol/L (ref 134–144)
TOTAL PROTEIN: 6.8 g/dL (ref 6.0–8.5)

## 2015-07-25 LAB — CBC WITH DIFFERENTIAL/PLATELET
BASOS ABS: 0 10*3/uL (ref 0.0–0.2)
BASOS: 0 %
EOS (ABSOLUTE): 0.1 10*3/uL (ref 0.0–0.4)
Eos: 2 %
HEMOGLOBIN: 13.9 g/dL (ref 12.6–17.7)
Hematocrit: 40.7 % (ref 37.5–51.0)
IMMATURE GRANS (ABS): 0 10*3/uL (ref 0.0–0.1)
Immature Granulocytes: 0 %
LYMPHS: 18 %
Lymphocytes Absolute: 1.1 10*3/uL (ref 0.7–3.1)
MCH: 31 pg (ref 26.6–33.0)
MCHC: 34.2 g/dL (ref 31.5–35.7)
MCV: 91 fL (ref 79–97)
Monocytes Absolute: 0.6 10*3/uL (ref 0.1–0.9)
Monocytes: 9 %
NEUTROS ABS: 4.5 10*3/uL (ref 1.4–7.0)
Neutrophils: 71 %
Platelets: 177 10*3/uL (ref 150–379)
RBC: 4.49 x10E6/uL (ref 4.14–5.80)
RDW: 14.4 % (ref 12.3–15.4)
WBC: 6.4 10*3/uL (ref 3.4–10.8)

## 2015-07-25 LAB — URINALYSIS
BILIRUBIN UA: NEGATIVE
Glucose, UA: NEGATIVE
Ketones, UA: NEGATIVE
LEUKOCYTES UA: NEGATIVE
NITRITE UA: NEGATIVE
PH UA: 5.5 (ref 5.0–7.5)
SPEC GRAV UA: 1.017 (ref 1.005–1.030)
Urobilinogen, Ur: 0.2 mg/dL (ref 0.2–1.0)

## 2015-07-25 LAB — LIPID PANEL
CHOLESTEROL TOTAL: 180 mg/dL (ref 100–199)
Chol/HDL Ratio: 3.7 ratio units (ref 0.0–5.0)
HDL: 49 mg/dL (ref >39)
LDL Calculated: 108 mg/dL — ABNORMAL HIGH (ref 0–99)
TRIGLYCERIDES: 114 mg/dL (ref 0–149)
VLDL CHOLESTEROL CAL: 23 mg/dL (ref 5–40)

## 2015-07-25 LAB — PSA: Prostate Specific Ag, Serum: 0.6 ng/mL (ref 0.0–4.0)

## 2015-07-31 ENCOUNTER — Ambulatory Visit: Payer: Self-pay | Admitting: Internal Medicine

## 2015-07-31 ENCOUNTER — Encounter: Payer: Self-pay | Admitting: Internal Medicine

## 2015-07-31 ENCOUNTER — Other Ambulatory Visit: Payer: Self-pay

## 2015-07-31 VITALS — BP 114/72 | HR 67 | Temp 97.2°F

## 2015-07-31 DIAGNOSIS — I5033 Acute on chronic diastolic (congestive) heart failure: Secondary | ICD-10-CM

## 2015-07-31 DIAGNOSIS — Z9229 Personal history of other drug therapy: Secondary | ICD-10-CM

## 2015-07-31 DIAGNOSIS — I2510 Atherosclerotic heart disease of native coronary artery without angina pectoris: Secondary | ICD-10-CM

## 2015-07-31 DIAGNOSIS — G20 Parkinson's disease: Secondary | ICD-10-CM

## 2015-07-31 DIAGNOSIS — I259 Chronic ischemic heart disease, unspecified: Secondary | ICD-10-CM

## 2015-07-31 MED ORDER — DILTIAZEM HCL ER COATED BEADS 120 MG PO CP24: 120.0000 mg | ORAL_CAPSULE | Freq: Every day | ORAL | Status: DC

## 2015-07-31 MED ORDER — CARBIDOPA-LEVODOPA 25-250 MG PO TABS: 1.0000 | ORAL_TABLET | Freq: Three times a day (TID) | ORAL | Status: DC

## 2015-07-31 MED ORDER — WARFARIN SODIUM 5 MG PO TABS: 5.0000 mg | ORAL_TABLET | Freq: Every day | ORAL | Status: DC

## 2015-07-31 NOTE — Progress Notes (Signed)
Subjective:    Patient ID: Adam Roberson, male    DOB: 1928/08/05, 82 y.o.   MRN: [id number]  HPI  Patient Active Problem List   Diagnosis Date Noted  . Hypertensive heart disease   . Hyperlipidemia   . CAD (coronary artery disease)   . Permanent atrial fibrillation ([place])   . Atrial fibrillation with rapid ventricular response ([place]) 07/03/2015  . Bilateral pneumonia   . Dyspnea   . Persistent atrial fibrillation ([place])   . Acute on chronic diastolic CHF (congestive heart failure) ([place])   . Sepsis ([place]) 06/28/2015  . Parkinson disease ([place]) 04/09/2015  . IHD (ischemic heart disease) 10/31/2014   Pt was in the ED for pneumonia on 4/14. He feels better now.  He had headaches all over in the afternoon. He has been taking Advil and it has gotten better.  He has bruising on the lower extremities.    Review of Systems  Neurological: Positive for weakness and headaches.       Objective:   Physical Exam  Constitutional: He is oriented to person, place, and time.  Pulmonary/Chest: Effort normal and breath sounds normal.  Neurological: He is alert and oriented to person, place, and time.    BP 114/72 mmHg  Pulse 67  Temp(Src) 97.2 F (36.2 C)  Outpatient Encounter Prescriptions as of 07/31/2015  Medication Sig  . aspirin 81 MG tablet Take 81 mg by mouth daily.  Marland Kitchen atorvastatin (LIPITOR) 20 MG tablet Take 20 mg by mouth daily.  . carbidopa-levodopa (SINEMET IR) 25-250 MG tablet Take 1 tablet by mouth 3 (three) times daily.  Marland Kitchen diltiazem (CARDIZEM CD) 120 MG 24 hr capsule Take 1 capsule (120 mg total) by mouth daily.  . furosemide (LASIX) 20 MG tablet Take 1 tablet (20 mg total) by mouth every other day. (Patient taking differently: Take 20 mg by mouth as needed. )  . metoprolol (LOPRESSOR) 50 MG tablet Take 25 mg by mouth 2 (two) times daily. Takes 1/4 in the am and 1/4 in the pm  . warfarin (COUMADIN) 5 MG tablet Take 5 mg by mouth daily. Takes 1/2 tab Monday and  Friday Takes 1 tab T, W, Th, Sat, Sun   No facility-administered encounter medications on file as of 07/31/2015.       Assessment & Plan:  Advised to not use Advil for headaches as it works against his blood thinners. Advised to use Tylenol.  He may be bruising easily due to the blood thinners. Need to do a PT/INR.  He needs to keep taking the aspirin.   Advised to keep working with cardiology because he has a weak heart. Weakness, feeling faint, is a complication of Parkinson's. Told that medications cannot stop this feeling when patients get older. Blood pressure may be getting low and causing weakness as well.    Adam Roberson was seen today for hyperlipidemia and hypertension.  Diagnoses and all orders for this visit:  Hx of long term use of blood thinners -     INR/PT  Acute on chronic diastolic CHF (congestive heart failure) (HCC) -     CBC w/Diff; Future -     Comp Met (CMET); Future -     INR/PT; Future -     Urinalysis; Future  Coronary artery disease involving native coronary artery of native heart without angina pectoris  IHD (ischemic heart disease)  Parkinson disease ([place])  Other orders -     carbidopa-levodopa (SINEMET IR) 25-250 MG tablet; Take 1 tablet  by mouth 3 (three) times daily. -     diltiazem (CARDIZEM CD) 120 MG 24 hr capsule; Take 1 capsule (120 mg total) by mouth daily. -     warfarin (COUMADIN) 5 MG tablet; Take 1 tablet (5 mg total) by mouth daily. Takes 1/2 tab Monday and Friday Takes 1 tab T, W, Th, Sat, Sun   Protime today. Needs to be called about adjusting Warfarin dose based on INR as soon as available.   Md f/u in 90 days CBC, Met C, protime, UA

## 2015-08-01 LAB — PROTIME-INR
INR: 4.5 — ABNORMAL HIGH (ref 0.8–1.2)
Prothrombin Time: 45.5 s — ABNORMAL HIGH (ref 9.1–12.0)

## 2015-08-02 ENCOUNTER — Telehealth: Payer: Self-pay

## 2015-08-02 NOTE — Telephone Encounter (Signed)
-----   Message from Harle BattiestShannon A McGowan, PA-C sent at 08/01/2015  6:37 PM EDT ----- Patient will need to decrease his warfarin to 1/2 tablet every day and then recheck his INR/PT in one week.

## 2015-08-02 NOTE — Telephone Encounter (Signed)
I spoke with patients grandson Jarome LamasSahand and gave him the new instructions as well as a new PT?INR appt.

## 2015-08-08 ENCOUNTER — Other Ambulatory Visit: Payer: Self-pay

## 2015-08-08 DIAGNOSIS — I5033 Acute on chronic diastolic (congestive) heart failure: Secondary | ICD-10-CM

## 2015-08-09 LAB — COMPREHENSIVE METABOLIC PANEL
A/G RATIO: 1.4 (ref 1.2–2.2)
ALBUMIN: 4.1 g/dL (ref 3.5–4.7)
ALK PHOS: 80 IU/L (ref 39–117)
ALT: 21 IU/L (ref 0–44)
AST: 20 IU/L (ref 0–40)
BILIRUBIN TOTAL: 0.4 mg/dL (ref 0.0–1.2)
BUN / CREAT RATIO: 16 (ref 10–24)
BUN: 15 mg/dL (ref 8–27)
CHLORIDE: 98 mmol/L (ref 96–106)
CO2: 22 mmol/L (ref 18–29)
Calcium: 8.5 mg/dL — ABNORMAL LOW (ref 8.6–10.2)
Creatinine, Ser: 0.95 mg/dL (ref 0.76–1.27)
GFR calc Af Amer: 84 mL/min/{1.73_m2} (ref >59)
GFR calc non Af Amer: 73 mL/min/{1.73_m2} (ref >59)
Globulin, Total: 3 g/dL (ref 1.5–4.5)
Glucose: 117 mg/dL — ABNORMAL HIGH (ref 65–99)
POTASSIUM: 4.1 mmol/L (ref 3.5–5.2)
Sodium: 138 mmol/L (ref 134–144)
Total Protein: 7.1 g/dL (ref 6.0–8.5)

## 2015-08-09 LAB — URINALYSIS
Bilirubin, UA: NEGATIVE
GLUCOSE, UA: NEGATIVE
KETONES UA: NEGATIVE
LEUKOCYTES UA: NEGATIVE
NITRITE UA: NEGATIVE
SPEC GRAV UA: 1.015 (ref 1.005–1.030)
Urobilinogen, Ur: 0.2 mg/dL (ref 0.2–1.0)
pH, UA: 5.5 (ref 5.0–7.5)

## 2015-08-09 LAB — CBC WITH DIFFERENTIAL/PLATELET
BASOS: 1 %
Basophils Absolute: 0 10*3/uL (ref 0.0–0.2)
EOS (ABSOLUTE): 0.2 10*3/uL (ref 0.0–0.4)
EOS: 2 %
HEMATOCRIT: 42.6 % (ref 37.5–51.0)
HEMOGLOBIN: 14.4 g/dL (ref 12.6–17.7)
IMMATURE GRANS (ABS): 0 10*3/uL (ref 0.0–0.1)
IMMATURE GRANULOCYTES: 0 %
LYMPHS: 21 %
Lymphocytes Absolute: 1.4 10*3/uL (ref 0.7–3.1)
MCH: 30.7 pg (ref 26.6–33.0)
MCHC: 33.8 g/dL (ref 31.5–35.7)
MCV: 91 fL (ref 79–97)
Monocytes Absolute: 0.6 10*3/uL (ref 0.1–0.9)
Monocytes: 9 %
NEUTROS ABS: 4.4 10*3/uL (ref 1.4–7.0)
NEUTROS PCT: 67 %
PLATELETS: 214 10*3/uL (ref 150–379)
RBC: 4.69 x10E6/uL (ref 4.14–5.80)
RDW: 14.6 % (ref 12.3–15.4)
WBC: 6.5 10*3/uL (ref 3.4–10.8)

## 2015-08-14 ENCOUNTER — Ambulatory Visit: Payer: Self-pay

## 2015-08-15 ENCOUNTER — Ambulatory Visit: Payer: Self-pay

## 2015-08-15 ENCOUNTER — Other Ambulatory Visit: Payer: Self-pay

## 2015-08-15 DIAGNOSIS — I25119 Atherosclerotic heart disease of native coronary artery with unspecified angina pectoris: Secondary | ICD-10-CM

## 2015-08-15 LAB — POCT INR: INR: 1.4

## 2015-08-16 ENCOUNTER — Telehealth: Payer: Self-pay | Admitting: Cardiovascular Disease

## 2015-08-16 NOTE — Telephone Encounter (Signed)
Pt grandson calling states pt is having cataract surgery on 6/13. Needs clearance. Please call.

## 2015-08-19 NOTE — Telephone Encounter (Signed)
S/w pt daughter, Elton Sinlham [place], ([place] who requests to schedule myoview. Pt had May OV w/Chris Berge at which time, lexi Celine Ahrmyoview was ordered for pre-op clearance. Pt having cataract surgery June 13 at [facility]. Daughter is agreeable w/June 9, 8:30am and understands arrival 8:15am Reviewed pre-procedure  instructions w/Elham who verbalized understanding. Message to pre-cert, need for interpreter noted and called [facility] Dorothyann Gibbs([place]) to confirm that they are aware.

## 2015-08-21 ENCOUNTER — Telehealth: Payer: Self-pay | Admitting: Nurse Practitioner

## 2015-08-21 NOTE — Telephone Encounter (Signed)
Per Charmaine pre-cert for 6/9 myoview:  PT'S MCD IS NOT ACTIVE FOR JUNE 2017  ?YELLOW CARD CH  SELF PAY   S/w pt daughter, Woody Sellerlham, who inquires as to the self-pay cost. Per Charmaine,  "APPROXIMATE ESTIMATION ONLY is $5,000.   However, since pt is self pay, they can receive a 50% discount if they pay the total amount due (approx 2,500) "  Spoke again w/Elham who reports pt received Medicaid card in mail today. Grandson provided insurance information: case identifier, [id number] #[id number] Resubmitted pre-cert request w/this information.

## 2015-08-21 NOTE — Telephone Encounter (Signed)
Per Charmaine: "  Adam Roberson looked up medicaid and not still not active. Hopefully they reinstate his mcd and he wont end up paying.     S/w pt daughter, Adam Roberson, w/new information and informed her that pt could possibly be responsible for approximately $2500.  She repeated this amount back to me and verbalized understanding. States she will discuss with her mother and father (pt) and will call back as to whether to proceed w/June 9 myoview.

## 2015-08-22 ENCOUNTER — Other Ambulatory Visit: Payer: Self-pay

## 2015-08-22 DIAGNOSIS — I5033 Acute on chronic diastolic (congestive) heart failure: Secondary | ICD-10-CM

## 2015-08-22 LAB — POCT INR: INR: 2

## 2015-08-22 NOTE — Telephone Encounter (Signed)
S/w pt daughter, Woody Sellerlham, who confirmed pt will keep myoview appt tomorrow and understands pt could be responsible for +/- $2500 if Medicaid is not approved or does not pay. Reviewed myoview instructions w/daughter who verbalized understanding. Pt having eye surgery June 13. As we have not yet received request for cardiac clearance, I called Arline Aspindy at [facility]. She faxed clearance and understands form may not be completed until Monday, June 12. Per our records, pt takes warfarin which is not monitored by our office. Arline AspCindy states pt will go to [facility] today for advice on warfarin.

## 2015-08-23 ENCOUNTER — Telehealth: Payer: Self-pay | Admitting: Cardiovascular Disease

## 2015-08-23 ENCOUNTER — Encounter
Admission: RE | Admit: 2015-08-23 | Discharge: 2015-08-23 | Disposition: A | Discharge status: Home / Self Care | Payer: Self-pay | Source: Ambulatory Visit | Attending: Nurse Practitioner | Admitting: Nurse Practitioner

## 2015-08-23 DIAGNOSIS — I482 Chronic atrial fibrillation: Secondary | ICD-10-CM | POA: Insufficient documentation

## 2015-08-23 DIAGNOSIS — I5032 Chronic diastolic (congestive) heart failure: Secondary | ICD-10-CM | POA: Insufficient documentation

## 2015-08-23 DIAGNOSIS — I4821 Permanent atrial fibrillation: Secondary | ICD-10-CM

## 2015-08-23 DIAGNOSIS — I510 Cardiac septal defect, acquired: Secondary | ICD-10-CM | POA: Insufficient documentation

## 2015-08-23 LAB — NM MYOCAR MULTI W/SPECT W/WALL MOTION / EF
CHL CUP MPHR: 135 {beats}/min
CHL CUP NUCLEAR SSS: 9
CSEPEW: 1 METS
CSEPHR: 102 %
CSEPPHR: 139 {beats}/min
Exercise duration (min): 0 min
Exercise duration (sec): 0 s
LVDIAVOL: 52 mL (ref 62–150)
LVSYSVOL: 13 mL
Rest HR: 86 {beats}/min
SDS: 6
SRS: 4
TID: 1

## 2015-08-23 MED ORDER — TECHNETIUM TC 99M TETROFOSMIN IV KIT
31.0300 | PACK | Freq: Once | INTRAVENOUS | Status: AC | PRN
  Administered 2015-08-23: 31.03 via INTRAVENOUS

## 2015-08-23 MED ORDER — REGADENOSON 0.4 MG/5ML IV SOLN
0.4000 mg | Freq: Once | INTRAVENOUS | Status: AC
  Administered 2015-08-23: 0.4 mg via INTRAVENOUS

## 2015-08-23 MED ORDER — TECHNETIUM TC 99M TETROFOSMIN IV KIT
13.5000 | PACK | Freq: Once | INTRAVENOUS | Status: AC | PRN
  Administered 2015-08-23: 13.5 via INTRAVENOUS

## 2015-08-23 NOTE — Telephone Encounter (Signed)
Faxed cardiac clearance to [facility], [phone], for 6/13 procedure.

## 2015-08-27 ENCOUNTER — Ambulatory Visit
Admission: RE | Admit: 2015-08-27 | Discharge: 2015-08-27 | Disposition: A | Discharge status: Home / Self Care | Payer: Self-pay | Source: Ambulatory Visit | Attending: Ophthalmology | Admitting: Ophthalmology

## 2015-08-27 ENCOUNTER — Encounter
Admission: RE | Disposition: A | Discharge status: Home / Self Care | Payer: Self-pay | Source: Ambulatory Visit | Attending: Ophthalmology

## 2015-08-27 ENCOUNTER — Ambulatory Visit: Payer: Self-pay | Admitting: Anesthesiology

## 2015-08-27 ENCOUNTER — Encounter: Payer: Self-pay | Admitting: *Deleted

## 2015-08-27 DIAGNOSIS — G20 Parkinson's disease: Secondary | ICD-10-CM | POA: Insufficient documentation

## 2015-08-27 DIAGNOSIS — Z86718 Personal history of other venous thrombosis and embolism: Secondary | ICD-10-CM | POA: Insufficient documentation

## 2015-08-27 DIAGNOSIS — H2512 Age-related nuclear cataract, left eye: Secondary | ICD-10-CM | POA: Insufficient documentation

## 2015-08-27 DIAGNOSIS — Z79899 Other long term (current) drug therapy: Secondary | ICD-10-CM | POA: Insufficient documentation

## 2015-08-27 DIAGNOSIS — Z8701 Personal history of pneumonia (recurrent): Secondary | ICD-10-CM | POA: Insufficient documentation

## 2015-08-27 DIAGNOSIS — H9193 Unspecified hearing loss, bilateral: Secondary | ICD-10-CM | POA: Insufficient documentation

## 2015-08-27 DIAGNOSIS — Z7982 Long term (current) use of aspirin: Secondary | ICD-10-CM | POA: Insufficient documentation

## 2015-08-27 DIAGNOSIS — I2510 Atherosclerotic heart disease of native coronary artery without angina pectoris: Secondary | ICD-10-CM | POA: Insufficient documentation

## 2015-08-27 DIAGNOSIS — Z951 Presence of aortocoronary bypass graft: Secondary | ICD-10-CM | POA: Insufficient documentation

## 2015-08-27 DIAGNOSIS — E7800 Pure hypercholesterolemia, unspecified: Secondary | ICD-10-CM | POA: Insufficient documentation

## 2015-08-27 DIAGNOSIS — Z7901 Long term (current) use of anticoagulants: Secondary | ICD-10-CM | POA: Insufficient documentation

## 2015-08-27 DIAGNOSIS — I10 Essential (primary) hypertension: Secondary | ICD-10-CM | POA: Insufficient documentation

## 2015-08-27 DIAGNOSIS — Z87891 Personal history of nicotine dependence: Secondary | ICD-10-CM | POA: Insufficient documentation

## 2015-08-27 HISTORY — DX: Unspecified hearing loss, unspecified ear: H91.90

## 2015-08-27 HISTORY — PX: CATARACT EXTRACTION W/PHACO: SHX586

## 2015-08-27 HISTORY — DX: Acute embolism and thrombosis of unspecified deep veins of unspecified lower extremity: I82.409

## 2015-08-27 HISTORY — DX: Pneumonia, unspecified organism: J18.9

## 2015-08-27 SURGERY — PHACOEMULSIFICATION, CATARACT, WITH IOL INSERTION
Anesthesia: Monitor Anesthesia Care | Site: Eye | Laterality: Left | Wound class: Clean

## 2015-08-27 MED ORDER — TETRACAINE HCL 0.5 % OP SOLN
1.0000 [drp] | Freq: Once | OPHTHALMIC | Status: AC
  Administered 2015-08-27: 1 [drp] via OPHTHALMIC

## 2015-08-27 MED ORDER — EPINEPHRINE HCL 1 MG/ML IJ SOLN
INTRAOCULAR | Status: DC | PRN
  Administered 2015-08-27: 1 mL via OPHTHALMIC

## 2015-08-27 MED ORDER — NEOMYCIN-POLYMYXIN-DEXAMETH 0.1 % OP OINT
TOPICAL_OINTMENT | OPHTHALMIC | Status: DC | PRN
  Administered 2015-08-27: 1 via OPHTHALMIC

## 2015-08-27 MED ORDER — BUPIVACAINE HCL (PF) 0.75 % IJ SOLN
INTRAMUSCULAR | Status: AC
Start: 2015-08-27 — End: 2015-08-27
  Filled 2015-08-27: qty 10

## 2015-08-27 MED ORDER — NA HYALUR & NA CHOND-NA HYALUR 0.4-0.35 ML IO KIT
PACK | INTRAOCULAR | Status: DC | PRN
  Administered 2015-08-27: 1 mL via INTRAOCULAR

## 2015-08-27 MED ORDER — SODIUM CHLORIDE 0.9 % IV SOLN
INTRAVENOUS | Status: DC
  Administered 2015-08-27: 07:00:00 via INTRAVENOUS

## 2015-08-27 MED ORDER — POVIDONE-IODINE 5 % OP SOLN
OPHTHALMIC | Status: AC
  Administered 2015-08-27: 1 via OPHTHALMIC
  Filled 2015-08-27: qty 30

## 2015-08-27 MED ORDER — CEFUROXIME OPHTHALMIC INJECTION 1 MG/0.1 ML
INJECTION | OPHTHALMIC | Status: DC | PRN
  Administered 2015-08-27: .1 mL via INTRACAMERAL

## 2015-08-27 MED ORDER — POVIDONE-IODINE 5 % OP SOLN
1.0000 "application " | Freq: Once | OPHTHALMIC | Status: AC
  Administered 2015-08-27: 1 via OPHTHALMIC

## 2015-08-27 MED ORDER — MOXIFLOXACIN HCL 0.5 % OP SOLN
OPHTHALMIC | Status: AC
  Filled 2015-08-27: qty 3

## 2015-08-27 MED ORDER — ARMC OPHTHALMIC DILATING GEL
OPHTHALMIC | Status: AC
  Administered 2015-08-27: 1 via OPHTHALMIC
  Filled 2015-08-27: qty 0.25

## 2015-08-27 MED ORDER — CARBACHOL 0.01 % IO SOLN
INTRAOCULAR | Status: DC | PRN
  Administered 2015-08-27: .5 mL via INTRAOCULAR

## 2015-08-27 MED ORDER — LIDOCAINE HCL (PF) 4 % IJ SOLN
INTRAMUSCULAR | Status: AC
  Filled 2015-08-27: qty 5

## 2015-08-27 MED ORDER — ARMC OPHTHALMIC DILATING GEL
1.0000 "application " | OPHTHALMIC | Status: DC | PRN
  Administered 2015-08-27 (×2): 1 via OPHTHALMIC

## 2015-08-27 MED ORDER — ALFENTANIL 500 MCG/ML IJ INJ
INJECTION | INTRAMUSCULAR | Status: DC | PRN
  Administered 2015-08-27: 500 ug via INTRAVENOUS

## 2015-08-27 MED ORDER — LIDOCAINE HCL (PF) 4 % IJ SOLN
INTRAMUSCULAR | Status: DC | PRN
  Administered 2015-08-27: 4 mL via OPHTHALMIC

## 2015-08-27 MED ORDER — EPINEPHRINE HCL 1 MG/ML IJ SOLN
INTRAMUSCULAR | Status: AC
  Filled 2015-08-27: qty 1

## 2015-08-27 MED ORDER — NA HYALUR & NA CHOND-NA HYALUR 0.55-0.5 ML IO KIT
PACK | INTRAOCULAR | Status: AC
  Filled 2015-08-27: qty 1.05

## 2015-08-27 MED ORDER — CEFUROXIME OPHTHALMIC INJECTION 1 MG/0.1 ML
INJECTION | OPHTHALMIC | Status: AC
Start: 2015-08-27 — End: 2015-08-27
  Filled 2015-08-27: qty 0.1

## 2015-08-27 MED ORDER — MOXIFLOXACIN HCL 0.5 % OP SOLN: 1.0000 [drp] | Freq: Once | OPHTHALMIC | Status: DC

## 2015-08-27 MED ORDER — NEOMYCIN-POLYMYXIN-DEXAMETH 3.5-10000-0.1 OP OINT
TOPICAL_OINTMENT | OPHTHALMIC | Status: AC
Start: 2015-08-27 — End: 2015-08-27
  Filled 2015-08-27: qty 3.5

## 2015-08-27 MED ORDER — TETRACAINE HCL 0.5 % OP SOLN
OPHTHALMIC | Status: AC
  Administered 2015-08-27: 1 [drp] via OPHTHALMIC
  Filled 2015-08-27: qty 2

## 2015-08-27 SURGICAL SUPPLY — 21 items
CANNULA ANT/CHMB 27GA (MISCELLANEOUS) ×3 IMPLANT
CUP MEDICINE 2OZ PLAST GRAD ST (MISCELLANEOUS) ×3 IMPLANT
GLOVE BIO SURGEON STRL SZ8 (GLOVE) ×3 IMPLANT
GLOVE BIOGEL M 6.5 STRL (GLOVE) ×3 IMPLANT
GLOVE SURG LX 7.5 STRW (GLOVE) ×2
GLOVE SURG LX STRL 7.5 STRW (GLOVE) ×1 IMPLANT
GOWN STRL REUS W/ TWL LRG LVL3 (GOWN DISPOSABLE) ×2 IMPLANT
GOWN STRL REUS W/TWL LRG LVL3 (GOWN DISPOSABLE) ×4
LENS IOL TECNIS ITEC 24.0 (Intraocular Lens) ×3 IMPLANT
PACK CATARACT (MISCELLANEOUS) ×3 IMPLANT
PACK CATARACT BRASINGTON LX (MISCELLANEOUS) ×3 IMPLANT
PACK EYE AFTER SURG (MISCELLANEOUS) ×3 IMPLANT
SOL BSS BAG (MISCELLANEOUS) ×3
SOL PREP PVP 2OZ (MISCELLANEOUS) ×3
SOLUTION BSS BAG (MISCELLANEOUS) ×1 IMPLANT
SOLUTION PREP PVP 2OZ (MISCELLANEOUS) ×1 IMPLANT
SYR 3ML LL SCALE MARK (SYRINGE) ×3 IMPLANT
SYR 5ML LL (SYRINGE) ×3 IMPLANT
SYR TB 1ML 27GX1/2 LL (SYRINGE) ×3 IMPLANT
WATER STERILE IRR 1000ML POUR (IV SOLUTION) ×3 IMPLANT
WIPE NON LINTING 3.25X3.25 (MISCELLANEOUS) ×3 IMPLANT

## 2015-08-27 NOTE — Transfer of Care (Signed)
Immediate Anesthesia Transfer of Care Note  Patient: Adam Roberson  Procedure(s) Performed: Procedure(s) with comments: CATARACT EXTRACTION PHACO AND INTRAOCULAR LENS PLACEMENT (IOC) (Left) - US 00:56 AP% 11.6 CDE 6.45 fluid pack lot # [id number]  Patient Location: PACU  Anesthesia Type:MAC  Level of Consciousness: awake  Airway & Oxygen Therapy: Patient Spontanous Breathing  Post-op Assessment: Post -op Vital signs reviewed and stable  Post vital signs: stable  Last Vitals:  Filed Vitals:   08/27/15 0614 08/27/15 0812  BP: 160/89 141/70  Pulse: 73 63  Temp: 36.8 C 36.7 C  Resp: 18 12    Last Pain: There were no vitals filed for this visit.       Complications: No apparent anesthesia complications

## 2015-08-27 NOTE — Discharge Instructions (Signed)
Eye Surgery Discharge Instructions  Expect mild scratchy sensation or mild soreness. DO NOT RUB YOUR EYE!  The day of surgery:  Minimal physical activity, but bed rest is not required  No reading, computer work, or close hand work  No bending, lifting, or straining.  May watch TV  For 24 hours:  No driving, legal decisions, or alcoholic beverages  Safety precautions  Eat anything you prefer: It is better to start with liquids, then soup then solid foods.  _____ Eye patch should be worn until postoperative exam tomorrow.  ____ Solar shield eyeglasses should be worn for comfort in the sunlight/patch while sleeping  Resume all regular medications including aspirin or Coumadin if these were discontinued prior to surgery. You may shower, bathe, shave, or wash your hair. Tylenol may be taken for mild discomfort.  Call your doctor if you experience significant pain, nausea, or vomiting, fever > 101 or other signs of infection. [phone] or [phone] Specific instructions:  Follow-up Information    Follow up with Lockie MolaBRASINGTON,CHADWICK, MD In 1 day.   Specialty:  Ophthalmology   Why:  June 13 at 3:30pm   Contact information:   [address]   [place] [address] [phone]

## 2015-08-27 NOTE — H&P (Signed)
  The History and Physical notes are on paper, have been signed, and are to be scanned. The patient remains stable and unchanged from the H&P.   Previous H&P reviewed, patient examined, and there are no changes.  Adam Roberson 08/27/2015 7:45 AM  

## 2015-08-27 NOTE — Anesthesia Postprocedure Evaluation (Signed)
Anesthesia Post Note  Patient: Adam Roberson  Procedure(s) Performed: Procedure(s) (LRB): CATARACT EXTRACTION PHACO AND INTRAOCULAR LENS PLACEMENT (IOC) (Left)  Patient location during evaluation: PACU Anesthesia Type: MAC Level of consciousness: awake and alert and oriented Pain management: pain level controlled Vital Signs Assessment: post-procedure vital signs reviewed and stable Respiratory status: spontaneous breathing Cardiovascular status: stable Anesthetic complications: no    Last Vitals:  Filed Vitals:   08/27/15 0811 08/27/15 0812  BP: 141/70 141/70  Pulse: 61 63  Temp: 36.7 C 36.7 C  Resp: 16 12    Last Pain: There were no vitals filed for this visit.               Rica MastBachich,  Raynah Gomes M

## 2015-08-27 NOTE — Op Note (Signed)
  PREOPERATIVE DIAGNOSIS:  Nuclear sclerotic cataract left eye.  H25.12  POSTOPERATIVE DIAGNOSIS:  Nuclear sclerotic cataract left eye.   PROCEDURE:  Phacoemulsification with posterior chamber intraocular lens implantation of the left eye.   LENS:  Implant Name Type Inv. Item Serial No. Manufacturer Lot No. LRB No. Used  LENS IOL DIOP 24.0 - [id number] Intraocular Lens LENS IOL DIOP 24.0 [id number] AMO   Left 1     ULTRASOUND TIME:  12% of 0 minutes 56 seconds, CDE 6.5 SURGEON:  Deirdre Evenerhadwick R. Ceciley Buist, MD   ANESTHESIA:  Retrobulbar block of Xylocaine and Bupivacaine.   COMPLICATIONS:  None.   DESCRIPTION OF PROCEDURE:  The patient was identified in the holding room and transported to the operating room and placed in the supine position under the operating microscope.  The left eye was identified as the operative eye and a retrobulbar block was performed under intravenous sedation.  It was then prepped and draped in the usual sterile ophthalmic fashion.   A 1 millimeter clear-corneal paracentesis was made at the 1:30 position.  The anterior chamber was filled with Viscoat viscoelastic.  A 2.4 millimeter keratome was used to make a near-clear corneal incision at the 10:30 position.  A curvilinear capsulorrhexis was made with a cystotome and capsulorrhexis forceps.  Balanced salt solution was used to hydrodissect and hydrodelineate the nucleus.   Phacoemulsification was then used in stop and chop fashion to remove the lens nucleus and epinucleus.  The remaining cortex was then removed using the irrigation and aspiration handpiece.  Provisc was then placed into the capsular bag to distend it for lens placement.  A 24.0 -diopter lens was then injected into the capsular bag.  The remaining viscoelastic was aspirated.   Wounds were hydrated with balanced salt solution.  The anterior chamber was inflated to a physiologic pressure with balanced salt solution.  No wound leaks were noted. Cefuroxime  0.1 ml of a 10mg /ml solution was injected into the anterior chamber for a dose of 1 mg of intracameral antibiotic at the completion of the case.  Miostat was placed in the anterior chamber.  Vigamox and maxitrol ointment were placed on the eye..  The eye was patched.  The patient was taken to the recovery room in stable condition without complications of anesthesia or surgery.   Adam Roberson 08/27/2015, 8:10 AM

## 2015-08-27 NOTE — Anesthesia Preprocedure Evaluation (Addendum)
Anesthesia Evaluation  Patient identified by MRN, date of birth, ID band Patient awake    Reviewed: Allergy & Precautions, NPO status , Patient's Chart, lab work & pertinent test results, reviewed documented beta blocker date and time   Airway Mallampati: II  TM Distance: >3 FB     Dental  (+) Chipped   Pulmonary shortness of breath, pneumonia, resolved,           Cardiovascular hypertension, Pt. on medications and Pt. on home beta blockers + CAD, + Peripheral Vascular Disease and +CHF  + dysrhythmias Atrial Fibrillation      Neuro/Psych CVA, Residual Symptoms    GI/Hepatic   Endo/Other    Renal/GU      Musculoskeletal   Abdominal   Peds  Hematology   Anesthesia Other Findings Parkinson's. CABG in 2003. Echo OK. Has some R sided weakness.  Reproductive/Obstetrics                            Anesthesia Physical Anesthesia Plan  ASA: III  Anesthesia Plan: MAC   Post-op Pain Management:    Induction:   Airway Management Planned:   Additional Equipment:   Intra-op Plan:   Post-operative Plan:   Informed Consent: I have reviewed the patients History and Physical, chart, labs and discussed the procedure including the risks, benefits and alternatives for the proposed anesthesia with the patient or authorized representative who has indicated his/her understanding and acceptance.     Plan Discussed with: CRNA  Anesthesia Plan Comments:         Anesthesia Quick Evaluation

## 2015-10-08 ENCOUNTER — Other Ambulatory Visit: Payer: Self-pay

## 2015-10-08 DIAGNOSIS — I482 Chronic atrial fibrillation, unspecified: Secondary | ICD-10-CM

## 2015-10-15 ENCOUNTER — Other Ambulatory Visit: Payer: Self-pay

## 2015-10-16 ENCOUNTER — Other Ambulatory Visit: Payer: Self-pay

## 2015-10-16 DIAGNOSIS — I482 Chronic atrial fibrillation, unspecified: Secondary | ICD-10-CM

## 2015-10-16 LAB — POCT INR: INR: 2.5

## 2015-10-17 ENCOUNTER — Ambulatory Visit: Payer: Self-pay

## 2015-10-18 ENCOUNTER — Ambulatory Visit (INDEPENDENT_AMBULATORY_CARE_PROVIDER_SITE_OTHER): Payer: Self-pay | Admitting: Cardiovascular Disease

## 2015-10-18 ENCOUNTER — Encounter: Payer: Self-pay | Admitting: Cardiovascular Disease

## 2015-10-18 VITALS — BP 116/70 | HR 70 | Ht 62.0 in | Wt 142.0 lb

## 2015-10-18 DIAGNOSIS — I5032 Chronic diastolic (congestive) heart failure: Secondary | ICD-10-CM

## 2015-10-18 DIAGNOSIS — I482 Chronic atrial fibrillation, unspecified: Secondary | ICD-10-CM

## 2015-10-18 DIAGNOSIS — R0602 Shortness of breath: Secondary | ICD-10-CM

## 2015-10-18 NOTE — Patient Instructions (Signed)
Medication Instructions:  Your physician has recommended you make the following change in your medication:  STOP taking aspirin   Labwork: none  Testing/Procedures: none  Follow-Up: Your physician wants you to follow-up in: 4 months with Dr. Kirke Corin.  You will receive a reminder letter in the mail two months in advance. If you don't receive a letter, please call our office to schedule the follow-up appointment.   Any Other Special Instructions Will Be Listed Below (If Applicable).     If you need a refill on your cardiac medications before your next appointment, please call your pharmacy.

## 2015-10-18 NOTE — Progress Notes (Signed)
Cardiology Office Note   Date:  10/18/2015   ID:  Adam Roberson, DOB 1928/07/30, MRN [id number]  PCP:  [facility]  Cardiologist:   Lorine Bears, MD   Chief Complaint  Patient presents with  . Other    C/o sob. Meds reviewed verbally with  pt.      History of Present Illness: Adam Roberson is a 82 y.o. male who presents for a follow-up visit regarding coronary artery disease, chronic diastolic heart failure and atrial fibrillation. Remote history of CABG in [place]. History of permanent atrial fibrillation. He was hospitalized in April with pneumonia and was found to have atrial fibrillation with rapid ventricular response. Echocardiogram showed normal LV systolic function. Rate control was achieved with metoprolol and diltiazem. He complained of atypical chest pain. He underwent a nuclear stress test in June which showed normal LV systolic function. Perfusion showed possible basal inferolateral and anterolateral ischemia. He underwent cataract surgery without complications. He is doing reasonably well and denies any chest pain. No palpitations. He continues to be weak.    Past Medical History:  Diagnosis Date  . CAD (coronary artery disease)    a.2003 s/p CABG x3 in [place]; b. ? 2014 PCI (records not available).  . Chronic diastolic CHF (congestive heart failure) (HCC)    a. 06/2015 Echo: EF 60-65%, mod dil LA, nl RV, mild to mod TR, PASP .  Marland Kitchen DVT (deep venous thrombosis) (HCC)   . DVT of leg (deep venous thrombosis) (HCC)   . Dysrhythmia   . H/O: CVA (cerebrovascular accident)   . HOH (hard of hearing)   . Hyperlipidemia   . Hypertension   . Hypertensive heart disease   . Parkinson disease (HCC)   . Permanent atrial fibrillation (HCC)    a. CHA2DS2VASc= 7-->coumadin (followed by primary care).  . Pneumonia     Past Surgical History:  Procedure Laterality Date  . CARDIAC SURGERY  age 22   bypass  . CATARACT EXTRACTION W/PHACO Left 08/27/2015     Procedure: CATARACT EXTRACTION PHACO AND INTRAOCULAR LENS PLACEMENT (IOC);  Surgeon: Lockie Mola, MD;  Location: [facility] ORS;  Service: Ophthalmology;  Laterality: Left;  [place] 00:56 AP% 11.6 CDE 6.45 fluid pack lot # [id number]  . CORONARY ARTERY BYPASS GRAFT    . dvt       Current Outpatient Prescriptions  Medication Sig Dispense Refill  . atorvastatin (LIPITOR) 20 MG tablet Take 20 mg by mouth daily.    . carbidopa-levodopa (SINEMET IR) 25-250 MG tablet Take 1 tablet by mouth 3 (three) times daily. 90 tablet 11  . diltiazem (CARDIZEM CD) 120 MG 24 hr capsule Take 1 capsule (120 mg total) by mouth daily. 30 capsule 6  . furosemide (LASIX) 20 MG tablet Take 1 tablet (20 mg total) by mouth every other day. (Patient taking differently: Take 20 mg by mouth as needed. ) 30 tablet 0  . metoprolol (LOPRESSOR) 50 MG tablet Take 50 mg by mouth. Takes 1/2 tab BID    . warfarin (COUMADIN) 5 MG tablet Take 1 tablet (5 mg total) by mouth daily. Takes 1/2 tab Monday and Friday Takes 1 tab T, W, Th, Sat, Sun 30 tablet 5   No current facility-administered medications for this visit.     Allergies:   Review of patient's allergies indicates no known allergies.    Social History:  The patient  reports that he has never smoked. He does not have any smokeless tobacco history on file. He  reports that he does not drink alcohol or use drugs.   Family History:  The patient's family history includes CAD in his mother.    ROS:  Please see the history of present illness.   Otherwise, review of systems are positive for none.   All other systems are reviewed and negative.    PHYSICAL EXAM: VS:  BP 116/70 (BP Location: Left Arm, Patient Position: Sitting, Cuff Size: Normal)   Pulse 70   Ht  (1.575 m)   Wt 142 lb (64.4 kg)   BMI 25.97 kg/m  , BMI Body mass index is 25.97 kg/m. GEN: Well nourished, well developed, in no acute distress  HEENT: normal  Neck: no JVD, carotid bruits, or  masses Cardiac: Irregularly irregular; no murmurs, rubs, or gallops,no edema  Respiratory:  clear to auscultation bilaterally, normal work of breathing GI: soft, nontender, nondistended, + BS MS: no deformity or atrophy  Skin: warm and dry, no rash Neuro:  Strength and sensation are intact Psych: euthymic mood, full affect   EKG:  EKG is ordered today. The ekg ordered today demonstrates atrial fibrillation with possible old anterior infarct.   Recent Labs: 04/09/2015: TSH 0.85 06/29/2015: B Natriuretic Peptide 154.0 07/03/2015: Hemoglobin 15.1 08/08/2015: ALT 21; BUN 15; Creatinine, Ser 0.95; Platelets 214; Potassium 4.1; Sodium 138    Lipid Panel    Component Value Date/Time   CHOL 180 07/24/2015 1138   TRIG 114 07/24/2015 1138   HDL 49 07/24/2015 1138   CHOLHDL 3.7 07/24/2015 1138   LDLCALC 108 (H) 07/24/2015 1138      Wt Readings from Last 3 Encounters:  10/18/15 142 lb (64.4 kg)  08/27/15 154 lb (69.9 kg)  06/28/15 154 lb 5.2 oz (70 kg)        ASSESSMENT AND PLAN:  1.  Chronic diastolic heart failure: The patient appears to be euvolemic and he is using furosemide only as needed.  2. Chronic atrial fibrillation: Ventricular rate is controlled on metoprolol and diltiazem. He is on long-term anticoagulation with warfarin.  3. Coronary artery disease status post CABG, he had a mildly abnormal stress test with evidence of mild ischemia in the left circumflex distribution. However, he is currently not having anginal symptoms and thus I recommend continuing medical therapy.    Disposition:   FU with me in 4 months  Signed,  Lorine Bears, MD  10/18/2015 1:15 PM    [facility]

## 2015-10-30 ENCOUNTER — Encounter: Payer: Self-pay | Admitting: *Deleted

## 2015-10-30 ENCOUNTER — Encounter: Payer: Self-pay | Admitting: Internal Medicine

## 2015-10-30 ENCOUNTER — Other Ambulatory Visit: Payer: Self-pay

## 2015-10-30 ENCOUNTER — Ambulatory Visit: Payer: Self-pay | Admitting: Internal Medicine

## 2015-10-30 VITALS — BP 135/86 | HR 69 | Temp 97.8°F | Wt 142.0 lb

## 2015-10-30 DIAGNOSIS — E785 Hyperlipidemia, unspecified: Secondary | ICD-10-CM

## 2015-10-30 MED ORDER — CARBIDOPA-LEVODOPA 25-250 MG PO TABS: 1.0000 | ORAL_TABLET | Freq: Three times a day (TID) | ORAL | 2 refills | Status: DC

## 2015-10-30 MED ORDER — DILTIAZEM HCL ER COATED BEADS 120 MG PO CP24: 120.0000 mg | ORAL_CAPSULE | Freq: Every day | ORAL | 2 refills | Status: DC

## 2015-10-30 NOTE — Progress Notes (Signed)
   Subjective:    Patient ID: Adam Roberson, male    DOB: September 17, 1928, 82 y.o.   MRN: [id number]  HPI   Pt. Had recent cardiology f/u and was stable. Pt. Had recent cataract removal. Having other cataract removed next week Pt presents with pain in R neck, shoulder, & arm    Patient Active Problem List   Diagnosis Date Noted  . Hypertensive heart disease   . Hyperlipidemia   . CAD (coronary artery disease)   . Permanent atrial fibrillation ([place])   . Atrial fibrillation with rapid ventricular response ([place]) 07/03/2015  . Bilateral pneumonia   . Dyspnea   . Persistent atrial fibrillation ([place])   . Acute on chronic diastolic CHF (congestive heart failure) ([place])   . Sepsis ([place]) 06/28/2015  . Parkinson disease ([place]) 04/09/2015  . IHD (ischemic heart disease) 10/31/2014     Medication List       Accurate as of 10/30/15 11:58 AM. Always use your most recent med list.          atorvastatin 20 MG tablet Commonly known as:  LIPITOR Take 20 mg by mouth daily.   carbidopa-levodopa 25-250 MG tablet Commonly known as:  SINEMET IR Take 1 tablet by mouth 3 (three) times daily.   diltiazem 120 MG 24 hr capsule Commonly known as:  CARDIZEM CD Take 1 capsule (120 mg total) by mouth daily.   furosemide 20 MG tablet Commonly known as:  LASIX Take 1 tablet (20 mg total) by mouth every other day.   metoprolol 50 MG tablet Commonly known as:  LOPRESSOR Take 50 mg by mouth. Takes 1/2 tab BID   warfarin 5 MG tablet Commonly known as:  COUMADIN Take 1 tablet (5 mg total) by mouth daily. Takes 1/2 tab Monday and Friday Takes 1 tab T, W, Th, Sat, Sun        Review of Systems  Weight is stable No peripheral edema  R arm, shoulder, & neck. Pain likely due to eye surgery and arthritis     Objective:   Physical Exam  Constitutional: He is oriented to person, place, and time.  Cardiovascular: Normal rate, regular rhythm and normal heart sounds.   Pulmonary/Chest: Effort  normal and breath sounds normal.  Neurological: He is alert and oriented to person, place, and time.    BP 135/86   Pulse 69   Temp 97.8 F (36.6 C)   Wt 142 lb (64.4 kg)   BMI 25.97 kg/m        Assessment & Plan:   F/u in 3 months w/ labs: Met C, CBC, TSH, Lipid, UA Monthly protime for 1 year

## 2015-10-30 NOTE — Patient Instructions (Signed)
F/u in 3 months w/ fasting labs: Met C, CBC, TSH, Lipid, UA Monthly protime for 1 year

## 2015-11-05 ENCOUNTER — Encounter: Payer: Self-pay | Admitting: *Deleted

## 2015-11-05 ENCOUNTER — Ambulatory Visit
Admission: RE | Admit: 2015-11-05 | Discharge: 2015-11-05 | Disposition: A | Discharge status: Home / Self Care | Payer: PRIVATE HEALTH INSURANCE | Source: Ambulatory Visit | Attending: Ophthalmology | Admitting: Ophthalmology

## 2015-11-05 ENCOUNTER — Ambulatory Visit: Payer: PRIVATE HEALTH INSURANCE | Admitting: Anesthesiology

## 2015-11-05 ENCOUNTER — Encounter
Admission: RE | Disposition: A | Discharge status: Home / Self Care | Payer: Self-pay | Source: Ambulatory Visit | Attending: Ophthalmology

## 2015-11-05 DIAGNOSIS — Z7901 Long term (current) use of anticoagulants: Secondary | ICD-10-CM | POA: Diagnosis not present

## 2015-11-05 DIAGNOSIS — I5032 Chronic diastolic (congestive) heart failure: Secondary | ICD-10-CM | POA: Insufficient documentation

## 2015-11-05 DIAGNOSIS — I110 Hypertensive heart disease with heart failure: Secondary | ICD-10-CM | POA: Diagnosis not present

## 2015-11-05 DIAGNOSIS — I2510 Atherosclerotic heart disease of native coronary artery without angina pectoris: Secondary | ICD-10-CM | POA: Insufficient documentation

## 2015-11-05 DIAGNOSIS — Z87891 Personal history of nicotine dependence: Secondary | ICD-10-CM | POA: Insufficient documentation

## 2015-11-05 DIAGNOSIS — Z8701 Personal history of pneumonia (recurrent): Secondary | ICD-10-CM | POA: Diagnosis not present

## 2015-11-05 DIAGNOSIS — I739 Peripheral vascular disease, unspecified: Secondary | ICD-10-CM | POA: Diagnosis not present

## 2015-11-05 DIAGNOSIS — Z86718 Personal history of other venous thrombosis and embolism: Secondary | ICD-10-CM | POA: Diagnosis not present

## 2015-11-05 DIAGNOSIS — Z8673 Personal history of transient ischemic attack (TIA), and cerebral infarction without residual deficits: Secondary | ICD-10-CM | POA: Diagnosis not present

## 2015-11-05 DIAGNOSIS — H2511 Age-related nuclear cataract, right eye: Secondary | ICD-10-CM | POA: Insufficient documentation

## 2015-11-05 DIAGNOSIS — I482 Chronic atrial fibrillation: Secondary | ICD-10-CM | POA: Insufficient documentation

## 2015-11-05 DIAGNOSIS — G20 Parkinson's disease: Secondary | ICD-10-CM | POA: Insufficient documentation

## 2015-11-05 DIAGNOSIS — E785 Hyperlipidemia, unspecified: Secondary | ICD-10-CM | POA: Diagnosis not present

## 2015-11-05 DIAGNOSIS — Z79899 Other long term (current) drug therapy: Secondary | ICD-10-CM | POA: Insufficient documentation

## 2015-11-05 DIAGNOSIS — Z951 Presence of aortocoronary bypass graft: Secondary | ICD-10-CM | POA: Insufficient documentation

## 2015-11-05 HISTORY — PX: CATARACT EXTRACTION W/PHACO: SHX586

## 2015-11-05 SURGERY — PHACOEMULSIFICATION, CATARACT, WITH IOL INSERTION
Anesthesia: Monitor Anesthesia Care | Site: Eye | Laterality: Right | Wound class: Clean

## 2015-11-05 MED ORDER — NEOMYCIN-POLYMYXIN-DEXAMETH 3.5-10000-0.1 OP OINT
TOPICAL_OINTMENT | OPHTHALMIC | Status: AC
  Filled 2015-11-05: qty 3.5

## 2015-11-05 MED ORDER — NA HYALUR & NA CHOND-NA HYALUR 0.4-0.35 ML IO KIT
PACK | INTRAOCULAR | Status: DC | PRN
  Administered 2015-11-05: .35 mL via INTRAOCULAR

## 2015-11-05 MED ORDER — MOXIFLOXACIN HCL 0.5 % OP SOLN: 1.0000 [drp] | OPHTHALMIC | Status: DC | PRN

## 2015-11-05 MED ORDER — POVIDONE-IODINE 5 % OP SOLN
1.0000 "application " | Freq: Once | OPHTHALMIC | Status: AC
  Administered 2015-11-05: 1 via OPHTHALMIC

## 2015-11-05 MED ORDER — TETRACAINE HCL 0.5 % OP SOLN
OPHTHALMIC | Status: AC
  Filled 2015-11-05: qty 2

## 2015-11-05 MED ORDER — LIDOCAINE HCL (PF) 4 % IJ SOLN
INTRAMUSCULAR | Status: AC
  Filled 2015-11-05: qty 5

## 2015-11-05 MED ORDER — CEFUROXIME OPHTHALMIC INJECTION 1 MG/0.1 ML
INJECTION | OPHTHALMIC | Status: DC | PRN
  Administered 2015-11-05: 0.1 mL via INTRACAMERAL

## 2015-11-05 MED ORDER — ALFENTANIL 500 MCG/ML IJ INJ
INJECTION | INTRAMUSCULAR | Status: DC | PRN
  Administered 2015-11-05 (×2): 250 ug via INTRAVENOUS

## 2015-11-05 MED ORDER — ARMC OPHTHALMIC DILATING GEL
OPHTHALMIC | Status: AC
  Filled 2015-11-05: qty 0.25

## 2015-11-05 MED ORDER — MIDAZOLAM HCL 5 MG/5ML IJ SOLN
INTRAMUSCULAR | Status: DC | PRN
  Administered 2015-11-05: 1 mg via INTRAVENOUS

## 2015-11-05 MED ORDER — CARBACHOL 0.01 % IO SOLN
INTRAOCULAR | Status: DC | PRN
  Administered 2015-11-05: 0.5 mL via INTRAOCULAR

## 2015-11-05 MED ORDER — NA HYALUR & NA CHOND-NA HYALUR 0.55-0.5 ML IO KIT
PACK | INTRAOCULAR | Status: AC
  Filled 2015-11-05: qty 1.05

## 2015-11-05 MED ORDER — POVIDONE-IODINE 5 % OP SOLN
OPHTHALMIC | Status: DC
Start: 2015-11-05 — End: 2015-11-05
  Filled 2015-11-05: qty 30

## 2015-11-05 MED ORDER — NEOMYCIN-POLYMYXIN-DEXAMETH 0.1 % OP OINT
TOPICAL_OINTMENT | OPHTHALMIC | Status: DC | PRN
  Administered 2015-11-05: 1 via OPHTHALMIC

## 2015-11-05 MED ORDER — EPINEPHRINE HCL 1 MG/ML IJ SOLN
INTRAMUSCULAR | Status: DC | PRN
  Administered 2015-11-05: 1 mL via OPHTHALMIC

## 2015-11-05 MED ORDER — CEFUROXIME OPHTHALMIC INJECTION 1 MG/0.1 ML
INJECTION | OPHTHALMIC | Status: AC
  Filled 2015-11-05: qty 0.1

## 2015-11-05 MED ORDER — EPINEPHRINE HCL 1 MG/ML IJ SOLN
INTRAMUSCULAR | Status: AC
  Filled 2015-11-05: qty 1

## 2015-11-05 MED ORDER — LIDOCAINE HCL (PF) 4 % IJ SOLN
INTRAOCULAR | Status: DC | PRN
  Administered 2015-11-05: 4 mL via OPHTHALMIC

## 2015-11-05 MED ORDER — TETRACAINE HCL 0.5 % OP SOLN
1.0000 [drp] | Freq: Once | OPHTHALMIC | Status: AC
  Administered 2015-11-05: 1 [drp] via OPHTHALMIC

## 2015-11-05 MED ORDER — ARMC OPHTHALMIC DILATING GEL
1.0000 "application " | OPHTHALMIC | Status: AC | PRN
  Administered 2015-11-05 (×2): 1 via OPHTHALMIC

## 2015-11-05 MED ORDER — MOXIFLOXACIN HCL 0.5 % OP SOLN
OPHTHALMIC | Status: AC
  Filled 2015-11-05: qty 3

## 2015-11-05 MED ORDER — SODIUM CHLORIDE 0.9 % IV SOLN
INTRAVENOUS | Status: DC
  Administered 2015-11-05: 07:00:00 via INTRAVENOUS

## 2015-11-05 SURGICAL SUPPLY — 21 items
CANNULA ANT/CHMB 27GA (MISCELLANEOUS) ×3 IMPLANT
CUP MEDICINE 2OZ PLAST GRAD ST (MISCELLANEOUS) ×3 IMPLANT
GLOVE BIO SURGEON STRL SZ8 (GLOVE) ×3 IMPLANT
GLOVE BIOGEL M 6.5 STRL (GLOVE) ×3 IMPLANT
GLOVE SURG LX 7.5 STRW (GLOVE) ×2
GLOVE SURG LX STRL 7.5 STRW (GLOVE) ×1 IMPLANT
GOWN STRL REUS W/ TWL LRG LVL3 (GOWN DISPOSABLE) ×2 IMPLANT
GOWN STRL REUS W/TWL LRG LVL3 (GOWN DISPOSABLE) ×4
LENS IOL TECNIS ITEC 24.5 (Intraocular Lens) ×3 IMPLANT
PACK CATARACT (MISCELLANEOUS) ×3 IMPLANT
PACK CATARACT BRASINGTON LX (MISCELLANEOUS) ×3 IMPLANT
PACK EYE AFTER SURG (MISCELLANEOUS) ×3 IMPLANT
SOL BSS BAG (MISCELLANEOUS) ×3
SOL PREP PVP 2OZ (MISCELLANEOUS) ×3
SOLUTION BSS BAG (MISCELLANEOUS) ×1 IMPLANT
SOLUTION PREP PVP 2OZ (MISCELLANEOUS) ×1 IMPLANT
SYR 3ML LL SCALE MARK (SYRINGE) ×3 IMPLANT
SYR 5ML LL (SYRINGE) ×3 IMPLANT
SYR TB 1ML 27GX1/2 LL (SYRINGE) ×3 IMPLANT
WATER STERILE IRR 1000ML POUR (IV SOLUTION) ×3 IMPLANT
WIPE NON LINTING 3.25X3.25 (MISCELLANEOUS) ×3 IMPLANT

## 2015-11-05 NOTE — Op Note (Signed)
LOCATION:  [facility]   PREOPERATIVE DIAGNOSIS:    Nuclear sclerotic cataract right eye. H25.11   POSTOPERATIVE DIAGNOSIS:  Nuclear sclerotic cataract right eye.     PROCEDURE:  Phacoemusification with posterior chamber intraocular lens placement of the right eye   LENS:   Implant Name Type Inv. Item Serial No. Manufacturer Lot No. LRB No. Used  LENS IOL DIOP 24.5 - [id number] Intraocular Lens LENS IOL DIOP 24.5 [id number]   Right 1        ULTRASOUND TIME: 14 % of 1 minutes, 6 seconds.  CDE 9.5   SURGEON:  Deirdre Evenerhadwick R. Tascha Casares, MD   ANESTHESIA:  Topical with tetracaine drops and 2% Xylocaine jelly, augmented with 1% preservative-free intracameral lidocaine.    COMPLICATIONS:  None.   DESCRIPTION OF PROCEDURE:  The patient was identified in the holding room and transported to the operating room and placed in the supine position under the operating microscope.  The right eye was identified as the operative eye and it was prepped and draped in the usual sterile ophthalmic fashion.   A 1 millimeter clear-corneal paracentesis was made at the 12:00 position.  0.5 ml of preservative-free 1% lidocaine was injected into the anterior chamber. The anterior chamber was filled with Viscoat viscoelastic.  A 2.4 millimeter keratome was used to make a near-clear corneal incision at the 9:00 position.  A curvilinear capsulorrhexis was made with a cystotome and capsulorrhexis forceps.  Balanced salt solution was used to hydrodissect and hydrodelineate the nucleus.   Phacoemulsification was then used in stop and chop fashion to remove the lens nucleus and epinucleus.  The remaining cortex was then removed using the irrigation and aspiration handpiece. Provisc was then placed into the capsular bag to distend it for lens placement.  A lens was then injected into the capsular bag.  The remaining viscoelastic was aspirated.   Wounds were hydrated with balanced salt solution.  The anterior  chamber was inflated to a physiologic pressure with balanced salt solution.  No wound leaks were noted. Cefuroxime 0.1 ml of a 10mg /ml solution was injected into the anterior chamber for a dose of 1 mg of intracameral antibiotic at the completion of the case.  Maxitrol ointment was applied to the eye and it was shielded.   The patient ws taken to the PACU.  Gemini Bunte 11/05/2015, 8:25 AM

## 2015-11-05 NOTE — Anesthesia Postprocedure Evaluation (Signed)
Anesthesia Post Note  Patient: Adam Roberson  Procedure(s) Performed: Procedure(s) (LRB): CATARACT EXTRACTION PHACO AND INTRAOCULAR LENS PLACEMENT (IOC) (Right)  Patient location during evaluation: PACU Anesthesia Type: MAC Level of consciousness: awake and alert, oriented and patient cooperative Pain management: satisfactory to patient Vital Signs Assessment: post-procedure vital signs reviewed and stable Respiratory status: spontaneous breathing Cardiovascular status: stable Anesthetic complications: no    Last Vitals:  Vitals:   11/05/15 0619 11/05/15 0800  BP: (!) 154/85 116/77  Pulse: 73 64  Resp: 18 16  Temp: 37.4 C 36.9 C    Last Pain:  Vitals:   11/05/15 0800  TempSrc: Tympanic                 Michaele OfferSavage,  Devyn Sheerin A

## 2015-11-05 NOTE — Transfer of Care (Signed)
Immediate Anesthesia Transfer of Care Note  Patient: Adam Roberson  Procedure(s) Performed: Procedure(s) with comments: CATARACT EXTRACTION PHACO AND INTRAOCULAR LENS PLACEMENT (IOC) (Right) - US 01:06 AP% 14.3 CDE 9.46 Fluid Pack lot # [id number] H  Patient Location: PACU  Anesthesia Type:MAC  Level of Consciousness: awake, alert , oriented and patient cooperative  Airway & Oxygen Therapy: Patient Spontanous Breathing  Post-op Assessment: Report given to RN, Post -op Vital signs reviewed and stable and Patient moving all extremities X 4  Post vital signs: Reviewed and stable  Last Vitals:  Vitals:   11/05/15 0619 11/05/15 0800  BP: (!) 154/85 116/77  Pulse: 73 64  Resp: 18 16  Temp: 37.4 C 36.9 C    Last Pain:  Vitals:   11/05/15 0800  TempSrc: Tympanic         Complications: No apparent anesthesia complications

## 2015-11-05 NOTE — H&P (Signed)
  The History and Physical notes are on paper, have been signed, and are to be scanned. The patient remains stable and unchanged from the H&P.   Previous H&P reviewed, patient examined, and there are no changes.  Adam Roberson 11/05/2015 7:33 AM

## 2015-11-05 NOTE — Anesthesia Preprocedure Evaluation (Addendum)
Anesthesia Evaluation  Patient identified by MRN, date of birth, ID band Patient awake    Reviewed: Allergy & Precautions, NPO status , Patient's Chart, lab work & pertinent test results, reviewed documented beta blocker date and time   History of Anesthesia Complications Negative for: history of anesthetic complications  Airway Mallampati: III  TM Distance: <3 FB Neck ROM: limited    Dental  (+) Missing, Poor Dentition   Pulmonary shortness of breath, pneumonia, resolved, former smoker,           Cardiovascular hypertension, Pt. on medications and Pt. on home beta blockers + CAD, + Peripheral Vascular Disease and +CHF  + dysrhythmias Atrial Fibrillation + pacemaker  Rhythm:regular Rate:Normal     Neuro/Psych CVA, Residual Symptoms    GI/Hepatic   Endo/Other    Renal/GU      Musculoskeletal   Abdominal   Peds  Hematology   Anesthesia Other Findings Parkinson's. CABG in 2003. Echo OK. Has some R sided weakness.  Past Medical History: No date: CAD (coronary artery disease)     Comment: a.2003 s/p CABG x3 in [place]; b. ? 2014 PCI               (records not available). No date: Chronic diastolic CHF (congestive heart failur*     Comment: a. 06/2015 Echo: EF 60-65%, mod dil LA, nl RV,               mild to mod TR, PASP 46mmHg. No date: DVT (deep venous thrombosis) (HCC) No date: DVT of leg (deep venous thrombosis) (HCC) No date: Dysrhythmia No date: H/O: CVA (cerebrovascular accident) No date: HOH (hard of hearing) No date: Hyperlipidemia No date: Hypertension No date: Hypertensive heart disease No date: Parkinson disease (HCC) No date: Permanent atrial fibrillation (HCC)     Comment: a. CHA2DS2VASc= 7-->coumadin (followed by               primary care). No date: Pneumonia     Comment: 4/17 No date: Stroke [facility](HCC)   Reproductive/Obstetrics                             Anesthesia  Physical  Anesthesia Plan  ASA: III  Anesthesia Plan: MAC   Post-op Pain Management:    Induction:   Airway Management Planned:   Additional Equipment:   Intra-op Plan:   Post-operative Plan:   Informed Consent: I have reviewed the patients History and Physical, chart, labs and discussed the procedure including the risks, benefits and alternatives for the proposed anesthesia with the patient or authorized representative who has indicated his/her understanding and acceptance.     Plan Discussed with: CRNA  Anesthesia Plan Comments:         Anesthesia Quick Evaluation

## 2015-11-05 NOTE — Discharge Instructions (Signed)
Eye Surgery Discharge Instructions   SEE HANDOUT     Expect mild scratchy sensation or mild soreness. DO NOT RUB YOUR EYE!  The day of surgery:  Minimal physical activity, but bed rest is not required  No reading, computer work, or close hand work  No bending, lifting, or straining.  May watch TV  For 24 hours:  No driving, legal decisions, or alcoholic beverages  Safety precautions  Eat anything you prefer: It is better to start with liquids, then soup then solid foods.  _____ Eye patch should be worn until postoperative exam tomorrow.  ____ Solar shield eyeglasses should be worn for comfort in the sunlight/patch while sleeping  Resume all regular medications including aspirin or Coumadin if these were discontinued prior to surgery. You may shower, bathe, shave, or wash your hair. Tylenol may be taken for mild discomfort.  Call your doctor if you experience significant pain, nausea, or vomiting, fever > 101 or other signs of infection. [phone] or [phone] Specific instructions:

## 2015-11-06 ENCOUNTER — Encounter: Payer: Self-pay | Admitting: Ophthalmology

## 2015-11-06 ENCOUNTER — Ambulatory Visit: Payer: Self-pay | Admitting: Internal Medicine

## 2015-11-13 ENCOUNTER — Other Ambulatory Visit: Payer: Self-pay

## 2015-11-20 ENCOUNTER — Other Ambulatory Visit: Payer: Self-pay

## 2015-12-12 ENCOUNTER — Other Ambulatory Visit: Payer: Self-pay | Admitting: Internal Medicine

## 2016-01-13 ENCOUNTER — Other Ambulatory Visit: Payer: Self-pay | Admitting: Internal Medicine

## 2016-01-22 ENCOUNTER — Other Ambulatory Visit: Payer: Self-pay

## 2016-01-29 ENCOUNTER — Ambulatory Visit: Payer: Self-pay | Admitting: Internal Medicine

## 2016-02-14 ENCOUNTER — Other Ambulatory Visit: Payer: Self-pay | Admitting: Urology

## 2016-02-17 ENCOUNTER — Telehealth: Payer: Self-pay | Admitting: Urology

## 2016-02-17 ENCOUNTER — Ambulatory Visit: Payer: Self-pay | Admitting: Cardiovascular Disease

## 2016-02-17 ENCOUNTER — Encounter: Payer: Self-pay | Admitting: *Deleted

## 2016-02-17 DIAGNOSIS — Z0189 Encounter for other specified special examinations: Secondary | ICD-10-CM

## 2016-02-17 NOTE — Progress Notes (Deleted)
Cardiology Office Note   Date:  02/17/2016   ID:  Adam Roberson, DOB February 08, 1929, MRN [id number]  [facility]  Cardiologist:   Lorine BearsMuhammad Dewane Timson, MD   No chief complaint on file.     History of Present Illness: Adam Roberson is a 82 y.o. male who presents for a follow-up visit regarding coronary artery disease, chronic diastolic heart failure and atrial fibrillation. Remote history of CABG in [place]. History of permanent atrial fibrillation. He was hospitalized in April with pneumonia and was found to have atrial fibrillation with rapid ventricular response. Echocardiogram showed normal LV systolic function. Rate control was achieved with metoprolol and diltiazem. He complained of atypical chest pain. He underwent a nuclear stress test in June which showed normal LV systolic function. Perfusion showed possible basal inferolateral and anterolateral ischemia. He underwent cataract surgery without complications. He is doing reasonably well and denies any chest pain. No palpitations. He continues to be weak.    Past Medical History:  Diagnosis Date  . CAD (coronary artery disease)    a.2003 s/p CABG x3 in [place]; b. ? 2014 PCI (records not available).  . Chronic diastolic CHF (congestive heart failure) (HCC)    a. 06/2015 Echo: EF 60-65%, mod dil LA, nl RV, mild to mod TR, PASP 46mmHg.  Marland Kitchen. DVT (deep venous thrombosis) (HCC)   . DVT of leg (deep venous thrombosis) (HCC)   . Dysrhythmia   . H/O: CVA (cerebrovascular accident)   . HOH (hard of hearing)   . Hyperlipidemia   . Hypertension   . Hypertensive heart disease   . Parkinson disease (HCC)   . Permanent atrial fibrillation (HCC)    a. CHA2DS2VASc= 7-->coumadin (followed by primary care).  . Pneumonia    4/17  . Stroke [facility](HCC)     Past Surgical History:  Procedure Laterality Date  . CARDIAC SURGERY  age 82   bypass  . CATARACT EXTRACTION W/PHACO Left 08/27/2015   Procedure: CATARACT EXTRACTION PHACO  AND INTRAOCULAR LENS PLACEMENT (IOC);  Surgeon: Lockie Molahadwick Brasington, MD;  Location: [facility] ORS;  Service: Ophthalmology;  Laterality: Left;  US 00:56 AP% 11.6 CDE 6.45 fluid pack lot # [id number]  . CATARACT EXTRACTION W/PHACO Right 11/05/2015   Procedure: CATARACT EXTRACTION PHACO AND INTRAOCULAR LENS PLACEMENT (IOC);  Surgeon: Lockie Molahadwick Brasington, MD;  Location: [facility] ORS;  Service: Ophthalmology;  Laterality: Right;  US 01:06 AP% 14.3 CDE 9.46 Fluid Pack lot # [id number]  . CORONARY ARTERY BYPASS GRAFT    . dvt    . EYE SURGERY       Current Outpatient Prescriptions  Medication Sig Dispense Refill  . atorvastatin (LIPITOR) 20 MG tablet Take 20 mg by mouth daily.    . carbidopa-levodopa (SINEMET IR) 25-250 MG tablet Take 1 tablet by mouth 3 (three) times daily. 270 tablet 2  . COUMADIN 5 MG tablet Take as directed. TAKES 1/2 TABLET ON MONDAY AND FRIDAY. TAKES 1 TAB ON TUESDAY, WEDNESDAY, THURSDAY, SATURDAY AND SUNDAY 90 tablet 0  . diltiazem (CARDIZEM CD) 120 MG 24 hr capsule Take 1 capsule (120 mg total) by mouth daily. 90 capsule 2  . furosemide (LASIX) 20 MG tablet Take 1 tablet (20 mg total) by mouth every other day. (Patient taking differently: Take 20 mg by mouth as needed. ) 30 tablet 0  . metoprolol (LOPRESSOR) 50 MG tablet Take 50 mg by mouth. Takes 1/2 tab BID     No current facility-administered medications for this visit.  Allergies:   Patient has no known allergies.    Social History:  The patient  reports that he has quit smoking. He has never used smokeless tobacco. He reports that he does not drink alcohol or use drugs.   Family History:  The patient's family history includes CAD in his mother.    ROS:  Please see the history of present illness.   Otherwise, review of systems are positive for none.   All other systems are reviewed and negative.    PHYSICAL EXAM: VS:  There were no vitals taken for this visit. , BMI There is no height or weight on file to calculate  BMI. GEN: Well nourished, well developed, in no acute distress  HEENT: normal  Neck: no JVD, carotid bruits, or masses Cardiac: Irregularly irregular; no murmurs, rubs, or gallops,no edema  Respiratory:  clear to auscultation bilaterally, normal work of breathing GI: soft, nontender, nondistended, + BS MS: no deformity or atrophy  Skin: warm and dry, no rash Neuro:  Strength and sensation are intact Psych: euthymic mood, full affect   EKG:  EKG is ordered today. The ekg ordered today demonstrates atrial fibrillation with possible old anterior infarct.   Recent Labs: 04/09/2015: TSH 0.85 06/29/2015: B Natriuretic Peptide 154.0 07/03/2015: Hemoglobin 15.1 08/08/2015: ALT 21; BUN 15; Creatinine, Ser 0.95; Platelets 214; Potassium 4.1; Sodium 138    Lipid Panel    Component Value Date/Time   CHOL 180 07/24/2015 1138   TRIG 114 07/24/2015 1138   HDL 49 07/24/2015 1138   CHOLHDL 3.7 07/24/2015 1138   LDLCALC 108 (H) 07/24/2015 1138      Wt Readings from Last 3 Encounters:  10/30/15 142 lb (64.4 kg)  10/18/15 142 lb (64.4 kg)  08/27/15 154 lb (69.9 kg)        ASSESSMENT AND PLAN:  1.  Chronic diastolic heart failure: The patient appears to be euvolemic and he is using furosemide only as needed.  2. Chronic atrial fibrillation: Ventricular rate is controlled on metoprolol and diltiazem. He is on long-term anticoagulation with warfarin.  3. Coronary artery disease status post CABG, he had a mildly abnormal stress test with evidence of mild ischemia in the left circumflex distribution. However, he is currently not having anginal symptoms and thus I recommend continuing medical therapy.    Disposition:   FU with me in 4 months  Signed,  Lorine BearsMuhammad Ellisyn Icenhower, MD  02/17/2016 9:40 AM    [facility]

## 2016-02-17 NOTE — Telephone Encounter (Signed)
Patient is requesting a refill on his Coumadin, but he has not had coag studies since August.  He needs to have these completed this week.  We can refill the medication for one week.

## 2016-02-19 ENCOUNTER — Telehealth: Payer: Self-pay

## 2016-02-19 MED ORDER — WARFARIN SODIUM 5 MG PO TABS: ORAL_TABLET | ORAL | 0 refills | Status: DC

## 2016-02-19 NOTE — Telephone Encounter (Signed)
Refilled for one week. Will call and get pt a appt for inr check asap.

## 2016-02-19 NOTE — Telephone Encounter (Signed)
Called pt to get appt for labs. Left message.

## 2016-02-27 NOTE — Telephone Encounter (Signed)
Called pt and daughter answered she stated he is on vacation for the next few months. I told her to have him call us when he gets back to schedule appt.

## 2016-05-06 IMAGING — DX DG CHEST 1V
1 series · 2 of 2 positions shown · non-contrast
Comparison: Chest x-ray 06/28/2015.

CLINICAL DATA: 87-year-old male with history of fever and weakness.

EXAM:
CHEST 1 VIEW

[Series 1: chest ap · 0.14mm/px · 2 of 2 slices shown]
[im 1/2]
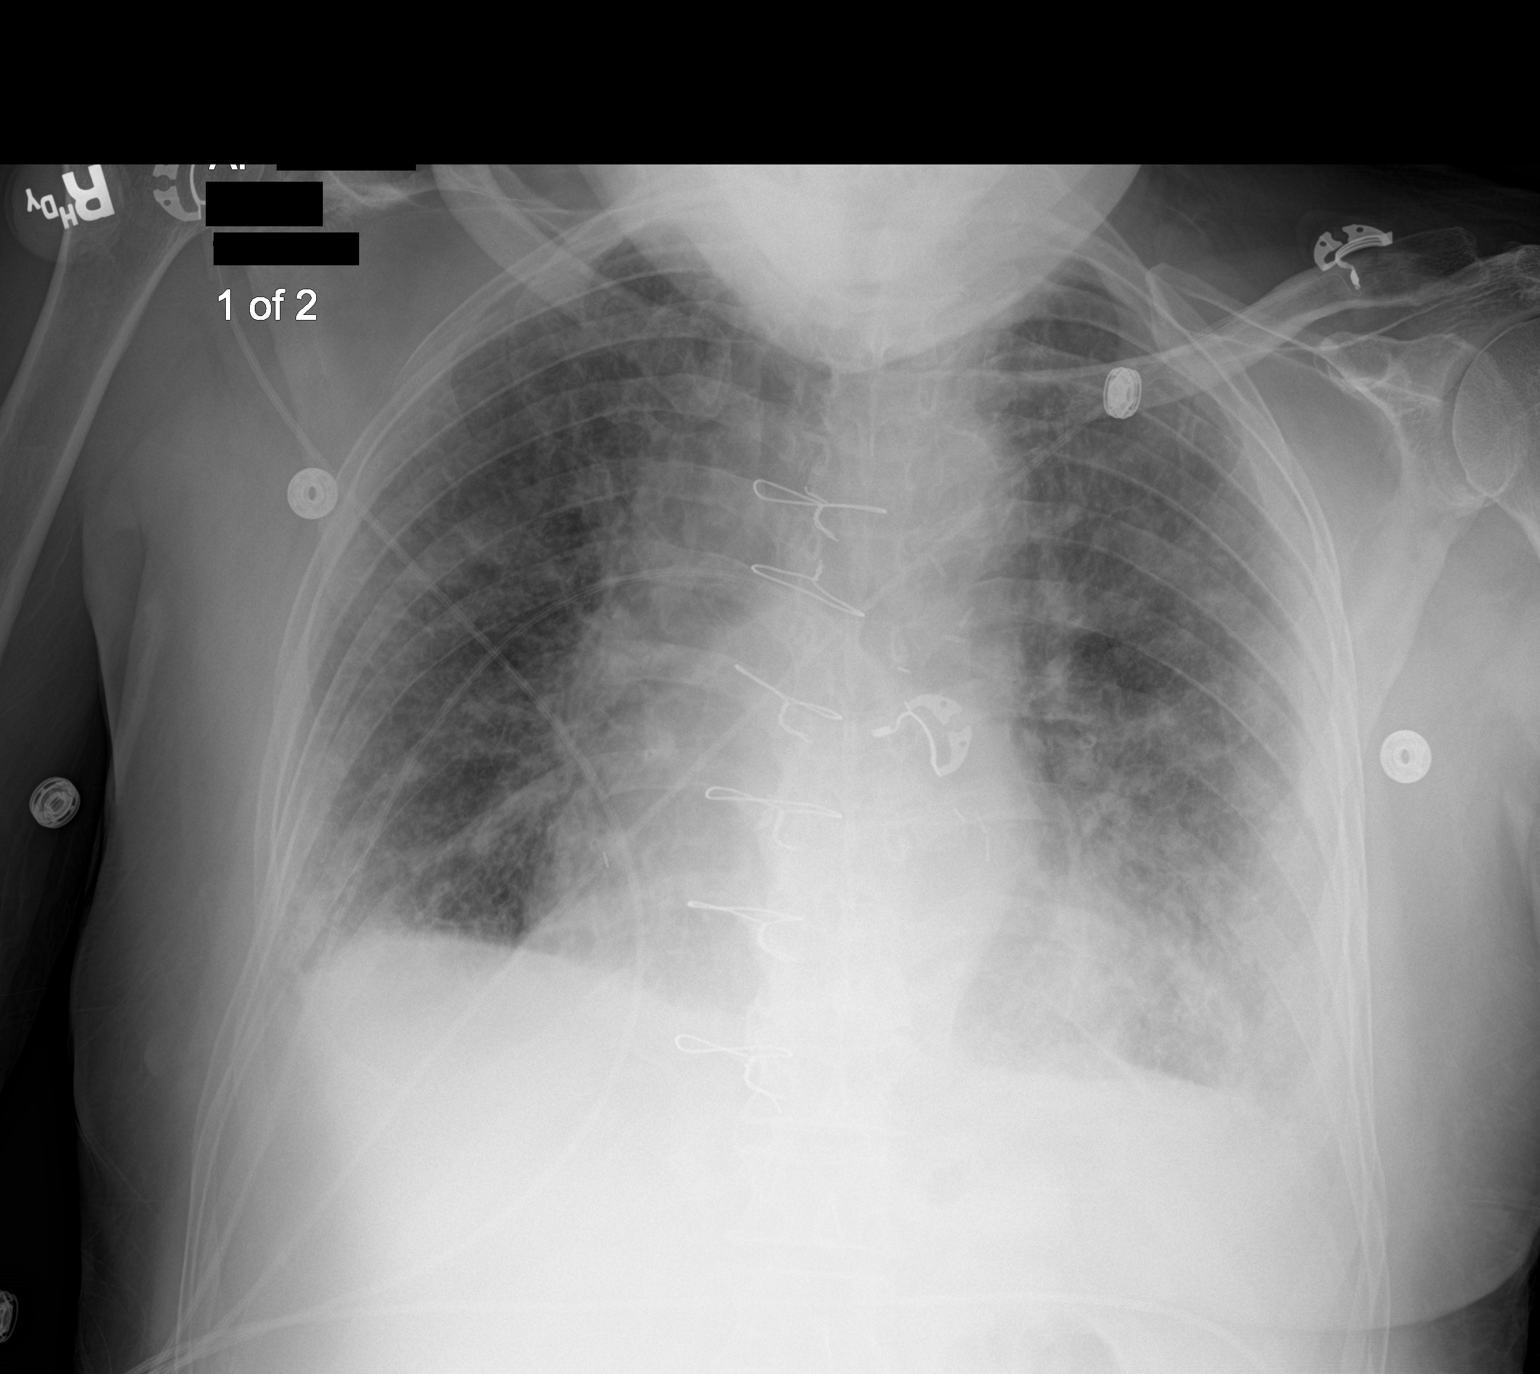
[im 2/2]
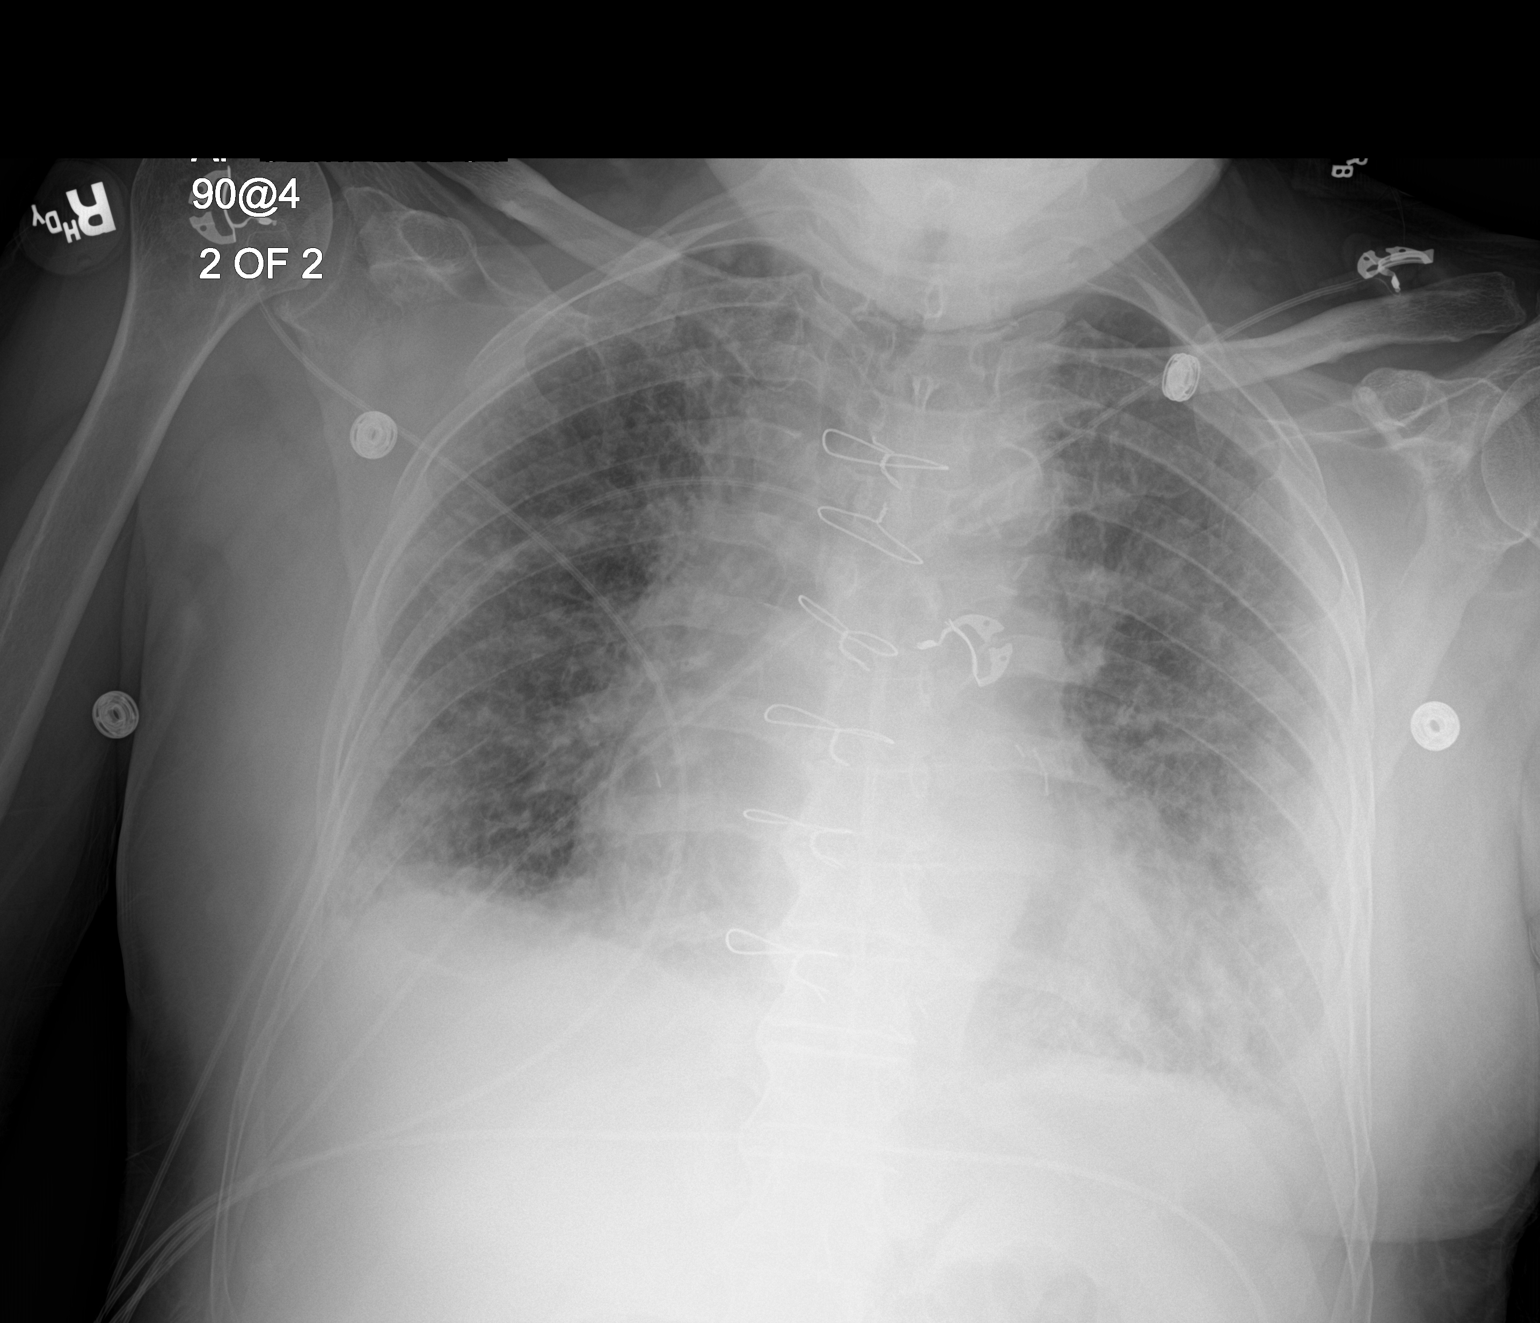

[2 of 2 positions shown; findings below may reference images not displayed]

FINDINGS: Lung volumes are low. Diffuse interstitial prominence and patchy
airspace opacities are noted throughout the lungs bilaterally, very
similar to the prior examination. Small bilateral pleural effusions.
Pulmonary vasculature does not appear engorged. Heart size is
borderline enlarged, likely accentuated by low lung volumes and
patient's rotation to the right which also distorts upper
mediastinal contours. Atherosclerosis in the thoracic aorta. Status
post median sternotomy.
IMPRESSION: 1. Widespread interstitial and patchy airspace disease in the lungs
bilaterally remains concerning for severe multilobar
bronchopneumonia.
2. Atherosclerosis.

## 2016-05-07 IMAGING — DX DG CHEST 1V
1 series · 1 of 1 positions shown · non-contrast
Comparison: Chest x-ray 06/29/2015.

CLINICAL DATA: 87-year-old male with history of dyspnea.

EXAM:
CHEST 1 VIEW

[chest ap]
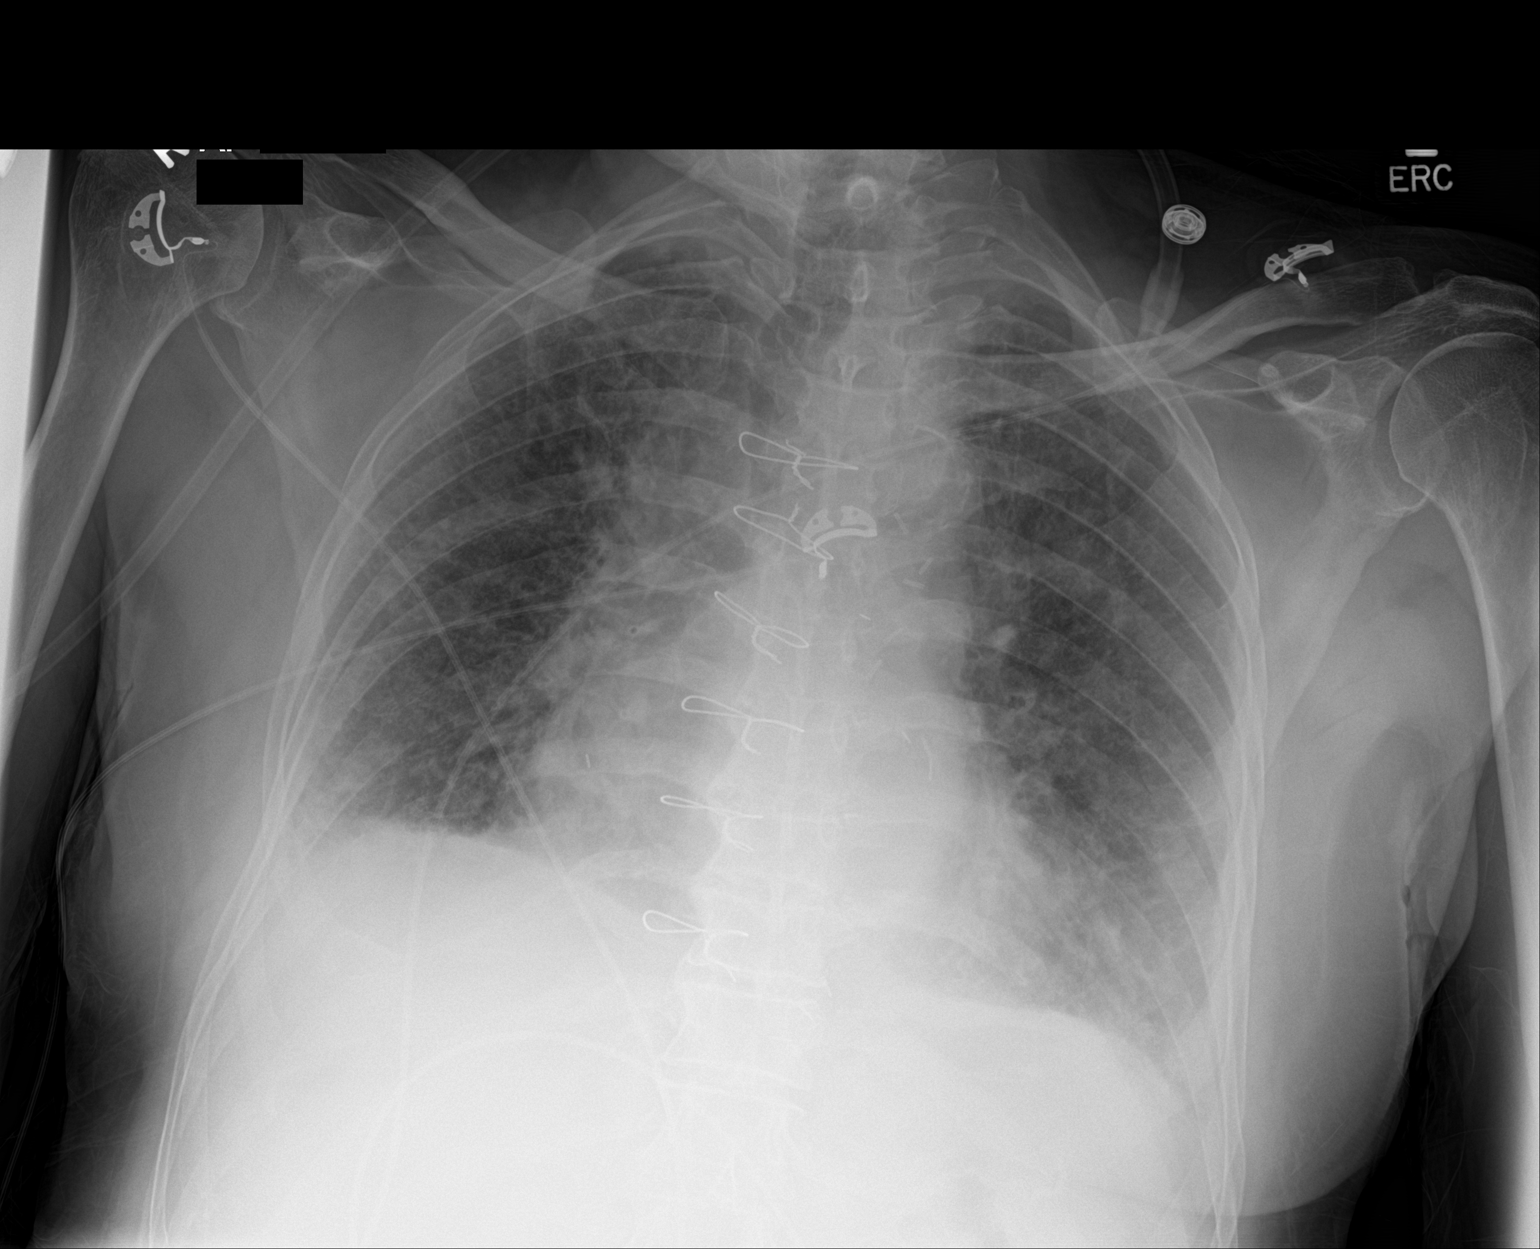

[1 of 1 positions shown; findings below may reference images not displayed]

FINDINGS: Status post median sternotomy for CABG. Lung volumes are low.
Diffuse peribronchial cuffing. Diffuse interstitial prominence and
patchy airspace disease throughout the lungs bilaterally, overall
very similar to the prior examination. Small bilateral pleural
effusions appear unchanged. Pulmonary vasculature does not appear
engorged. Heart size is upper limits of normal. Upper mediastinal
contours are distorted by patient's rotation to the right.
Atherosclerosis in the thoracic aorta.
IMPRESSION: 1. The appearance of the chest remains most concerning for
multilobar bronchopneumonia.
2. Small bilateral pleural effusions are unchanged.
3. Atherosclerosis.

## 2016-08-06 ENCOUNTER — Ambulatory Visit: Payer: Self-pay | Admitting: Family Medicine

## 2016-08-06 VITALS — BP 111/68 | HR 76

## 2016-08-06 DIAGNOSIS — I4821 Permanent atrial fibrillation: Secondary | ICD-10-CM

## 2016-08-06 DIAGNOSIS — I5033 Acute on chronic diastolic (congestive) heart failure: Secondary | ICD-10-CM

## 2016-08-06 DIAGNOSIS — G20 Parkinson's disease: Secondary | ICD-10-CM

## 2016-08-06 DIAGNOSIS — I4891 Unspecified atrial fibrillation: Secondary | ICD-10-CM

## 2016-08-06 DIAGNOSIS — E782 Mixed hyperlipidemia: Secondary | ICD-10-CM

## 2016-08-06 DIAGNOSIS — J189 Pneumonia, unspecified organism: Secondary | ICD-10-CM

## 2016-08-06 DIAGNOSIS — I2510 Atherosclerotic heart disease of native coronary artery without angina pectoris: Secondary | ICD-10-CM

## 2016-08-06 DIAGNOSIS — Z0189 Encounter for other specified special examinations: Secondary | ICD-10-CM

## 2016-08-06 DIAGNOSIS — A419 Sepsis, unspecified organism: Secondary | ICD-10-CM

## 2016-08-06 LAB — POCT INR: INR: 2.7

## 2016-08-06 MED ORDER — DILTIAZEM HCL ER COATED BEADS 120 MG PO CP24: 120.0000 mg | ORAL_CAPSULE | Freq: Every day | ORAL | 2 refills | Status: DC

## 2016-08-06 MED ORDER — TRAZODONE HCL 50 MG PO TABS: 50.0000 mg | ORAL_TABLET | Freq: Every day | ORAL | 5 refills | Status: DC

## 2016-08-06 MED ORDER — RISPERIDONE 1 MG PO TABS: 1.0000 mg | ORAL_TABLET | Freq: Two times a day (BID) | ORAL | 5 refills | Status: DC

## 2016-08-06 MED ORDER — WARFARIN SODIUM 5 MG PO TABS: ORAL_TABLET | ORAL | 0 refills | Status: DC

## 2016-08-06 MED ORDER — ATORVASTATIN CALCIUM 20 MG PO TABS: 20.0000 mg | ORAL_TABLET | Freq: Every day | ORAL | 5 refills | Status: DC

## 2016-08-06 MED ORDER — FUROSEMIDE 20 MG PO TABS: 20.0000 mg | ORAL_TABLET | ORAL | 5 refills | Status: DC | PRN

## 2016-08-06 MED ORDER — CARBIDOPA-LEVODOPA 25-250 MG PO TABS: 1.0000 | ORAL_TABLET | Freq: Three times a day (TID) | ORAL | 2 refills | Status: DC

## 2016-08-06 MED ORDER — METOPROLOL TARTRATE 50 MG PO TABS: 50.0000 mg | ORAL_TABLET | Freq: Two times a day (BID) | ORAL | 5 refills | Status: DC

## 2016-08-06 NOTE — Progress Notes (Signed)
   Subjective:    Patient ID: Adam Roberson, male    DOB: [birth year], 83 y.o.   MRN: [id number]  Hyperlipidemia    83 yo [place] gentleman with h/o CVA/Parkinsons Disease/Alzheimers/AFib/CAD/Chronic CHF   Review of Systems  Constitutional: Negative.   Eyes: Negative.   Respiratory: Negative.   Cardiovascular: Negative.   Genitourinary: Positive for enuresis.  Musculoskeletal: Positive for arthralgias.  Allergic/Immunologic: Negative.   Neurological: Positive for tremors.  Psychiatric/Behavioral: Negative.    Needs rf on meds and needs labs. C/o right arm and right neck pain--chronic.    Objective:   Physical Exam  Constitutional: He appears well-developed and well-nourished.  HENT:  Head: Normocephalic and atraumatic.  Right Ear: External ear normal.  Left Ear: External ear normal.  Nose: Nose normal.  Cardiovascular: Normal rate, regular rhythm and normal heart sounds.   Pulmonary/Chest: Effort normal and breath sounds normal.  Abdominal: Soft.  Musculoskeletal: He exhibits no deformity.  Neurological: He is alert.  Skin: Skin is warm and dry.  Psychiatric: He has a normal mood and affect.          Assessment & Plan:  CAD CHF AFib Parkinsons Disease Check labs  Refer to Neurology at family request.

## 2016-08-06 NOTE — Progress Notes (Signed)
Dr. Sullivan LoneGilbert reviewed continue same regimen for Coumadin and return in 4 weeks for a recheck.

## 2016-08-07 ENCOUNTER — Telehealth: Payer: Self-pay

## 2016-08-07 DIAGNOSIS — Z0000 Encounter for general adult medical examination without abnormal findings: Secondary | ICD-10-CM

## 2016-08-07 LAB — CBC WITH DIFFERENTIAL/PLATELET
BASOS: 0 %
Basophils Absolute: 0 10*3/uL (ref 0.0–0.2)
EOS (ABSOLUTE): 0.1 10*3/uL (ref 0.0–0.4)
EOS: 2 %
HEMATOCRIT: 44.3 % (ref 37.5–51.0)
HEMOGLOBIN: 15.2 g/dL (ref 13.0–17.7)
Immature Grans (Abs): 0 10*3/uL (ref 0.0–0.1)
Immature Granulocytes: 0 %
LYMPHS ABS: 1.1 10*3/uL (ref 0.7–3.1)
Lymphs: 17 %
MCH: 30.7 pg (ref 26.6–33.0)
MCHC: 34.3 g/dL (ref 31.5–35.7)
MCV: 90 fL (ref 79–97)
MONOCYTES: 11 %
Monocytes Absolute: 0.7 10*3/uL (ref 0.1–0.9)
NEUTROS ABS: 4.5 10*3/uL (ref 1.4–7.0)
Neutrophils: 70 %
Platelets: 158 10*3/uL (ref 150–379)
RBC: 4.95 x10E6/uL (ref 4.14–5.80)
RDW: 14 % (ref 12.3–15.4)
WBC: 6.4 10*3/uL (ref 3.4–10.8)

## 2016-08-07 LAB — LIPID PANEL
CHOL/HDL RATIO: 2.9 ratio (ref 0.0–5.0)
Cholesterol, Total: 123 mg/dL (ref 100–199)
HDL: 43 mg/dL (ref >39)
LDL CALC: 62 mg/dL (ref 0–99)
TRIGLYCERIDES: 88 mg/dL (ref 0–149)
VLDL Cholesterol Cal: 18 mg/dL (ref 5–40)

## 2016-08-07 LAB — TSH: TSH: 1.18 u[IU]/mL (ref 0.450–4.500)

## 2016-08-07 NOTE — Telephone Encounter (Signed)
Referral sent to neurology.

## 2016-08-12 ENCOUNTER — Other Ambulatory Visit: Payer: Self-pay | Admitting: Internal Medicine

## 2016-09-04 ENCOUNTER — Other Ambulatory Visit: Payer: Self-pay | Admitting: Internal Medicine

## 2016-09-08 ENCOUNTER — Other Ambulatory Visit: Payer: Self-pay | Admitting: Internal Medicine

## 2016-09-10 ENCOUNTER — Telehealth: Payer: Self-pay | Admitting: Pharmacy Technician

## 2016-09-10 ENCOUNTER — Other Ambulatory Visit: Payer: Self-pay

## 2016-09-10 DIAGNOSIS — I509 Heart failure, unspecified: Secondary | ICD-10-CM

## 2016-09-10 NOTE — Progress Notes (Signed)
inr

## 2016-09-10 NOTE — Telephone Encounter (Signed)
Patient eligible to receive medication assistance at [facility] until 04/16/17 as long as eligibility requirements continue to be met.  [name] [name] J. [name] [name] Care Manager [facility] 

## 2016-09-11 ENCOUNTER — Telehealth: Payer: Self-pay

## 2016-09-11 LAB — PROTIME-INR
INR: 2.6 — ABNORMAL HIGH (ref 0.8–1.2)
Prothrombin Time: 26.1 s — ABNORMAL HIGH (ref 9.1–12.0)

## 2016-09-11 NOTE — Progress Notes (Signed)
SW Adam Roberson keep therapy the same and recheck in 1 month.

## 2016-09-11 NOTE — Telephone Encounter (Signed)
LVM to schedule a PT/INR 1 month.  Lab work was normal continue same therapy.

## 2016-09-29 ENCOUNTER — Ambulatory Visit: Payer: Self-pay | Admitting: Neurology

## 2016-11-10 ENCOUNTER — Other Ambulatory Visit: Payer: Self-pay | Admitting: Internal Medicine

## 2016-11-10 DIAGNOSIS — Z0189 Encounter for other specified special examinations: Secondary | ICD-10-CM

## 2016-12-04 ENCOUNTER — Other Ambulatory Visit: Payer: Self-pay | Admitting: Internal Medicine

## 2016-12-04 DIAGNOSIS — Z0189 Encounter for other specified special examinations: Secondary | ICD-10-CM

## 2016-12-07 ENCOUNTER — Other Ambulatory Visit: Payer: Self-pay | Admitting: Internal Medicine

## 2016-12-15 ENCOUNTER — Telehealth: Payer: Self-pay | Admitting: Adult Health Nurse Practitioner

## 2016-12-15 NOTE — Telephone Encounter (Signed)
Wanted an appointment, but already has one. Needs to be called and told appointment date and time

## 2016-12-17 ENCOUNTER — Other Ambulatory Visit: Payer: Self-pay

## 2016-12-17 DIAGNOSIS — I509 Heart failure, unspecified: Secondary | ICD-10-CM

## 2016-12-17 LAB — POCT INR: INR: 2.3

## 2016-12-29 ENCOUNTER — Other Ambulatory Visit: Payer: Self-pay | Admitting: Adult Health Nurse Practitioner

## 2016-12-29 DIAGNOSIS — Z0189 Encounter for other specified special examinations: Secondary | ICD-10-CM

## 2017-01-15 ENCOUNTER — Other Ambulatory Visit: Payer: Self-pay | Admitting: Adult Health Nurse Practitioner

## 2017-01-15 DIAGNOSIS — Z0189 Encounter for other specified special examinations: Secondary | ICD-10-CM

## 2017-01-19 ENCOUNTER — Telehealth: Payer: Self-pay

## 2017-01-19 NOTE — Telephone Encounter (Signed)
Need to call patient and set up PT/INR appointment.

## 2017-01-21 ENCOUNTER — Ambulatory Visit: Payer: Self-pay

## 2017-02-03 ENCOUNTER — Other Ambulatory Visit: Payer: Self-pay | Admitting: Adult Health Nurse Practitioner

## 2017-02-03 ENCOUNTER — Encounter: Payer: Self-pay | Admitting: Internal Medicine

## 2017-02-03 ENCOUNTER — Ambulatory Visit: Payer: Self-pay | Admitting: Internal Medicine

## 2017-02-03 VITALS — BP 154/97 | HR 77 | Temp 99.0°F

## 2017-02-03 DIAGNOSIS — I4891 Unspecified atrial fibrillation: Secondary | ICD-10-CM

## 2017-02-03 DIAGNOSIS — Z0189 Encounter for other specified special examinations: Secondary | ICD-10-CM

## 2017-02-03 LAB — POCT INR

## 2017-02-03 MED ORDER — CARBIDOPA-LEVODOPA 25-250 MG PO TABS: ORAL_TABLET | ORAL | 3 refills | Status: DC

## 2017-02-03 MED ORDER — DILTIAZEM HCL ER COATED BEADS 120 MG PO CP24: 120.0000 mg | ORAL_CAPSULE | Freq: Every day | ORAL | 3 refills | Status: DC

## 2017-02-03 MED ORDER — WARFARIN SODIUM 5 MG PO TABS: 5.0000 mg | ORAL_TABLET | Freq: Once | ORAL | 3 refills | Status: DC

## 2017-02-03 MED ORDER — METOPROLOL TARTRATE 50 MG PO TABS
50.0000 mg | ORAL_TABLET | Freq: Two times a day (BID) | ORAL | 3 refills | Status: DC
Start: 2017-02-03 — End: 2017-08-20

## 2017-02-03 MED ORDER — ATORVASTATIN CALCIUM 20 MG PO TABS: 20.0000 mg | ORAL_TABLET | Freq: Every day | ORAL | 3 refills | Status: AC

## 2017-02-03 MED ORDER — FUROSEMIDE 20 MG PO TABS
20.0000 mg | ORAL_TABLET | ORAL | 3 refills | Status: DC | PRN
Start: 2017-02-03 — End: 2017-08-20

## 2017-02-03 NOTE — Progress Notes (Signed)
   Subjective:    Patient ID: Adam Roberson, male    DOB: 1929/03/06, 83 y.o.   MRN: [id number]  HPI   Pt reports that he has been doing well since his last visit about a year ago.   Pt is no longer able to walk and is having trouble with his chest. He seems to be congested and coughs up a "foam like" substance. This congestion occurs more when he is sitting down and eating. He has not lost any significant weight since his last visit which suggests that he is eating enough.       Allergies as of 02/03/2017   No Known Allergies     Medication List        Accurate as of 02/03/17 10:37 AM. Always use your most recent med list.          atorvastatin 20 MG tablet Commonly known as:  LIPITOR Take 1 tablet (20 mg total) by mouth daily.   carbidopa-levodopa 25-250 MG tablet Commonly known as:  SINEMET IR TAKE ONE TABLET BY MOUTH 3 TIMES A DAY   COUMADIN 5 MG tablet Generic drug:  warfarin TAKE 1/2 TABLET ON MONDAY AND FRIDAY. TAKE 1 TABLET ON TUESDAY, WEDNESDAY, THURSDAY, SATURDAY AND SUNDAY   diltiazem 120 MG 24 hr capsule Commonly known as:  CARDIZEM CD Take 1 capsule (120 mg total) by mouth daily.   furosemide 20 MG tablet Commonly known as:  LASIX Take 1 tablet (20 mg total) by mouth as needed.   metoprolol tartrate 50 MG tablet Commonly known as:  LOPRESSOR Take 1 tablet (50 mg total) by mouth 2 (two) times daily. Takes 1/2 tab BID   risperiDONE 1 MG tablet Commonly known as:  RISPERDAL Take 1 tablet (1 mg total) by mouth 2 (two) times daily.   traZODone 50 MG tablet Commonly known as:  DESYREL Take 1 tablet (50 mg total) by mouth at bedtime. Patient taking 1/2 tablet in the morning and 1/2 in the evening        Patient Active Problem List   Diagnosis Date Noted  . Hypertensive heart disease   . Hyperlipidemia   . CAD (coronary artery disease)   . Permanent atrial fibrillation (HCC)   . Atrial fibrillation with rapid ventricular response (HCC)  07/03/2015  . Bilateral pneumonia   . Dyspnea   . Persistent atrial fibrillation (HCC)   . Acute on chronic diastolic CHF (congestive heart failure) (HCC)   . Sepsis (HCC) 06/28/2015  . Parkinson disease (HCC) 04/09/2015  . IHD (ischemic heart disease) 10/31/2014      Review of Systems     Objective:   Physical Exam  Constitutional: He is oriented to person, place, and time.  Pulmonary/Chest: Effort normal and breath sounds normal.  Neurological: He is alert and oriented to person, place, and time.   Pt has an irregular heartbeat (diagnosis: atrial fibrillation)  BP (!) 154/97 (BP Location: Left Arm, Patient Position: Sitting, Cuff Size: Normal)   Pulse 77   Temp 99 F (37.2 C)       Assessment & Plan:   Labs ordered today: CMET, CBC, UA, and POCT INR  F/u in 3 months with POCT INR lab  F/u in 6 months with labs a week prior. CMET, CBC, POCT INR, UA  Referral to [facility] for evaluation of pt's RISPERDAL and TRAZODONE medication needs and prescription renewals.

## 2017-02-03 NOTE — Patient Instructions (Signed)
F/u in 3 months for lab: POCT INR  F/u in 6 months with labs a week prior: CMET, CBC, POCT INR, UA

## 2017-02-04 LAB — COMPREHENSIVE METABOLIC PANEL
ALBUMIN: 3.9 g/dL (ref 3.5–4.7)
ALK PHOS: 72 IU/L (ref 39–117)
ALT: 8 IU/L (ref 0–44)
AST: 14 IU/L (ref 0–40)
Albumin/Globulin Ratio: 1.6 (ref 1.2–2.2)
BUN/Creatinine Ratio: 13 (ref 10–24)
BUN: 13 mg/dL (ref 8–27)
Bilirubin Total: 0.6 mg/dL (ref 0.0–1.2)
CHLORIDE: 104 mmol/L (ref 96–106)
CO2: 22 mmol/L (ref 20–29)
CREATININE: 0.98 mg/dL (ref 0.76–1.27)
Calcium: 8.3 mg/dL — ABNORMAL LOW (ref 8.6–10.2)
GFR calc Af Amer: 80 mL/min/{1.73_m2} (ref >59)
GFR calc non Af Amer: 70 mL/min/{1.73_m2} (ref >59)
GLUCOSE: 189 mg/dL — AB (ref 65–99)
Globulin, Total: 2.5 g/dL (ref 1.5–4.5)
Potassium: 3.9 mmol/L (ref 3.5–5.2)
Sodium: 140 mmol/L (ref 134–144)
Total Protein: 6.4 g/dL (ref 6.0–8.5)

## 2017-02-04 LAB — CBC
HEMATOCRIT: 42.3 % (ref 37.5–51.0)
HEMOGLOBIN: 14.5 g/dL (ref 13.0–17.7)
MCH: 31 pg (ref 26.6–33.0)
MCHC: 34.3 g/dL (ref 31.5–35.7)
MCV: 90 fL (ref 79–97)
Platelets: 166 10*3/uL (ref 150–379)
RBC: 4.68 x10E6/uL (ref 4.14–5.80)
RDW: 13.8 % (ref 12.3–15.4)
WBC: 5.9 10*3/uL (ref 3.4–10.8)

## 2017-02-04 LAB — TSH: TSH: 1.84 u[IU]/mL (ref 0.450–4.500)

## 2017-02-24 ENCOUNTER — Other Ambulatory Visit: Payer: Self-pay | Admitting: Internal Medicine

## 2017-02-24 DIAGNOSIS — Z0189 Encounter for other specified special examinations: Secondary | ICD-10-CM

## 2017-03-03 ENCOUNTER — Other Ambulatory Visit: Payer: Self-pay

## 2017-03-03 DIAGNOSIS — Z0189 Encounter for other specified special examinations: Secondary | ICD-10-CM

## 2017-03-03 MED ORDER — WARFARIN SODIUM 5 MG PO TABS: ORAL_TABLET | ORAL | 12 refills | Status: DC

## 2017-04-02 ENCOUNTER — Ambulatory Visit (INDEPENDENT_AMBULATORY_CARE_PROVIDER_SITE_OTHER): Payer: Self-pay | Admitting: Cardiovascular Disease

## 2017-04-02 VITALS — BP 130/70 | HR 81 | Ht 64.0 in | Wt 175.0 lb

## 2017-04-02 DIAGNOSIS — I5032 Chronic diastolic (congestive) heart failure: Secondary | ICD-10-CM

## 2017-04-02 DIAGNOSIS — I482 Chronic atrial fibrillation, unspecified: Secondary | ICD-10-CM

## 2017-04-02 DIAGNOSIS — I2510 Atherosclerotic heart disease of native coronary artery without angina pectoris: Secondary | ICD-10-CM

## 2017-04-02 NOTE — Progress Notes (Signed)
Cardiology Office Note   Date:  04/02/2017   ID:  Adam Roberson, DOB February 26, 1929, MRN [id number]  [facility]  [facility]  Cardiologist:   Lorine BearsMuhammad Jacquelinne Speak, MD   Chief Complaint  Patient presents with  . Other    C/o chest pain and unable to sleep without Xanax. Meds reviewed verbally with pt.      History of Present Illness: Adam Roberson is a 84 y.o. male who presents for a follow-up visit regarding coronary artery disease, chronic diastolic heart failure and atrial fibrillation. Remote history of CABG in [place]. History of permanent atrial fibrillation. He was hospitalized in April, 2017 with pneumonia and was found to have atrial fibrillation with rapid ventricular response. Echocardiogram showed normal LV systolic function. Rate control was achieved with metoprolol and diltiazem. He had a nuclear stress test done in June 2017 for atypical chest pain.  It was abnormal with possible inferolateral and anterolateral ischemia.  Ejection fraction was normal.  I treated him medically given lack of angina.  He comes for follow-up visit.  History was obtained with the help of an interpreter on the line.  The patient is overall a poor historian and seems to have dementia.  According to the family, he had a stroke last year while he was in [place].  Since then, he had significant disturbance in his sleep pattern with hallucinations.  He has not been sleeping well at night and are requesting refills on alprazolam.  He is already on trazodone and risperidone.  There is no reported chest pain or shortness of breath.  Past Medical History:  Diagnosis Date  . CAD (coronary artery disease)    a.2003 s/p CABG x3 in [place]; b. ? 2014 PCI (records not available).  . Chronic diastolic CHF (congestive heart failure) (HCC)    a. 06/2015 Echo: EF 60-65%, mod dil LA, nl RV, mild to mod TR, PASP 46mmHg.  Marland Kitchen. DVT (deep venous thrombosis) (HCC)   . DVT of leg (deep venous thrombosis) (HCC)   .  Dysrhythmia   . H/O: CVA (cerebrovascular accident)   . HOH (hard of hearing)   . Hyperlipidemia   . Hypertension   . Hypertensive heart disease   . Parkinson disease (HCC)   . Permanent atrial fibrillation (HCC)    a. CHA2DS2VASc= 7-->coumadin (followed by primary care).  . Pneumonia    4/17  . Stroke [facility](HCC)     Past Surgical History:  Procedure Laterality Date  . CARDIAC SURGERY  age 84   bypass  . CATARACT EXTRACTION W/PHACO Left 08/27/2015   Procedure: CATARACT EXTRACTION PHACO AND INTRAOCULAR LENS PLACEMENT (IOC);  Surgeon: Lockie Molahadwick Brasington, MD;  Location: [facility] ORS;  Service: Ophthalmology;  Laterality: Left;  US 00:56 AP% 11.6 CDE 6.45 fluid pack lot # [id number]  . CATARACT EXTRACTION W/PHACO Right 11/05/2015   Procedure: CATARACT EXTRACTION PHACO AND INTRAOCULAR LENS PLACEMENT (IOC);  Surgeon: Lockie Molahadwick Brasington, MD;  Location: [facility] ORS;  Service: Ophthalmology;  Laterality: Right;  US 01:06 AP% 14.3 CDE 9.46 Fluid Pack lot # [id number]  . CORONARY ARTERY BYPASS GRAFT    . dvt    . EYE SURGERY       Current Outpatient Medications  Medication Sig Dispense Refill  . ALPRAZolam (XANAX) 0.5 MG tablet Take 0.5 mg by mouth at bedtime as needed for anxiety. Takes 3 times weekly.    Marland Kitchen. atorvastatin (LIPITOR) 20 MG tablet Take 1 tablet (20 mg total) by mouth daily. 90 tablet 3  .  carbidopa-levodopa (SINEMET IR) 25-250 MG tablet TAKE ONE TABLET BY MOUTH 3 TIMES A DAY 90 tablet 3  . diltiazem (CARDIZEM CD) 120 MG 24 hr capsule Take 1 capsule (120 mg total) by mouth daily. 90 capsule 3  . furosemide (LASIX) 20 MG tablet Take 1 tablet (20 mg total) by mouth as needed. 90 tablet 3  . metoprolol tartrate (LOPRESSOR) 50 MG tablet Take 1 tablet (50 mg total) by mouth 2 (two) times daily. Takes 1/2 tab BID 90 tablet 3  . risperiDONE (RISPERDAL) 1 MG tablet Take 1 tablet (1 mg total) by mouth 2 (two) times daily. 60 tablet 5  . traZODone (DESYREL) 50 MG tablet Take 1 tablet (50 mg total)  by mouth at bedtime. Patient taking 1/2 tablet in the morning and 1/2 in the evening 30 tablet 5  . warfarin (COUMADIN) 5 MG tablet Take 1/2 Tablet on Monday and Friday. Take 1 Tabket on Tuesday, Wednesday, Thursday, Saturday and Sunday or as directed. 30 tablet 12   No current facility-administered medications for this visit.     Allergies:   Patient has no known allergies.    Social History:  The patient  reports that he has quit smoking. he has never used smokeless tobacco. He reports that he does not drink alcohol or use drugs.   Family History:  The patient's family history includes CAD in his mother.    ROS:  Please see the history of present illness.   Otherwise, review of systems are positive for none.   All other systems are reviewed and negative.    PHYSICAL EXAM: VS:  BP 130/70 (BP Location: Left Arm, Patient Position: Sitting, Cuff Size: Normal)   Pulse 81   Ht 5\' 4"  (1.626 m)   Wt 175 lb (79.4 kg)   BMI 30.04 kg/m  , BMI Body mass index is 30.04 kg/m. GEN: Well nourished, well developed, in no acute distress  HEENT: normal  Neck: no JVD, carotid bruits, or masses Cardiac: Irregularly irregular; no rubs, or gallops,no edema .  2 out of 6 systolic murmur at the base Respiratory:  clear to auscultation bilaterally, normal work of breathing GI: soft, nontender, nondistended, + BS MS: no deformity or atrophy  Skin: warm and dry, no rash Neuro:  Strength and sensation are intact Psych: euthymic mood, full affect   EKG:  EKG is ordered today. The ekg ordered today demonstrates atrial fibrillation with ventricular rate of 81 bpm.  Nonspecific ST and T wave changes.   Recent Labs: 02/03/2017: ALT 8; BUN 13; Creatinine, Ser 0.98; Hemoglobin 14.5; Platelets 166; Potassium 3.9; Sodium 140; TSH 1.840    Lipid Panel    Component Value Date/Time   CHOL 123 08/06/2016 1958   TRIG 88 08/06/2016 1958   HDL 43 08/06/2016 1958   CHOLHDL 2.9 08/06/2016 1958   LDLCALC 62  08/06/2016 1958      Wt Readings from Last 3 Encounters:  04/02/17 175 lb (79.4 kg)  10/30/15 142 lb (64.4 kg)  10/18/15 142 lb (64.4 kg)        ASSESSMENT AND PLAN:  1.  Chronic diastolic heart failure: The patient appears to be euvolemic and he is using furosemide only as needed.  2. Chronic atrial fibrillation: Ventricular rate is controlled on metoprolol and diltiazem. He is on long-term anticoagulation with warfarin.  [facility]  3. Coronary artery disease status post CABG, he had a mildly abnormal stress test with evidence of mild ischemia  in the left circumflex distribution. However, given his age, dementia and comorbidities in addition to lack of angina, I recommend continuing medical therapy.  4.  Behavioral disturbances and insomnia in the setting of previous stroke: I suggested that the patient sees Dr. Cherylann Ratel at [facility] to help with his medications.    Disposition:   FU with me in 6 months  Signed,  Lorine Bears, MD  04/02/2017 9:18 AM    [facility]

## 2017-04-02 NOTE — Patient Instructions (Signed)
Medication Instructions:  Your physician recommends that you continue on your current medications as directed. Please refer to the Current Medication list given to you today.   Labwork: none  Testing/Procedures: none  Follow-Up: Your physician wants you to follow-up in: 6 months with Dr. Kirke CorinArida.  You will receive a reminder letter in the mail two months in advance. If you don't receive a letter, please call our office to schedule the follow-up appointment.   Any Other Special Instructions Will Be Listed Below (If Applicable). Dr. Kirke CorinArida would like for you to make an appointment with Dr. Cherylann RatelLateef, [phone]     If you need a refill on your cardiac medications before your next appointment, please call your pharmacy.

## 2017-04-14 NOTE — Addendum Note (Signed)
Addended by: Festus AloeRESPO, Ioannis Schuh G on: 04/14/2017 08:37 AM   Modules accepted: Orders

## 2017-05-05 ENCOUNTER — Other Ambulatory Visit: Payer: Self-pay

## 2017-05-21 ENCOUNTER — Telehealth: Payer: Self-pay | Admitting: Pharmacy Technician

## 2017-05-21 NOTE — Telephone Encounter (Signed)
Patient failed to provide 2019 financial documentation.  No additional medication assistance will be provided by [facility] without the required proof of income documentation.  Patient notified by letter.  Rori Goar J. Brian Kocourek Care Manager [facility] 

## 2017-05-23 ENCOUNTER — Emergency Department: Payer: Self-pay

## 2017-05-23 ENCOUNTER — Emergency Department
Admission: EM | Admit: 2017-05-23 | Discharge: 2017-05-23 | Disposition: A | Discharge status: Home / Self Care | Payer: Self-pay | Attending: Emergency Medicine | Admitting: Emergency Medicine

## 2017-05-23 ENCOUNTER — Encounter: Payer: Self-pay | Admitting: Emergency Medicine

## 2017-05-23 DIAGNOSIS — J189 Pneumonia, unspecified organism: Secondary | ICD-10-CM

## 2017-05-23 DIAGNOSIS — Z87891 Personal history of nicotine dependence: Secondary | ICD-10-CM | POA: Insufficient documentation

## 2017-05-23 DIAGNOSIS — J181 Lobar pneumonia, unspecified organism: Secondary | ICD-10-CM | POA: Insufficient documentation

## 2017-05-23 DIAGNOSIS — I2510 Atherosclerotic heart disease of native coronary artery without angina pectoris: Secondary | ICD-10-CM | POA: Insufficient documentation

## 2017-05-23 DIAGNOSIS — Z8679 Personal history of other diseases of the circulatory system: Secondary | ICD-10-CM | POA: Insufficient documentation

## 2017-05-23 DIAGNOSIS — F0280 Dementia in other diseases classified elsewhere without behavioral disturbance: Secondary | ICD-10-CM | POA: Insufficient documentation

## 2017-05-23 DIAGNOSIS — I5032 Chronic diastolic (congestive) heart failure: Secondary | ICD-10-CM | POA: Insufficient documentation

## 2017-05-23 DIAGNOSIS — G20 Parkinson's disease: Secondary | ICD-10-CM | POA: Insufficient documentation

## 2017-05-23 DIAGNOSIS — I110 Hypertensive heart disease with heart failure: Secondary | ICD-10-CM | POA: Insufficient documentation

## 2017-05-23 DIAGNOSIS — Z79899 Other long term (current) drug therapy: Secondary | ICD-10-CM | POA: Insufficient documentation

## 2017-05-23 DIAGNOSIS — R0602 Shortness of breath: Secondary | ICD-10-CM

## 2017-05-23 DIAGNOSIS — J810 Acute pulmonary edema: Secondary | ICD-10-CM | POA: Insufficient documentation

## 2017-05-23 LAB — TROPONIN I

## 2017-05-23 LAB — CBC
HCT: 40.5 % (ref 40.0–52.0)
Hemoglobin: 13.6 g/dL (ref 13.0–18.0)
MCH: 30.7 pg (ref 26.0–34.0)
MCHC: 33.7 g/dL (ref 32.0–36.0)
MCV: 91.1 fL (ref 80.0–100.0)
PLATELETS: 159 10*3/uL (ref 150–440)
RBC: 4.44 MIL/uL (ref 4.40–5.90)
RDW: 14.1 % (ref 11.5–14.5)
WBC: 9.5 10*3/uL (ref 3.8–10.6)

## 2017-05-23 LAB — BASIC METABOLIC PANEL
Anion gap: 9 (ref 5–15)
BUN: 19 mg/dL (ref 6–20)
CALCIUM: 8.4 mg/dL — AB (ref 8.9–10.3)
CHLORIDE: 100 mmol/L — AB (ref 101–111)
CO2: 24 mmol/L (ref 22–32)
CREATININE: 1.23 mg/dL (ref 0.61–1.24)
GFR, EST AFRICAN AMERICAN: 59 mL/min — AB (ref >60)
GFR, EST NON AFRICAN AMERICAN: 51 mL/min — AB (ref >60)
Glucose, Bld: 152 mg/dL — ABNORMAL HIGH (ref 65–99)
Potassium: 3.9 mmol/L (ref 3.5–5.1)
SODIUM: 133 mmol/L — AB (ref 135–145)

## 2017-05-23 LAB — BRAIN NATRIURETIC PEPTIDE: B NATRIURETIC PEPTIDE 5: 187 pg/mL — AB (ref 0.0–100.0)

## 2017-05-23 MED ORDER — AZITHROMYCIN 250 MG PO TABS: ORAL_TABLET | ORAL | 0 refills | Status: AC

## 2017-05-23 MED ORDER — SODIUM CHLORIDE 0.9 % IV SOLN
500.0000 mg | Freq: Once | INTRAVENOUS | Status: AC
  Administered 2017-05-23: 500 mg via INTRAVENOUS
  Filled 2017-05-23: qty 500

## 2017-05-23 MED ORDER — FUROSEMIDE 10 MG/ML IJ SOLN
20.0000 mg | Freq: Once | INTRAMUSCULAR | Status: AC
  Administered 2017-05-23: 20 mg via INTRAVENOUS
  Filled 2017-05-23: qty 4

## 2017-05-23 MED ORDER — FUROSEMIDE 40 MG PO TABS: 40.0000 mg | ORAL_TABLET | Freq: Every day | ORAL | 0 refills | Status: DC

## 2017-05-23 NOTE — ED Provider Notes (Signed)
[facility] Emergency Department Provider Note   ____________________________________________   First MD Initiated Contact with Patient 05/23/17 [phone]     (approximate)  I have reviewed the triage vital signs and the nursing notes.   HISTORY  Chief Complaint Shortness of Breath  Patient with dementia and unable to answer questions, history obtained by patient's wife and grandson.  HPI Adam Roberson is a 84 y.o. male who comes into the hospital today with some difficulty breathing.  The patient has a history of atrial fibrillation.  The patient's wife states that they were at home when he was laying down and coughing.  The patient seemed to be panting and breathing hard.  His blood pressure seemed to be going up as well.  In the morning his systolic was in the 180s.  They try to check it this afternoon and the blood pressure cuff would not read it so they assumed that his blood pressure was severely high.  The patient denies any chest pain.  He states that he has had some of this fluid buildup before.  The patient's wife states that his face is swollen.  The patient has had pneumonia in the past which is what they are concerned about.  Past Medical History:  Diagnosis Date  . CAD (coronary artery disease)    a.2003 [place] CAB[place] x3 in [place]; b. ? 2014 PCI (records not available).  . Chronic diastolic CHF (congestive heart failure) (HCC)    a. 06/2015 Echo: EF 60-65%, mod dil LA, nl RV, mild to mod TR, PASP 46mmHg.  Marland Kitchen. DVT (deep venous thrombosis) (HCC)   . DVT of leg (deep venous thrombosis) (HCC)   . Dysrhythmia   . H/O: CVA (cerebrovascular accident)   . HOH (hard of hearing)   . Hyperlipidemia   . Hypertension   . Hypertensive heart disease   . Parkinson disease (HCC)   . Permanent atrial fibrillation (HCC)    a. CHA2DS2VASc= 7-->coumadin (followed by primary care).  . Pneumonia    4/17  . Stroke [facility](HCC)     Patient Active Problem List   Diagnosis  Date Noted  . Hypertensive heart disease   . Hyperlipidemia   . CAD (coronary artery disease)   . Permanent atrial fibrillation (HCC)   . Atrial fibrillation with rapid ventricular response (HCC) 07/03/2015  . Bilateral pneumonia   . Dyspnea   . Persistent atrial fibrillation (HCC)   . Acute on chronic diastolic CHF (congestive heart failure) (HCC)   . Sepsis (HCC) 06/28/2015  . Parkinson disease (HCC) 04/09/2015  . IHD (ischemic heart disease) 10/31/2014    Past Surgical History:  Procedure Laterality Date  . CARDIAC SURGERY  age 84   bypass  . CATARACT EXTRACTION W/PHACO Left 08/27/2015   Procedure: CATARACT EXTRACTION PHACO AND INTRAOCULAR LENS PLACEMENT (IOC);  Surgeon: Lockie Molahadwick Brasington, MD;  Location: [facility] ORS;  Service: Ophthalmology;  Laterality: Left;  US 00:56 AP% 11.6 CDE 6.45 fluid pack lot # [id number]  . CATARACT EXTRACTION W/PHACO Right 11/05/2015   Procedure: CATARACT EXTRACTION PHACO AND INTRAOCULAR LENS PLACEMENT (IOC);  Surgeon: Lockie Molahadwick Brasington, MD;  Location: [facility] ORS;  Service: Ophthalmology;  Laterality: Right;  US 01:06 AP% 14.3 CDE 9.46 Fluid Pack lot # [id number]  . CORONARY ARTERY BYPASS GRAFT    . dvt    . EYE SURGERY    . LEG SURGERY Right     Prior to Admission medications   Medication Sig Start Date End Date Taking? Authorizing  Provider  ALPRAZolam Prudy Feeler) 0.5 MG tablet Take 0.5 mg by mouth at bedtime as needed for anxiety. Takes 3 times weekly.    [provider]  atorvastatin (LIPITOR) 20 MG tablet Take 1 tablet (20 mg total) by mouth daily. 02/03/17   Virl Axe, MD  azithromycin (ZITHROMAX Z-PAK) 250 MG tablet Take 2 tablets (500 mg) on  Day 1,  followed by 1 tablet (250 mg) once daily on Days 2 through 5. 05/23/17 05/28/17  Rebecka Apley, MD  carbidopa-levodopa (SINEMET IR) 25-250 MG tablet TAKE ONE TABLET BY MOUTH 3 TIMES A DAY 02/03/17   Virl Axe, MD  diltiazem (CARDIZEM CD) 120 MG 24 hr capsule Take 1 capsule (120 mg  total) by mouth daily. 02/03/17   Virl Axe, MD  furosemide (LASIX) 20 MG tablet Take 1 tablet (20 mg total) by mouth as needed. 02/03/17   Virl Axe, MD  furosemide (LASIX) 40 MG tablet Take 1 tablet (40 mg total) by mouth daily. 05/23/17 05/23/18  Rebecka Apley, MD  metoprolol tartrate (LOPRESSOR) 50 MG tablet Take 1 tablet (50 mg total) by mouth 2 (two) times daily. Takes 1/2 tab BID 02/03/17   Virl Axe, MD  risperiDONE (RISPERDAL) 1 MG tablet Take 1 tablet (1 mg total) by mouth 2 (two) times daily. 08/06/16   Maple Hudson., MD  traZODone (DESYREL) 50 MG tablet Take 1 tablet (50 mg total) by mouth at bedtime. Patient taking 1/2 tablet in the morning and 1/2 in the evening 08/06/16   Maple Hudson., MD  warfarin (COUMADIN) 5 MG tablet Take 1/2 Tablet on Monday and Friday. Take 1 Tabket on Tuesday, Wednesday, Thursday, Saturday and Sunday or as directed. 03/03/17   Virl Axe, MD    Allergies Patient has no known allergies.  Family History  Problem Relation Age of Onset  . CAD Mother     Social History Social History   Tobacco Use  . Smoking status: Former Games developer  . Smokeless tobacco: Never Used  Substance Use Topics  . Alcohol use: No  . Drug use: No    Review of Systems  Constitutional: No fever/chills Eyes: No visual changes. ENT: No sore throat. Cardiovascular: Denies chest pain. Respiratory: cough and shortness of breath. Gastrointestinal: No abdominal pain.  No nausea, no vomiting.  No diarrhea.  No constipation. Genitourinary: Negative for dysuria. Musculoskeletal: Negative for back pain. Skin: Negative for rash. Neurological: Negative for headaches, focal weakness or numbness.   ____________________________________________   PHYSICAL EXAM:  VITAL SIGNS: ED Triage Vitals  Enc Vitals Group     BP 05/23/17 0136 125/80     Pulse Rate 05/23/17 0136 (!) 59     Resp 05/23/17 0136 18     Temp 05/23/17 0136 98 F (36.7 C)      Temp Source 05/23/17 0136 Oral     SpO2 05/23/17 0136 95 %     Weight 05/23/17 0138 150 lb (68 kg)     Height 05/23/17 0138 5\' 3"  (1.6 m)     Head Circumference --      Peak Flow --      Pain Score 05/23/17 0137 5     Pain Loc --      Pain Edu? --      Excl. in GC? --     Constitutional: Alert and oriented. Well appearing and in mild distress. Eyes: Conjunctivae are normal. PERRL. EOMI. Head: Atraumatic. Nose: No congestion/rhinnorhea. Mouth/Throat:  Mucous membranes are moist.  Oropharynx non-erythematous. Cardiovascular: Normal rate, regular rhythm. Grossly normal heart sounds.  Good peripheral circulation. Respiratory: Normal respiratory effort.  No retractions.  Mild crackles in bilateral bases. Gastrointestinal: Soft and nontender. No distention.  Positive bowel sounds Musculoskeletal: Mild lower extremity edema Neurologic:  Normal speech and language.  Skin:  Skin is warm, dry and intact.  Psychiatric: Mood and affect are normal.   ____________________________________________   LABS (all labs ordered are listed, but only abnormal results are displayed)  Labs Reviewed  BASIC METABOLIC PANEL - Abnormal; Notable for the following components:      Result Value   Sodium 133 (*)    Chloride 100 (*)    Glucose, Bld 152 (*)    Calcium 8.4 (*)    GFR calc non Af Amer 51 (*)    GFR calc Af Amer 59 (*)    All other components within normal limits  BRAIN NATRIURETIC PEPTIDE - Abnormal; Notable for the following components:   B Natriuretic Peptide 187.0 (*)    All other components within normal limits  CBC  TROPONIN I  TROPONIN I   ____________________________________________  EKG  ED ECG REPORT I, Rebecka Apley, the attending physician, personally viewed and interpreted this ECG.   Date: 05/23/2017  EKG Time: 147  Rate: 60  Rhythm: atrial fibrillation, rate 60  Axis: none  Intervals:none  ST&T Change: T wave flattening in lead I and  aVL  ____________________________________________  RADIOLOGY  ED MD interpretation:  CXR: Cardiomegaly with vascular congestion and mild diffuse interstitial opacity, minimal infiltrate at left base.  Official radiology report(s): Dg Chest 2 View  Result Date: 05/23/2017 CLINICAL DATA:  Short of breath EXAM: CHEST - 2 VIEW COMPARISON:  07/02/2015 FINDINGS: Post sternotomy changes. Cardiomegaly with vascular congestion. Mild diffuse interstitial opacity may reflect pulmonary edema. Aortic atherosclerosis. Mild focal opacity at the left base. No pneumothorax. IMPRESSION: Cardiomegaly with vascular congestion. Mild diffuse interstitial opacity suspect for edema. Patchy atelectasis or minimal infiltrate at the left base. Electronically Signed   By: Jasmine Pang M.D.   On: 05/23/2017 03:30    ____________________________________________   PROCEDURES  Procedure(s) performed: None  Procedures  Critical Care performed: No  ____________________________________________   INITIAL IMPRESSION / ASSESSMENT AND PLAN / ED COURSE  As part of my medical decision making, I reviewed the following data within the electronic MEDICAL RECORD NUMBER Notes from prior ED visits and [place] Controlled Substance Database   This is an 84 year old male who comes into the hospital today with some shortness of breath at home.  The patient's wife did give him a dose of Lasix prior to bringing him into the hospital.  Patient is breathing comfortably without any problems at this time.  My differential diagnosis includes acute coronary syndrome, congestive heart failure, pneumonia.  I did check some blood work on the patient to include a CBC BMP 2 troponins and BNP.  The patient's BNP is only 187 and I did look at the chest x-ray which showed this left infiltrate.  Did give the patient a dose of azithromycin as well as 40 of Lasix.  I will give the patient some Lasix for home as well as some azithromycin.  The patient  should follow back up with his primary care physician for further evaluation.  The patient will be discharged home.      ____________________________________________   FINAL CLINICAL IMPRESSION(S) / ED DIAGNOSES  Final diagnoses:  Shortness of breath  Acute  pulmonary edema (HCC)  Community acquired pneumonia of left lower lobe of lung [facility])     ED Discharge Orders        Ordered    furosemide (LASIX) 40 MG tablet  Daily     05/23/17 0758    azithromycin (ZITHROMAX Z-PAK) 250 MG tablet     05/23/17 4098       Note:  This document was prepared using Dragon voice recognition software and may include unintentional dictation errors.    Rebecka Apley, MD 05/23/17 [phone]

## 2017-05-23 NOTE — Discharge Instructions (Signed)
Please follow-up with your primary care physician for further evaluation of your symptoms.  Please take Lasix daily for the next week until you can follow-up with your primary care physician.

## 2017-05-23 NOTE — ED Notes (Signed)
AAOx3.  Skin warm and dry.  NAD 

## 2017-05-23 NOTE — ED Triage Notes (Addendum)
Patient arrived from home with SOB, patient speaks [place] and interpreter is available on iPad.  VS are WNL and patient is in NAD at this time with no effort of respiration.  Pt denies fevers at home, but a cough that is non-productive.  Pt family denied COPD or other breathing problem hx.  Pt states his ambulatory status has been in decline over the last two days.  Patient has had a stroke abut a year and a half ago.  Family also states patient has Parkinson's.

## 2017-06-14 ENCOUNTER — Telehealth: Payer: Self-pay | Admitting: Pharmacy Technician

## 2017-06-14 NOTE — Telephone Encounter (Signed)
Received updated proof of income.  Patient eligible to receive medication assistance at [facility] through 2019, as long as eligibility requirements continue to be met.  [facility]

## 2017-06-16 ENCOUNTER — Other Ambulatory Visit: Payer: Self-pay | Admitting: Internal Medicine

## 2017-06-23 ENCOUNTER — Other Ambulatory Visit: Payer: Self-pay | Admitting: Psychiatry

## 2017-07-13 ENCOUNTER — Other Ambulatory Visit: Payer: Self-pay | Admitting: Internal Medicine

## 2017-08-04 ENCOUNTER — Other Ambulatory Visit: Payer: Self-pay

## 2017-08-04 DIAGNOSIS — Z0189 Encounter for other specified special examinations: Secondary | ICD-10-CM

## 2017-08-04 DIAGNOSIS — I4891 Unspecified atrial fibrillation: Secondary | ICD-10-CM

## 2017-08-04 LAB — POCT INR: INR: 2.2 (ref 2.0–3.0)

## 2017-08-04 NOTE — Progress Notes (Unsigned)
INR: 2.2 PT: 54  Spoke with Dr. Candelaria Stagers. Pt is to keep med dose the same and return to doc appt next wed with Dr. Candelaria Stagers.

## 2017-08-05 LAB — CBC
Hematocrit: 40.8 % (ref 37.5–51.0)
Hemoglobin: 13.9 g/dL (ref 13.0–17.7)
MCH: 30.7 pg (ref 26.6–33.0)
MCHC: 34.1 g/dL (ref 31.5–35.7)
MCV: 90 fL (ref 79–97)
PLATELETS: 181 10*3/uL (ref 150–450)
RBC: 4.53 x10E6/uL (ref 4.14–5.80)
RDW: 14.1 % (ref 12.3–15.4)
WBC: 5.7 10*3/uL (ref 3.4–10.8)

## 2017-08-05 LAB — COMPREHENSIVE METABOLIC PANEL
A/G RATIO: 1.2 (ref 1.2–2.2)
ALT: 7 IU/L (ref 0–44)
AST: 17 IU/L (ref 0–40)
Albumin: 3.7 g/dL (ref 3.5–4.7)
Alkaline Phosphatase: 84 IU/L (ref 39–117)
BILIRUBIN TOTAL: 0.4 mg/dL (ref 0.0–1.2)
BUN/Creatinine Ratio: 15 (ref 10–24)
BUN: 12 mg/dL (ref 8–27)
CO2: 22 mmol/L (ref 20–29)
Calcium: 8.6 mg/dL (ref 8.6–10.2)
Chloride: 103 mmol/L (ref 96–106)
Creatinine, Ser: 0.82 mg/dL (ref 0.76–1.27)
GFR calc Af Amer: 92 mL/min/{1.73_m2} (ref >59)
GFR calc non Af Amer: 80 mL/min/{1.73_m2} (ref >59)
Globulin, Total: 3 g/dL (ref 1.5–4.5)
Glucose: 176 mg/dL — ABNORMAL HIGH (ref 65–99)
POTASSIUM: 3.9 mmol/L (ref 3.5–5.2)
SODIUM: 138 mmol/L (ref 134–144)
Total Protein: 6.7 g/dL (ref 6.0–8.5)

## 2017-08-11 ENCOUNTER — Encounter: Payer: Self-pay | Admitting: Internal Medicine

## 2017-08-11 ENCOUNTER — Ambulatory Visit: Payer: Self-pay | Admitting: Internal Medicine

## 2017-08-11 VITALS — BP 149/84 | HR 82 | Temp 98.4°F | Wt 145.5 lb

## 2017-08-11 DIAGNOSIS — I5033 Acute on chronic diastolic (congestive) heart failure: Secondary | ICD-10-CM

## 2017-08-11 DIAGNOSIS — G20 Parkinson's disease: Secondary | ICD-10-CM

## 2017-08-11 MED ORDER — CARBIDOPA-LEVODOPA 25-250 MG PO TABS: ORAL_TABLET | ORAL | 3 refills | Status: DC

## 2017-08-11 MED ORDER — FUROSEMIDE 40 MG PO TABS: 40.0000 mg | ORAL_TABLET | Freq: Every day | ORAL | 3 refills | Status: AC

## 2017-08-11 NOTE — Progress Notes (Signed)
Subjective:    Patient ID: Adam Roberson, male    DOB: 11-18-1928, 84 y.o.   MRN: [id number]  HPI   Pt is here in need of a referral letter for physical therapy for mobility issues. He is also in need of medication refills.   Pt visited ED back in March for pneumonia, but has improved since then. He still wheezes every once in a while.    Allergies as of 08/11/2017   No Known Allergies     Medication List        Accurate as of 08/11/17 11:12 AM. Always use your most recent med list.          ALPRAZolam 0.5 MG tablet Commonly known as:  XANAX Take 0.5 mg by mouth at bedtime as needed for anxiety. Takes 3 times weekly.   atorvastatin 20 MG tablet Commonly known as:  LIPITOR Take 1 tablet (20 mg total) by mouth daily.   carbidopa-levodopa 25-250 MG tablet Commonly known as:  SINEMET IR TAKE ONE TABLET BY MOUTH 3 TIMES A DAY   diltiazem 120 MG 24 hr capsule Commonly known as:  CARDIZEM CD Take 1 capsule (120 mg total) by mouth daily.   furosemide 20 MG tablet Commonly known as:  LASIX Take 1 tablet (20 mg total) by mouth as needed.   furosemide 40 MG tablet Commonly known as:  LASIX Take 1 tablet (40 mg total) by mouth daily.   metoprolol tartrate 50 MG tablet Commonly known as:  LOPRESSOR Take 1 tablet (50 mg total) by mouth 2 (two) times daily. Takes 1/2 tab BID   risperiDONE 1 MG tablet Commonly known as:  RISPERDAL Take 1 tablet (1 mg total) by mouth 2 (two) times daily.   traZODone 50 MG tablet Commonly known as:  DESYREL Take 1 tablet (50 mg total) by mouth at bedtime. Patient taking 1/2 tablet in the morning and 1/2 in the evening   warfarin 5 MG tablet Commonly known as:  COUMADIN Take 1/2 Tablet on Monday and Friday. Take 1 Tabket on Tuesday, Wednesday, Thursday, Saturday and Sunday or as directed.      Patient Active Problem List   Diagnosis Date Noted  . Hypertensive heart disease   . Hyperlipidemia   . CAD (coronary artery disease)     . Permanent atrial fibrillation (HCC)   . Atrial fibrillation with rapid ventricular response (HCC) 07/03/2015  . Bilateral pneumonia   . Dyspnea   . Persistent atrial fibrillation (HCC)   . Acute on chronic diastolic CHF (congestive heart failure) (HCC)   . Sepsis (HCC) 06/28/2015  . Parkinson disease (HCC) 04/09/2015  . IHD (ischemic heart disease) 10/31/2014      Review of Systems     Objective:   Physical Exam  Constitutional: He is oriented to person, place, and time.  Cardiovascular: Normal rate, regular rhythm and normal heart sounds.  Pulmonary/Chest: Effort normal and breath sounds normal.  Neurological: He is alert and oriented to person, place, and time.    BP (!) 149/84   Pulse 82   Temp 98.4 F (36.9 C)   Wt 145 lb 8 oz (66 kg)   BMI 25.77 kg/m    Pt is lethargic. Lungs are clear without wheezes. Cardiac rhythm is irregular with cardiomyopathy. Good ventricular rate control. No obvious peripheral edema.       Assessment & Plan:   Rtc in 6 months with labs. Refill prescriptions.   Refer pt to [facility]  for physical therapy (general weakness--age related).

## 2017-08-17 ENCOUNTER — Inpatient Hospital Stay
Admission: EM | Admit: 2017-08-17 | Discharge: 2017-08-20 | DRG: 871 | Disposition: A | Discharge status: Home / Self Care | Payer: Medicaid Other | Attending: Internal Medicine | Admitting: Internal Medicine

## 2017-08-17 ENCOUNTER — Emergency Department: Payer: Medicaid Other

## 2017-08-17 DIAGNOSIS — Z79899 Other long term (current) drug therapy: Secondary | ICD-10-CM

## 2017-08-17 DIAGNOSIS — I5032 Chronic diastolic (congestive) heart failure: Secondary | ICD-10-CM | POA: Diagnosis present

## 2017-08-17 DIAGNOSIS — I482 Chronic atrial fibrillation: Secondary | ICD-10-CM | POA: Diagnosis present

## 2017-08-17 DIAGNOSIS — A419 Sepsis, unspecified organism: Principal | ICD-10-CM | POA: Diagnosis present

## 2017-08-17 DIAGNOSIS — Z0189 Encounter for other specified special examinations: Secondary | ICD-10-CM

## 2017-08-17 DIAGNOSIS — M216X9 Other acquired deformities of unspecified foot: Secondary | ICD-10-CM

## 2017-08-17 DIAGNOSIS — Z8673 Personal history of transient ischemic attack (TIA), and cerebral infarction without residual deficits: Secondary | ICD-10-CM

## 2017-08-17 DIAGNOSIS — Z86718 Personal history of other venous thrombosis and embolism: Secondary | ICD-10-CM

## 2017-08-17 DIAGNOSIS — H9190 Unspecified hearing loss, unspecified ear: Secondary | ICD-10-CM | POA: Diagnosis present

## 2017-08-17 DIAGNOSIS — I2510 Atherosclerotic heart disease of native coronary artery without angina pectoris: Secondary | ICD-10-CM | POA: Diagnosis present

## 2017-08-17 DIAGNOSIS — E785 Hyperlipidemia, unspecified: Secondary | ICD-10-CM | POA: Diagnosis present

## 2017-08-17 DIAGNOSIS — Z87891 Personal history of nicotine dependence: Secondary | ICD-10-CM

## 2017-08-17 DIAGNOSIS — J189 Pneumonia, unspecified organism: Secondary | ICD-10-CM | POA: Diagnosis present

## 2017-08-17 DIAGNOSIS — Y95 Nosocomial condition: Secondary | ICD-10-CM | POA: Diagnosis present

## 2017-08-17 DIAGNOSIS — F0280 Dementia in other diseases classified elsewhere without behavioral disturbance: Secondary | ICD-10-CM | POA: Diagnosis present

## 2017-08-17 DIAGNOSIS — I110 Hypertensive heart disease with heart failure: Secondary | ICD-10-CM | POA: Diagnosis present

## 2017-08-17 DIAGNOSIS — G20 Parkinson's disease: Secondary | ICD-10-CM | POA: Diagnosis present

## 2017-08-17 DIAGNOSIS — M79652 Pain in left thigh: Secondary | ICD-10-CM | POA: Diagnosis not present

## 2017-08-17 DIAGNOSIS — Z7901 Long term (current) use of anticoagulants: Secondary | ICD-10-CM

## 2017-08-17 DIAGNOSIS — Z951 Presence of aortocoronary bypass graft: Secondary | ICD-10-CM

## 2017-08-17 DIAGNOSIS — R4182 Altered mental status, unspecified: Secondary | ICD-10-CM

## 2017-08-17 DIAGNOSIS — Z9861 Coronary angioplasty status: Secondary | ICD-10-CM

## 2017-08-17 DIAGNOSIS — N179 Acute kidney failure, unspecified: Secondary | ICD-10-CM | POA: Diagnosis present

## 2017-08-17 MED ORDER — SODIUM CHLORIDE 0.9 % IV BOLUS (SEPSIS)
1000.0000 mL | Freq: Once | INTRAVENOUS | Status: AC
  Administered 2017-08-18: 1000 mL via INTRAVENOUS

## 2017-08-17 NOTE — ED Provider Notes (Signed)
[facility] Emergency Department Provider Note  ____________________________________________   None    (approximate)  I have reviewed the triage vital signs and the nursing notes.   HISTORY  Chief Complaint Altered Mental Status and Weakness  Level 5 caveat:  history/ROS limited by acute/critical illness.  Patient also does not speak [place] and his adult grandson is at bedside and  Interpreting but the patient is too ill to provide history regardless.   HPI Adam Roberson is a 84 y.o. male with extensive past medical history as listed below which includes Parkinson's disease and A. fib with RVR on warfarin.  He presents by private vehicle with his wife and grandson for evaluation of altered mental status.  The symptoms reportedly started acutely this morning and have persisted now for the entirety of the day.  Tonight he was getting ready for bed but was shuttering and shivering so hard that his family felt he needed further evaluation.  He feels hot but they do not have a thermometer to check his fever.  He has been incontinent of urine which does happen sometimes but not consistently.  He has had a cough but the cough is been present since he was diagnosed with pneumonia a couple of months ago; it never completely resolved but he did get better overall.  He has not had any nausea or vomiting and is not complained of abdominal pain.  He has not had any specific complaints except for some burning when he urinates recently, but he is less able to communicate and has an overall decreased level of responsiveness.    Past Medical History:  Diagnosis Date  . CAD (coronary artery disease)    a.2003 s/p CABG x3 in [place]; b. ? 2014 PCI (records not available).  . Chronic diastolic CHF (congestive heart failure) (HCC)    a. 06/2015 Echo: EF 60-65%, mod dil LA, nl RV, mild to mod TR, PASP 46mmHg.  Marland Kitchen. DVT (deep venous thrombosis) (HCC)   . DVT of leg (deep venous  thrombosis) (HCC)   . Dysrhythmia   . H/O: CVA (cerebrovascular accident)   . HOH (hard of hearing)   . Hyperlipidemia   . Hypertension   . Hypertensive heart disease   . Parkinson disease (HCC)   . Permanent atrial fibrillation (HCC)    a. CHA2DS2VASc= 7-->coumadin (followed by primary care).  . Pneumonia    4/17  . Stroke [facility](HCC)     Patient Active Problem List   Diagnosis Date Noted  . HCAP (healthcare-associated pneumonia) 08/18/2017  . Hypertensive heart disease   . Hyperlipidemia   . CAD (coronary artery disease)   . Permanent atrial fibrillation (HCC)   . Atrial fibrillation with rapid ventricular response (HCC) 07/03/2015  . Bilateral pneumonia   . Dyspnea   . Persistent atrial fibrillation (HCC)   . Acute on chronic diastolic CHF (congestive heart failure) (HCC)   . Sepsis (HCC) 06/28/2015  . Parkinson disease (HCC) 04/09/2015  . IHD (ischemic heart disease) 10/31/2014    Past Surgical History:  Procedure Laterality Date  . CARDIAC SURGERY  age 84   bypass  . CATARACT EXTRACTION W/PHACO Left 08/27/2015   Procedure: CATARACT EXTRACTION PHACO AND INTRAOCULAR LENS PLACEMENT (IOC);  Surgeon: Lockie Molahadwick Brasington, MD;  Location: [facility] ORS;  Service: Ophthalmology;  Laterality: Left;  US 00:56 AP% 11.6 CDE 6.45 fluid pack lot # [id number]  . CATARACT EXTRACTION W/PHACO Right 11/05/2015   Procedure: CATARACT EXTRACTION PHACO AND INTRAOCULAR LENS PLACEMENT (IOC);  Surgeon: Lockie Mola, MD;  Location: [facility] ORS;  Service: Ophthalmology;  Laterality: Right;  [place] 01:06 AP% 14.3 CDE 9.46 Fluid Pack lot # [id number]  . CORONARY ARTERY BYPASS GRAFT    . dvt    . EYE SURGERY    . LEG SURGERY Right     Prior to Admission medications   Medication Sig Start Date End Date Taking? Authorizing Provider  ALPRAZolam Prudy Feeler) 0.5 MG tablet Take 0.5 mg by mouth at bedtime as needed for anxiety. Takes 3 times weekly.    [provider]  atorvastatin (LIPITOR) 20 MG tablet  Take 1 tablet (20 mg total) by mouth daily. 02/03/17   Virl Axe, MD  carbidopa-levodopa (SINEMET IR) 25-250 MG tablet Take one tablet by mouth three times a day. 08/11/17   Virl Axe, MD  diltiazem (CARDIZEM CD) 120 MG 24 hr capsule Take 1 capsule (120 mg total) by mouth daily. 02/03/17   Virl Axe, MD  furosemide (LASIX) 20 MG tablet Take 1 tablet (20 mg total) by mouth as needed. Patient not taking: Reported on 08/11/2017 02/03/17   Virl Axe, MD  furosemide (LASIX) 40 MG tablet Take 1 tablet (40 mg total) by mouth daily. 08/11/17 08/11/18  Virl Axe, MD  metoprolol tartrate (LOPRESSOR) 50 MG tablet Take 1 tablet (50 mg total) by mouth 2 (two) times daily. Takes 1/2 tab BID 02/03/17   Virl Axe, MD  risperiDONE (RISPERDAL) 1 MG tablet Take 1 tablet (1 mg total) by mouth 2 (two) times daily. 08/06/16   Maple Hudson., MD  traZODone (DESYREL) 50 MG tablet Take 1 tablet (50 mg total) by mouth at bedtime. Patient taking 1/2 tablet in the morning and 1/2 in the evening 08/06/16   Maple Hudson., MD  warfarin (COUMADIN) 5 MG tablet Take 1/2 Tablet on Monday and Friday. Take 1 Tabket on Tuesday, Wednesday, Thursday, Saturday and Sunday or as directed. 03/03/17   Virl Axe, MD    Allergies Patient has no known allergies.  Family History  Problem Relation Age of Onset  . CAD Mother     Social History Social History   Tobacco Use  . Smoking status: Former Games developer  . Smokeless tobacco: Never Used  Substance Use Topics  . Alcohol use: No  . Drug use: No    Review of Systems Level 5 caveat:  history/ROS limited by acute/critical illness.  As documented above in HPI, history is notable for chills, altered mental status, decreased responsiveness, increased urinary incontinence, as well as a report of dysuria. ____________________________________________   PHYSICAL EXAM:  VITAL SIGNS: ED Triage Vitals [08/17/17 2342]  Enc Vitals Group     BP (!)  147/89     Pulse Rate (!) 106     Resp 20     Temp      Temp src      SpO2 97 %     Weight      Height      Head Circumference      Peak Flow      Pain Score      Pain Loc      Pain Edu?      Excl. in GC?     Constitutional: Elderly and appears acutely ill but nontoxic Eyes: Conjunctivae are normal.  Head: Atraumatic. Nose: No congestion/rhinnorhea. Mouth/Throat: Mucous membranes are dry. Neck: No stridor.  No meningeal signs.   Cardiovascular: Tachycardia with irregularly irregular rhythm consistent  with A. fib with RVR. Good peripheral circulation. Grossly normal heart sounds. Respiratory: Normal respiratory effort but increased rate.  No retractions. Lungs CTAB. Gastrointestinal: Soft with mild, diffuse abdominal tenderness palpation but no focal tenderness and no distention. Musculoskeletal: No lower extremity tenderness nor edema. No gross deformities of extremities. Neurologic: Unable to participate in neurological exam, but patient is moving all 4 extremities.  He has some mild cogwheel rigidity in his upper extremities consistent with his Parkinson's history Skin:  Skin is warm to the point of feeling febrile, dry and intact. No rash noted.   ____________________________________________   LABS (all labs ordered are listed, but only abnormal results are displayed)  Labs Reviewed  COMPREHENSIVE METABOLIC PANEL - Abnormal; Notable for the following components:      Result Value   Sodium 134 (*)    Glucose, Bld 160 (*)    Creatinine, Ser 1.32 (*)    Calcium 8.5 (*)    Albumin 3.4 (*)    AST 54 (*)    ALT 9 (*)    Total Bilirubin 1.5 (*)    GFR calc non Af Amer 47 (*)    GFR calc Af Amer 54 (*)    All other components within normal limits  CBC WITH DIFFERENTIAL/PLATELET - Abnormal; Notable for the following components:   WBC 11.1 (*)    Neutro Abs 9.9 (*)    Lymphs Abs 0.4 (*)    All other components within normal limits  PROTIME-INR - Abnormal; Notable for  the following components:   Prothrombin Time 29.7 (*)    All other components within normal limits  BRAIN NATRIURETIC PEPTIDE - Abnormal; Notable for the following components:   B Natriuretic Peptide 242.0 (*)    All other components within normal limits  TROPONIN I - Abnormal; Notable for the following components:   Troponin I 0.05 (*)    All other components within normal limits  CULTURE, BLOOD (ROUTINE X 2)  CULTURE, BLOOD (ROUTINE X 2)  URINE CULTURE  LACTIC ACID, PLASMA  LIPASE, BLOOD  PROCALCITONIN  TSH  URINALYSIS, COMPLETE (UACMP) WITH MICROSCOPIC  HEMOGLOBIN A1C   ____________________________________________  EKG  ED ECG REPORT I, Loleta Rose, the attending physician, personally viewed and interpreted this ECG.  Date: 08/17/2017 EKG Time: 23: 40 Rate: 125 Rhythm: Atrial fibrillation with RVR QRS Axis: normal Intervals: Abnormal due to A. fib and minimal criteria for LVH ST/T Wave abnormalities: Non-specific ST segment / T-wave changes, but no evidence of acute ischemia. Narrative Interpretation: no definitive evidence of acute ischemia, does not meet STEMI criteria   ____________________________________________  RADIOLOGY I, Loleta Rose, personally viewed and evaluated these images (plain radiographs) as part of my medical decision making, as well as reviewing the written report by the radiologist.  ED MD interpretation:  Suspect diffuse pneumonia based on clinical picture  Official radiology report(s): Dg Chest Portable 1 View  Result Date: 08/18/2017 CLINICAL DATA:  Acute onset of fever, generalized weakness and altered mental status. EXAM: PORTABLE CHEST 1 VIEW COMPARISON:  Chest radiograph performed 05/23/2017 FINDINGS: The lungs are well-aerated. A small right pleural effusion is noted. Hazy left-sided peripheral opacity could reflect pneumonia or interstitial edema. There is no evidence of pneumothorax. The cardiomediastinal silhouette is borderline  normal in size. The patient is status post median sternotomy. No acute osseous abnormalities are seen. IMPRESSION: Small right pleural effusion noted. Hazy left-sided peripheral opacity could reflect pneumonia or interstitial edema. Electronically Signed   By: Beryle Beams.D.  On: 08/18/2017 00:30    ____________________________________________   PROCEDURES  Critical Care performed: Yes, see critical care procedure note(s)   Procedure(s) performed:   .Critical Care Performed by: Loleta Rose, MD Authorized by: Loleta Rose, MD   Critical care provider statement:    Critical care time (minutes):  30   Critical care time was exclusive of:  Separately billable procedures and treating other patients   Critical care was necessary to treat or prevent imminent or life-threatening deterioration of the following conditions:  Sepsis   Critical care was time spent personally by me on the following activities:  Development of treatment plan with patient or surrogate, discussions with consultants, evaluation of patient's response to treatment, examination of patient, obtaining history from patient or surrogate, ordering and performing treatments and interventions, ordering and review of laboratory studies, ordering and review of radiographic studies, pulse oximetry, re-evaluation of patient's condition and review of old charts     ____________________________________________   INITIAL IMPRESSION / ASSESSMENT AND PLAN / ED COURSE  As part of my medical decision making, I reviewed the following data within the electronic MEDICAL RECORD NUMBER History obtained from family, Nursing notes reviewed and incorporated, Labs reviewed , EKG interpreted , Old chart reviewed, Radiograph reviewed , Discussed with admitting physician  and Notes from prior ED visits   Clinical Course as of Aug 18 721  Tue Aug 17, 2017  2357 I saw the patient as soon as he came to the exam room.  I am certainly concerned  about sepsis.  He is tachycardic and feels febrile although he was not able to provide an oral temperature; we will check a rectal temp but given his altered mental status, decreased functional capacity, tachycardia, report of dysuria, recent pneumonia diagnosis, etc., I am initiating code sepsis.  I am going to go ahead and order cefepime 2 g IV and vancomycin 1 g IV, but I requested that nursing staff before and out catheterization for a urine sample and get blood cultures before starting antibiotics.  I ordered 1 L of normal saline IV bolus since there is no evidence of shock at this point but I cannot adjust that as needed.  Differential diagnosis includes, but is not limited to, urinary tract infection, worsening pneumonia not completely treated with recent outpatient antibiotics, acute intra-abdominal infection, CVA/hemorrhage.  The patient is on warfarin but I think acute intracranial hemorrhage is less likely than infection based on his overall clinical picture.  Once he is stable fluids and antibiotics and his work-up is being process, I will send him for a head CT given the altered mental status while on Coumadin, but I think this is less important than the septic work-up.  I have explained this to the grandson's wife who understand and agree with the current plan.    [CF]  Wed Aug 18, 2017  0049 Suspect pneumonia based on clinical picture rather than interstitial edema.  DG Chest Portable 1 View [CF]  0049 Resp(!): 34 [CF]  0124 WBC(!): 11.1 [CF]  0131 Lactic Acid, Venous: 1.9 [CF]  0131 No evidence of severe sepsis but the patient definitely is septic meet sepsis criteria.  He is hemodynamically stable at this time.  I have updated family and I will admit to the hospitalist.Of note, there was not enough urine in the catheterization sample to run a urinalysis, but the urine culture is pending   [CF]  0132 More likely cause the patient's symptoms.  I suspect, however, that the abnormal  findings on chest x-ray reflective of left-sided   [CF]  I reviewed all the patient's labs are notable for a slightly elevated troponin of 0.0 5:00 letter and no significant abnormalities on metabolic panel.  Clinical Course User Index [CF] Loleta Rose, MD    ____________________________________________  FINAL CLINICAL IMPRESSION(S) / ED DIAGNOSES  Final diagnoses:  Sepsis, due to unspecified organism [facility])  Community acquired pneumonia of left lung, unspecified part of lung  Altered mental status, unspecified altered mental status type  Demand ischemia Elevated troponin I   MEDICATIONS GIVEN DURING THIS VISIT:  Medications  0.9 %  sodium chloride infusion ( Intravenous New Bag/Given 08/18/17 0407)  acetaminophen (TYLENOL) tablet 650 mg (has no administration in time range)    Or  acetaminophen (TYLENOL) suppository 650 mg (has no administration in time range)  docusate sodium (COLACE) capsule 100 mg (100 mg Oral Not Given 08/18/17 0422)  ondansetron (ZOFRAN) tablet 4 mg (has no administration in time range)    Or  ondansetron (ZOFRAN) injection 4 mg (has no administration in time range)  vancomycin (VANCOCIN) IVPB 750 mg/150 ml premix (has no administration in time range)  ceFEPIme (MAXIPIME) 2 g in sodium chloride 0.9 % 100 mL IVPB (has no administration in time range)  MEDLINE mouth rinse (has no administration in time range)  sodium chloride 0.9 % bolus 1,000 mL (0 mLs Intravenous Stopped 08/18/17 0254)  ceFEPIme (MAXIPIME) 2 g in sodium chloride 0.9 % 100 mL IVPB (0 g Intravenous Stopped 08/18/17 0110)  vancomycin (VANCOCIN) IVPB 1000 mg/200 mL premix (0 mg Intravenous Stopped 08/18/17 0150)  acetaminophen (TYLENOL) tablet 1,000 mg (1,000 mg Oral Given 08/18/17 0043)     ED Discharge Orders    None       Note:  This document was prepared using Dragon voice recognition software and may include unintentional dictation errors.    Loleta Rose, MD 08/18/17 [phone]

## 2017-08-17 NOTE — ED Triage Notes (Signed)
Patient coming from home for suspected fever, weakness, and altered mental status. Patient not able to stand with assistance today - this is not his baseline.  Patient has hx of dementia, parkinsons, stroke,  and significant cardiac hx per family.   Patient would not allow this RN to take an oral temperature today.

## 2017-08-18 ENCOUNTER — Emergency Department: Payer: Medicaid Other

## 2017-08-18 ENCOUNTER — Inpatient Hospital Stay: Payer: Medicaid Other

## 2017-08-18 DIAGNOSIS — J189 Pneumonia, unspecified organism: Secondary | ICD-10-CM | POA: Diagnosis present

## 2017-08-18 DIAGNOSIS — I5032 Chronic diastolic (congestive) heart failure: Secondary | ICD-10-CM | POA: Diagnosis present

## 2017-08-18 DIAGNOSIS — M79652 Pain in left thigh: Secondary | ICD-10-CM | POA: Diagnosis not present

## 2017-08-18 DIAGNOSIS — N179 Acute kidney failure, unspecified: Secondary | ICD-10-CM | POA: Diagnosis present

## 2017-08-18 DIAGNOSIS — Z87891 Personal history of nicotine dependence: Secondary | ICD-10-CM | POA: Diagnosis not present

## 2017-08-18 DIAGNOSIS — Z8673 Personal history of transient ischemic attack (TIA), and cerebral infarction without residual deficits: Secondary | ICD-10-CM | POA: Diagnosis not present

## 2017-08-18 DIAGNOSIS — E785 Hyperlipidemia, unspecified: Secondary | ICD-10-CM | POA: Diagnosis present

## 2017-08-18 DIAGNOSIS — Z86718 Personal history of other venous thrombosis and embolism: Secondary | ICD-10-CM | POA: Diagnosis not present

## 2017-08-18 DIAGNOSIS — H9190 Unspecified hearing loss, unspecified ear: Secondary | ICD-10-CM | POA: Diagnosis present

## 2017-08-18 DIAGNOSIS — I110 Hypertensive heart disease with heart failure: Secondary | ICD-10-CM | POA: Diagnosis present

## 2017-08-18 DIAGNOSIS — I2510 Atherosclerotic heart disease of native coronary artery without angina pectoris: Secondary | ICD-10-CM | POA: Diagnosis present

## 2017-08-18 DIAGNOSIS — Z7901 Long term (current) use of anticoagulants: Secondary | ICD-10-CM | POA: Diagnosis not present

## 2017-08-18 DIAGNOSIS — Z79899 Other long term (current) drug therapy: Secondary | ICD-10-CM | POA: Diagnosis not present

## 2017-08-18 DIAGNOSIS — Z951 Presence of aortocoronary bypass graft: Secondary | ICD-10-CM | POA: Diagnosis not present

## 2017-08-18 DIAGNOSIS — F0280 Dementia in other diseases classified elsewhere without behavioral disturbance: Secondary | ICD-10-CM | POA: Diagnosis present

## 2017-08-18 DIAGNOSIS — A419 Sepsis, unspecified organism: Secondary | ICD-10-CM | POA: Diagnosis not present

## 2017-08-18 DIAGNOSIS — G20 Parkinson's disease: Secondary | ICD-10-CM | POA: Diagnosis present

## 2017-08-18 DIAGNOSIS — Z9861 Coronary angioplasty status: Secondary | ICD-10-CM | POA: Diagnosis not present

## 2017-08-18 DIAGNOSIS — I482 Chronic atrial fibrillation: Secondary | ICD-10-CM | POA: Diagnosis present

## 2017-08-18 DIAGNOSIS — Y95 Nosocomial condition: Secondary | ICD-10-CM | POA: Diagnosis present

## 2017-08-18 LAB — CBC WITH DIFFERENTIAL/PLATELET
Basophils Absolute: 0 10*3/uL (ref 0–0.1)
Basophils Relative: 0 %
EOS ABS: 0 10*3/uL (ref 0–0.7)
EOS PCT: 0 %
HCT: 42.1 % (ref 40.0–52.0)
HEMOGLOBIN: 14.5 g/dL (ref 13.0–18.0)
LYMPHS ABS: 0.4 10*3/uL — AB (ref 1.0–3.6)
Lymphocytes Relative: 3 %
MCH: 31 pg (ref 26.0–34.0)
MCHC: 34.5 g/dL (ref 32.0–36.0)
MCV: 89.7 fL (ref 80.0–100.0)
MONO ABS: 0.8 10*3/uL (ref 0.2–1.0)
MONOS PCT: 8 %
Neutro Abs: 9.9 10*3/uL — ABNORMAL HIGH (ref 1.4–6.5)
Neutrophils Relative %: 89 %
PLATELETS: 170 10*3/uL (ref 150–440)
RBC: 4.7 MIL/uL (ref 4.40–5.90)
RDW: 13.9 % (ref 11.5–14.5)
WBC: 11.1 10*3/uL — ABNORMAL HIGH (ref 3.8–10.6)

## 2017-08-18 LAB — URINALYSIS, COMPLETE (UACMP) WITH MICROSCOPIC
Bilirubin Urine: NEGATIVE
Glucose, UA: NEGATIVE mg/dL
Ketones, ur: NEGATIVE mg/dL
Leukocytes, UA: NEGATIVE
Nitrite: NEGATIVE
Protein, ur: 30 mg/dL — AB
Specific Gravity, Urine: 1.01 (ref 1.005–1.030)
pH: 6 (ref 5.0–8.0)

## 2017-08-18 LAB — COMPREHENSIVE METABOLIC PANEL
ALBUMIN: 3.4 g/dL — AB (ref 3.5–5.0)
ALK PHOS: 85 U/L (ref 38–126)
ALT: 9 U/L — AB (ref 17–63)
AST: 54 U/L — ABNORMAL HIGH (ref 15–41)
Anion gap: 10 (ref 5–15)
BUN: 17 mg/dL (ref 6–20)
CALCIUM: 8.5 mg/dL — AB (ref 8.9–10.3)
CO2: 23 mmol/L (ref 22–32)
CREATININE: 1.32 mg/dL — AB (ref 0.61–1.24)
Chloride: 101 mmol/L (ref 101–111)
GFR calc Af Amer: 54 mL/min — ABNORMAL LOW (ref >60)
GFR calc non Af Amer: 47 mL/min — ABNORMAL LOW (ref >60)
GLUCOSE: 160 mg/dL — AB (ref 65–99)
Potassium: 3.9 mmol/L (ref 3.5–5.1)
SODIUM: 134 mmol/L — AB (ref 135–145)
Total Bilirubin: 1.5 mg/dL — ABNORMAL HIGH (ref 0.3–1.2)
Total Protein: 7.1 g/dL (ref 6.5–8.1)

## 2017-08-18 LAB — TSH: TSH: 1.608 u[IU]/mL (ref 0.350–4.500)

## 2017-08-18 LAB — MRSA PCR SCREENING: MRSA BY PCR: NEGATIVE

## 2017-08-18 LAB — PROTIME-INR
INR: 2.85
PROTHROMBIN TIME: 29.7 s — AB (ref 11.4–15.2)

## 2017-08-18 LAB — PROCALCITONIN: Procalcitonin: <0.1 ng/mL

## 2017-08-18 LAB — BRAIN NATRIURETIC PEPTIDE: B NATRIURETIC PEPTIDE 5: 242 pg/mL — AB (ref 0.0–100.0)

## 2017-08-18 LAB — LIPASE, BLOOD: Lipase: 34 U/L (ref 11–51)

## 2017-08-18 LAB — TROPONIN I: Troponin I: 0.05 ng/mL (ref <0.03)

## 2017-08-18 LAB — LACTIC ACID, PLASMA: Lactic Acid, Venous: 1.9 mmol/L (ref 0.5–1.9)

## 2017-08-18 LAB — HEMOGLOBIN A1C
Hgb A1c MFr Bld: 5.7 % — ABNORMAL HIGH (ref 4.8–5.6)
Mean Plasma Glucose: 116.89 mg/dL

## 2017-08-18 MED ORDER — SODIUM CHLORIDE 0.9 % IV SOLN
2.0000 g | INTRAVENOUS | Status: DC
  Filled 2017-08-18: qty 2

## 2017-08-18 MED ORDER — SODIUM CHLORIDE 0.9 % IV SOLN
2.0000 g | Freq: Once | INTRAVENOUS | Status: AC
  Administered 2017-08-18: 2 g via INTRAVENOUS
  Filled 2017-08-18: qty 2

## 2017-08-18 MED ORDER — SODIUM CHLORIDE 0.9 % IV SOLN
INTRAVENOUS | Status: DC
  Administered 2017-08-18 (×2): via INTRAVENOUS

## 2017-08-18 MED ORDER — RISPERIDONE 1 MG PO TABS
1.0000 mg | ORAL_TABLET | Freq: Two times a day (BID) | ORAL | Status: DC
  Administered 2017-08-18 – 2017-08-20 (×5): 1 mg via ORAL
  Filled 2017-08-18 (×7): qty 1

## 2017-08-18 MED ORDER — WARFARIN SODIUM 2.5 MG PO TABS: 2.5000 mg | ORAL_TABLET | ORAL | Status: DC

## 2017-08-18 MED ORDER — ATORVASTATIN CALCIUM 20 MG PO TABS
20.0000 mg | ORAL_TABLET | Freq: Every day | ORAL | Status: DC
  Administered 2017-08-18 – 2017-08-20 (×3): 20 mg via ORAL
  Filled 2017-08-18 (×3): qty 1

## 2017-08-18 MED ORDER — ACETAMINOPHEN 650 MG RE SUPP: 650.0000 mg | Freq: Four times a day (QID) | RECTAL | Status: DC | PRN

## 2017-08-18 MED ORDER — VANCOMYCIN HCL IN DEXTROSE 750-5 MG/150ML-% IV SOLN
750.0000 mg | Freq: Two times a day (BID) | INTRAVENOUS | Status: DC
  Filled 2017-08-18 (×2): qty 150

## 2017-08-18 MED ORDER — METOPROLOL TARTRATE 25 MG PO TABS
25.0000 mg | ORAL_TABLET | Freq: Two times a day (BID) | ORAL | Status: DC
  Administered 2017-08-18 – 2017-08-19 (×4): 25 mg via ORAL
  Filled 2017-08-18 (×5): qty 1

## 2017-08-18 MED ORDER — ACETAMINOPHEN 325 MG PO TABS
650.0000 mg | ORAL_TABLET | Freq: Four times a day (QID) | ORAL | Status: DC | PRN
  Administered 2017-08-18 – 2017-08-19 (×4): 650 mg via ORAL
  Filled 2017-08-18 (×4): qty 2

## 2017-08-18 MED ORDER — HEPARIN SODIUM (PORCINE) 5000 UNIT/ML IJ SOLN: 5000.0000 [IU] | Freq: Three times a day (TID) | INTRAMUSCULAR | Status: DC

## 2017-08-18 MED ORDER — DOCUSATE SODIUM 100 MG PO CAPS
100.0000 mg | ORAL_CAPSULE | Freq: Two times a day (BID) | ORAL | Status: DC
  Administered 2017-08-18 – 2017-08-20 (×5): 100 mg via ORAL
  Filled 2017-08-18 (×5): qty 1

## 2017-08-18 MED ORDER — ALPRAZOLAM 0.5 MG PO TABS: 0.5000 mg | ORAL_TABLET | Freq: Every evening | ORAL | Status: DC | PRN

## 2017-08-18 MED ORDER — SODIUM CHLORIDE 0.9 % IV SOLN
3.0000 g | Freq: Four times a day (QID) | INTRAVENOUS | Status: DC
  Administered 2017-08-18 – 2017-08-19 (×4): 3 g via INTRAVENOUS
  Filled 2017-08-18 (×7): qty 3

## 2017-08-18 MED ORDER — WARFARIN SODIUM 5 MG PO TABS
5.0000 mg | ORAL_TABLET | ORAL | Status: DC
  Administered 2017-08-18: 5 mg via ORAL
  Filled 2017-08-18: qty 1

## 2017-08-18 MED ORDER — VANCOMYCIN HCL IN DEXTROSE 1-5 GM/200ML-% IV SOLN
1000.0000 mg | Freq: Once | INTRAVENOUS | Status: AC
  Administered 2017-08-18: 1000 mg via INTRAVENOUS
  Filled 2017-08-18: qty 200

## 2017-08-18 MED ORDER — ACETAMINOPHEN 500 MG PO TABS
1000.0000 mg | ORAL_TABLET | Freq: Once | ORAL | Status: AC
  Administered 2017-08-18: 1000 mg via ORAL
  Filled 2017-08-18: qty 2

## 2017-08-18 MED ORDER — CARBIDOPA-LEVODOPA 25-250 MG PO TABS
1.0000 | ORAL_TABLET | Freq: Three times a day (TID) | ORAL | Status: DC
  Administered 2017-08-18 – 2017-08-20 (×7): 1 via ORAL
  Filled 2017-08-18 (×10): qty 1

## 2017-08-18 MED ORDER — WARFARIN - PHARMACIST DOSING INPATIENT: Freq: Every day | Status: DC

## 2017-08-18 MED ORDER — TRAZODONE HCL 50 MG PO TABS
50.0000 mg | ORAL_TABLET | Freq: Every day | ORAL | Status: DC
  Administered 2017-08-18 – 2017-08-19 (×2): 50 mg via ORAL
  Filled 2017-08-18 (×2): qty 1

## 2017-08-18 MED ORDER — ORAL CARE MOUTH RINSE
15.0000 mL | Freq: Two times a day (BID) | OROMUCOSAL | Status: DC
  Administered 2017-08-18 – 2017-08-20 (×3): 15 mL via OROMUCOSAL

## 2017-08-18 MED ORDER — ONDANSETRON HCL 4 MG/2ML IJ SOLN: 4.0000 mg | Freq: Four times a day (QID) | INTRAMUSCULAR | Status: DC | PRN

## 2017-08-18 MED ORDER — ONDANSETRON HCL 4 MG PO TABS: 4.0000 mg | ORAL_TABLET | Freq: Four times a day (QID) | ORAL | Status: DC | PRN

## 2017-08-18 NOTE — Consult Note (Signed)
ANTICOAGULATION CONSULT NOTE - Initial Consult  Pharmacy Consult for Warfarin Dosing  Indication: atrial fibrillation, DVT, stroke and VTE prophylaxis  Patient Measurements: Weight: 145 lb (65.8 kg)   Vital Signs: Temp: 98.5 F (36.9 C) (06/05 0341) Temp Source: Axillary (06/05 0341) BP: 112/60 (06/05 0341) Pulse Rate: 67 (06/05 0341)  Labs: Recent Labs    08/17/17 0003  HGB 14.5  HCT 42.1  PLT 170  LABPROT 29.7*  INR 2.85  CREATININE 1.32*  TROPONINI 0.05*    Estimated Creatinine Clearance: 31.7 mL/min (A) (by C-G formula based on SCr of 1.32 mg/dL (H)).   Medical History: Past Medical History:  Diagnosis Date  . CAD (coronary artery disease)    a.2003 s/p CABG x3 in [place]; b. ? 2014 PCI (records not available).  . Chronic diastolic CHF (congestive heart failure) (HCC)    a. 06/2015 Echo: EF 60-65%, mod dil LA, nl RV, mild to mod TR, PASP 46mmHg.  Marland Kitchen. DVT (deep venous thrombosis) (HCC)   . DVT of leg (deep venous thrombosis) (HCC)   . Dysrhythmia   . H/O: CVA (cerebrovascular accident)   . HOH (hard of hearing)   . Hyperlipidemia   . Hypertension   . Hypertensive heart disease   . Parkinson disease (HCC)   . Permanent atrial fibrillation (HCC)    a. CHA2DS2VASc= 7-->coumadin (followed by primary care).  . Pneumonia    4/17  . Stroke [facility](HCC)     Assessment: Pharmacy consulted for warfarin dosing and monitoring in 84 yo male with PMH of  atrial fibrillation, DVT, and stroke. INR on admission was therapeutic at 2.85. Patient is receiving Abx for PNA.   Home Regimen: Warfarin 5mg : Sun,Tues, Weds, Thurs, Sat                             Warfarin 2.5mg : Mon, Fri  DATE INR DOSE 6/5 2.85 5mg   Goal of Therapy:  INR 2-3 Monitor platelets by anticoagulation protocol: Yes   Plan:   Will continue patient's home regimen for now. Warfarin 5mg  ordered to be given this evening. Will monitor INRs daily per protocol while patient is on antibiotics. INR ordered with AM  labs.   Gardner CandleSheema M Jerris Keltz, PharmD, BCPS Clinical Pharmacist 08/18/2017 9:39 AM

## 2017-08-18 NOTE — Progress Notes (Signed)
Patient seen in the room, family present and grandson was able to interpret. -Patient with dementia, Parkinson's disease, A. fib on warfarin, hypertension presents to hospital secondary to weakness and high fevers. For more details please look at the history and physical by Dr. Sheryle Hailiamond. -Still spiking fevers today.  Cultures are pending.  Chest x-ray consistent with possible pneumonia, aspiration cannot be ruled out. -CT of the head ordered for weakness. -Respiratory panel ordered to rule out viral infections. -Unasyn added instead of vancomycin and cefepime. -Speech therapy consult. -Physical therapy consulted.  Anticipate discharge tomorrow with home health -At baseline patient is alert, keeps his eyes closed all the time.  Family feels like his mentation is at baseline now.

## 2017-08-18 NOTE — ED Notes (Signed)
Spoke with Tyron RussellYeni in the lab who stated they received enough urine to run the C and S but not the UA. EDP aware. Will attempt to collect larger urine specimen.

## 2017-08-18 NOTE — ED Notes (Signed)
Called primary RN Vernona RiegerLaura and informed her patient room. Discussed temperature and line/labs needed.

## 2017-08-18 NOTE — Consult Note (Signed)
Pharmacy Antibiotic Note  Adam Roberson is a 84 y.o. male admitted on 08/17/2017 with pneumonia.  Pharmacy has been consulted for Unasyn dosing.  Plan: Start Unasyn 3g IV every 6 hours. Will monitor renal function and adjust dose as needed.  Weight: 145 lb (65.8 kg)  Temp (24hrs), Avg:100.8 F (38.2 C), Min:98.5 F (36.9 C), Max:103.7 F (39.8 C)  Recent Labs  Lab 08/17/17 0003  WBC 11.1*  CREATININE 1.32*  LATICACIDVEN 1.9    Estimated Creatinine Clearance: 31.7 mL/min (A) (by C-G formula based on SCr of 1.32 mg/dL (H)).    No Known Allergies  Antimicrobials this admission: 6/5 Vancomycin  >> 6/5 6/5 Cefepime >> 6/5 6/5 Unasyn >>    Microbiology results: 6/5  BCx: pending 6/5 UCx: pending 6/5 MRSA PCR: pending   Thank you for allowing pharmacy to be a part of this patient's care.  Gardner CandleSheema M Analeah Brame, PharmD, BCPS Clinical Pharmacist 08/18/2017 12:19 PM

## 2017-08-18 NOTE — Progress Notes (Signed)
Pharmacy Antibiotic Note  Eusebio MeMohammad Ali Gancarz is a 84 y.o. male admitted on 08/17/2017 with sepsis.  Pharmacy has been consulted for vanc/cefepime dosing.  Plan: Patient received vanc 1g and cefepime 2g IV x 1 Will continue vanc 1g IV q12h w/ 8 hour stack Will draw vanc trough 06/06 @ 2000 prior to 4th dose. Will continue cefepime 2g IV daily  Ke 0.0307 T1/2 24 hours Goal trough 15 - 20 mcg/mL   Weight: 145 lb (65.8 kg)  Temp (24hrs), Avg:101.9 F (38.8 C), Min:100.1 F (37.8 C), Max:103.7 F (39.8 C)  Recent Labs  Lab 08/17/17 0003  WBC 11.1*  CREATININE 1.32*  LATICACIDVEN 1.9    Estimated Creatinine Clearance: 31.7 mL/min (A) (by C-G formula based on SCr of 1.32 mg/dL (H)).    No Known Allergies  Thank you for allowing pharmacy to be a part of this patient's care.  Thomasene Rippleavid Evvie Behrmann, PharmD, BCPS Clinical Pharmacist 08/18/2017

## 2017-08-18 NOTE — Plan of Care (Signed)
Pt faces language and cognitive barriers. Pt family helpful. Medication to be given whole in applesauce. Pt responds best when family directs

## 2017-08-18 NOTE — H&P (Signed)
Adam Roberson is an 84 y.o. male.   Chief Complaint: Altered mental status HPI: The patient with past medical history of CAD status post PCI, chronic diastolic CHF, atrial fibrillation, hypertension, Parkinson's disease and stroke presents to the emergency department with altered mental status and weakness.  The patient has appeared to be febrile and shivering.  In the emergency department temperature was found to be 103.7 F.  Patient met criteria for sepsis which prompted the emergency department staff to obtain blood cultures and start broad-spectrum antibiotics.  Once the patient was stabilized emergency department staff call the hospitalist service for admission.  Past Medical History:  Diagnosis Date  . CAD (coronary artery disease)    a.2003 s/p CABG x3 in [place]; b. ? 2014 PCI (records not available).  . Chronic diastolic CHF (congestive heart failure) ([place])    a. 06/2015 Echo: EF 60-65%, mod dil LA, nl RV, mild to mod TR, PASP 41mHg.  .Marland KitchenDVT (deep venous thrombosis) ([place]   . DVT of leg (deep venous thrombosis) ([place]   . Dysrhythmia   . H/O: CVA (cerebrovascular accident)   . HOH (hard of hearing)   . Hyperlipidemia   . Hypertension   . Hypertensive heart disease   . Parkinson disease ([place]   . Permanent atrial fibrillation (HCC)    a. CHA2DS2VASc= 7-->coumadin (followed by primary care).  . Pneumonia    4/17  . Stroke ([facility]     Past Surgical History:  Procedure Laterality Date  . CARDIAC SURGERY  age 84  bypass  . CATARACT EXTRACTION W/PHACO Left 08/27/2015   Procedure: CATARACT EXTRACTION PHACO AND INTRAOCULAR LENS PLACEMENT (IOC);  Surgeon: CLeandrew Koyanagi MD;  Location: [facility] ORS;  Service: Ophthalmology;  Laterality: Left;  [place]:56 AP% 11.6 CDE 6.45 fluid pack lot # [id number]  . CATARACT EXTRACTION W/PHACO Right 11/05/2015   Procedure: CATARACT EXTRACTION PHACO AND INTRAOCULAR LENS PLACEMENT (IOC);  Surgeon: CLeandrew Koyanagi MD;  Location: [facility] ORS;   Service: Ophthalmology;  Laterality: Right;  [place]01:06 AP% 14.3 CDE 9.46 Fluid Pack lot # [id number]  . CORONARY ARTERY BYPASS GRAFT    . dvt    . EYE SURGERY    . LEG SURGERY Right     Family History  Problem Relation Age of Onset  . CAD Mother    Social History:  reports that he has quit smoking. He has never used smokeless tobacco. He reports that he does not drink alcohol or use drugs.  Allergies: No Known Allergies  Medications Prior to Admission  Medication Sig Dispense Refill  . ALPRAZolam (XANAX) 0.5 MG tablet Take 0.5 mg by mouth at bedtime as needed for anxiety. Takes 3 times weekly.    .Marland Kitchenatorvastatin (LIPITOR) 20 MG tablet Take 1 tablet (20 mg total) by mouth daily. 90 tablet 3  . carbidopa-levodopa (SINEMET IR) 25-250 MG tablet Take one tablet by mouth three times a day. 90 tablet 3  . diltiazem (CARDIZEM CD) 120 MG 24 hr capsule Take 1 capsule (120 mg total) by mouth daily. 90 capsule 3  . furosemide (LASIX) 20 MG tablet Take 1 tablet (20 mg total) by mouth as needed. (Patient not taking: Reported on 08/11/2017) 90 tablet 3  . furosemide (LASIX) 40 MG tablet Take 1 tablet (40 mg total) by mouth daily. 90 tablet 3  . metoprolol tartrate (LOPRESSOR) 50 MG tablet Take 1 tablet (50 mg total) by mouth 2 (two) times daily. Takes 1/2 tab BID 90 tablet 3  .  risperiDONE (RISPERDAL) 1 MG tablet Take 1 tablet (1 mg total) by mouth 2 (two) times daily. 60 tablet 5  . traZODone (DESYREL) 50 MG tablet Take 1 tablet (50 mg total) by mouth at bedtime. Patient taking 1/2 tablet in the morning and 1/2 in the evening 30 tablet 5  . warfarin (COUMADIN) 5 MG tablet Take 1/2 Tablet on Monday and Friday. Take 1 Tabket on Tuesday, Wednesday, Thursday, Saturday and Sunday or as directed. 30 tablet 12    Results for orders placed or performed during the hospital encounter of 08/17/17 (from the past 48 hour(s))  Comprehensive metabolic panel     Status: Abnormal   Collection Time: 08/17/17 12:03 AM   Result Value Ref Range   Sodium 134 (L) 135 - 145 mmol/L   Potassium 3.9 3.5 - 5.1 mmol/L   Chloride 101 101 - 111 mmol/L   CO2 23 22 - 32 mmol/L   Glucose, Bld 160 (H) 65 - 99 mg/dL   BUN 17 6 - 20 mg/dL   Creatinine, Ser 1.32 (H) 0.61 - 1.24 mg/dL   Calcium 8.5 (L) 8.9 - 10.3 mg/dL   Total Protein 7.1 6.5 - 8.1 g/dL   Albumin 3.4 (L) 3.5 - 5.0 g/dL   AST 54 (H) 15 - 41 U/L   ALT 9 (L) 17 - 63 U/L   Alkaline Phosphatase 85 38 - 126 U/L   Total Bilirubin 1.5 (H) 0.3 - 1.2 mg/dL   GFR calc non Af Amer 47 (L) >60 mL/min   GFR calc Af Amer 54 (L) >60 mL/min    Comment: (NOTE) The eGFR has been calculated using the CKD EPI equation. This calculation has not been validated in all clinical situations. eGFR's persistently <60 mL/min signify possible Chronic Kidney Disease.    Anion gap 10 5 - 15    Comment: Performed at [facility], [address] [place], [place] [address]  Lactic acid, plasma     Status: None   Collection Time: 08/17/17 12:03 AM  Result Value Ref Range   Lactic Acid, Venous 1.9 0.5 - 1.9 mmol/L    Comment: Performed at [facility], [address] [place], [place] [address]  CBC with Differential     Status: Abnormal   Collection Time: 08/17/17 12:03 AM  Result Value Ref Range   WBC 11.1 (H) 3.8 - 10.6 K/uL   RBC 4.70 4.40 - 5.90 MIL/uL   Hemoglobin 14.5 13.0 - 18.0 g/dL   HCT 42.1 40.0 - 52.0 %   MCV 89.7 80.0 - 100.0 fL   MCH 31.0 26.0 - 34.0 pg   MCHC 34.5 32.0 - 36.0 g/dL   RDW 13.9 11.5 - 14.5 %   Platelets 170 150 - 440 K/uL   Neutrophils Relative % 89 %   Neutro Abs 9.9 (H) 1.4 - 6.5 K/uL   Lymphocytes Relative 3 %   Lymphs Abs 0.4 (L) 1.0 - 3.6 K/uL   Monocytes Relative 8 %   Monocytes Absolute 0.8 0.2 - 1.0 K/uL   Eosinophils Relative 0 %   Eosinophils Absolute 0.0 0 - 0.7 K/uL   Basophils Relative 0 %   Basophils Absolute 0.0 0 - 0.1 K/uL    Comment: Performed at [facility], [address]  [place],  [address]  Protime-INR     Status: Abnormal   Collection Time: 08/17/17 12:03 AM  Result Value Ref Range   Prothrombin Time 29.7 (H) 11.4 - 15.2 seconds  INR 2.85     Comment: Performed at [facility], [address] [place], [place] [address]  Lipase, blood     Status: None   Collection Time: 08/17/17 12:03 AM  Result Value Ref Range   Lipase 34 11 - 51 U/L    Comment: Performed at [facility], [address] [place], [place] [address]  Brain natriuretic peptide - IF patient is dyspneic     Status: Abnormal   Collection Time: 08/17/17 12:03 AM  Result Value Ref Range   B Natriuretic Peptide 242.0 (H) 0.0 - 100.0 pg/mL    Comment: Performed at [facility], [address] [place], [place] [address]  Troponin I     Status: Abnormal   Collection Time: 08/17/17 12:03 AM  Result Value Ref Range   Troponin I 0.05 (HH) <0.03 ng/mL    Comment: CRITICAL RESULT CALLED TO, READ BACK BY AND VERIFIED WITH SHANNON MARTIN '@0210'$  08/19/14 [facility] Performed at [facility], [address] [place], [place] [address]   Procalcitonin     Status: None   Collection Time: 08/17/17 12:03 AM  Result Value Ref Range   Procalcitonin <0.10 ng/mL    Comment:        Interpretation: PCT (Procalcitonin) <= 0.5 ng/mL: Systemic infection (sepsis) is not likely. Local bacterial infection is possible. (NOTE)       Sepsis PCT Algorithm           Lower Respiratory Tract                                      Infection PCT Algorithm    ----------------------------     ----------------------------         PCT < 0.25 ng/mL                PCT < 0.10 ng/mL         Strongly encourage             Strongly discourage   discontinuation of antibiotics    initiation of antibiotics    ----------------------------     -----------------------------       PCT 0.25 - 0.50 ng/mL            PCT 0.10 - 0.25 ng/mL               OR       >80% decrease in PCT            Discourage  initiation of                                            antibiotics      Encourage discontinuation           of antibiotics    ----------------------------     -----------------------------         PCT >= 0.50 ng/mL              PCT 0.26 - 0.50 ng/mL               AND        <80% decrease in PCT             Encourage initiation of  antibiotics       Encourage continuation           of antibiotics    ----------------------------     -----------------------------        PCT >= 0.50 ng/mL                  PCT > 0.50 ng/mL               AND         increase in PCT                  Strongly encourage                                      initiation of antibiotics    Strongly encourage escalation           of antibiotics                                     -----------------------------                                           PCT <= 0.25 ng/mL                                                 OR                                        > 80% decrease in PCT                                     Discontinue / Do not initiate                                             antibiotics Performed at [facility], [address] [place], [place] [address]   TSH     Status: None   Collection Time: 08/18/17 12:03 AM  Result Value Ref Range   TSH 1.608 0.350 - 4.500 uIU/mL    Comment: Performed by a 3rd Generation assay with a functional sensitivity of <=0.01 uIU/mL. Performed at [facility], [address] [place], [place] [address]    Dg Chest Portable 1 View  Result Date: 08/18/2017 CLINICAL DATA:  Acute onset of fever, generalized weakness and altered mental status. EXAM: PORTABLE CHEST 1 VIEW COMPARISON:  Chest radiograph performed 05/23/2017 FINDINGS: The lungs are well-aerated. A small right pleural effusion is noted. Hazy left-sided peripheral opacity could reflect pneumonia or interstitial edema. There is no evidence of  pneumothorax. The cardiomediastinal silhouette is borderline normal in size. The patient is status post median sternotomy. No acute osseous abnormalities are seen. IMPRESSION: Small right pleural effusion noted. Hazy left-sided peripheral opacity could reflect pneumonia or interstitial edema. Electronically  Signed   By: Garald Balding M.D.   On: 08/18/2017 00:30    Review of Systems  Unable to perform ROS: Mental status change    Blood pressure 112/60, pulse 67, temperature 98.5 F (36.9 C), temperature source Axillary, resp. rate (!) 28, weight 65.8 kg (145 lb), SpO2 99 %. Physical Exam  Constitutional: He appears well-developed and well-nourished. No distress.  HENT:  Head: Normocephalic and atraumatic.  Mouth/Throat: Oropharynx is clear and moist.  Eyes: Pupils are equal, round, and reactive to light. Conjunctivae and EOM are normal. No scleral icterus.  Neck: Normal range of motion. Neck supple. No JVD present. No tracheal deviation present. No thyromegaly present.  Cardiovascular: Normal rate, regular rhythm and normal heart sounds. Exam reveals no gallop and no friction rub.  No murmur heard. Respiratory: Effort normal and breath sounds normal. No respiratory distress.  GI: Soft. Bowel sounds are normal. He exhibits no distension.  Genitourinary:  Genitourinary Comments: Deferred  Musculoskeletal: Normal range of motion. He exhibits no edema.  Lymphadenopathy:    He has no cervical adenopathy.  Neurological: He is alert.  Skin: Skin is warm and dry. No rash noted. No erythema.  Psychiatric:  Confusion     Assessment/Plan This is an 84 year old male admitted for pneumonia. 1.  HCAP: Started on cefepime and vancomycin.  No hypoxia.  Kidney function acutely worsened.  Supplemental oxygen as needed. 2.  Sepsis: The patient meets criteria via fever, tachycardia and tachypnea.  He is hemodynamically stable.  Follow blood cultures for growth and sensitivities. 3.  Acute kidney  injury: Nephrotoxic agents.  Hydrate with intravenous fluid. 4.  Essential hypertension: Controlled; continue metoprolol 5.  Atrial fibrillation: Rate controlled; continue warfarin when dose verified.  (The patient may be on diltiazem; resume when home meds verified) 6.  CHF: Chronic; diastolic.  Continue Lasix per home regimen 7.  Parkinson's disease: Continue Sinemet and Risperdal  8.  Hyperlipidemia: Continue statin therapy 9.  DVT prophylaxis: Full dose anticoagulation 10.  GI prophylaxis: None The patient is a full code.  Time spent on admission orders and patient care approximately 45 minutes  Harrie Foreman, MD 08/18/2017, 4:57 AM

## 2017-08-18 NOTE — Evaluation (Addendum)
Clinical/Bedside Swallow Evaluation Patient Details  Name: Adam Roberson MRN: [id number] Date of Birth: 02-28-1929  Today's Date: 08/18/2017 Time: SLP Start Time (ACUTE ONLY): 0900 SLP Stop Time (ACUTE ONLY): 1000 SLP Time Calculation (min) (ACUTE ONLY): 60 min  Past Medical History:  Past Medical History:  Diagnosis Date  . CAD (coronary artery disease)    a.2003 s/p CABG x3 in [place]; b. ? 2014 PCI (records not available).  . Chronic diastolic CHF (congestive heart failure) ([place])    a. 06/2015 Echo: EF 60-65%, mod dil LA, nl RV, mild to mod TR, PASP 35mHg.  .Marland KitchenDVT (deep venous thrombosis) ([place]   . DVT of [place] (deep venous thrombosis) ([place]   . Dysrhythmia   . H/O: CVA (cerebrovascular accident)   . HOH (hard of hearing)   . Hyperlipidemia   . Hypertension   . Hypertensive heart disease   . Parkinson disease ([place]   . Permanent atrial fibrillation (HCC)    a. CHA2DS2VASc= 7-->coumadin (followed by primary care).  . Pneumonia    4/17  . Stroke ([facility]    Past Surgical History:  Past Surgical History:  Procedure Laterality Date  . CARDIAC SURGERY  age 84  bypass  . CATARACT EXTRACTION W/PHACO Left 08/27/2015   Procedure: CATARACT EXTRACTION PHACO AND INTRAOCULAR LENS PLACEMENT (IOC);  Surgeon: CLeandrew Koyanagi MD;  Location: [facility] ORS;  Service: Ophthalmology;  Laterality: Left;  [place]:56 AP% 11.6 CDE 6.45 fluid pack lot # [id number]  . CATARACT EXTRACTION W/PHACO Right 11/05/2015   Procedure: CATARACT EXTRACTION PHACO AND INTRAOCULAR LENS PLACEMENT (IOC);  Surgeon: CLeandrew Koyanagi MD;  Location: [facility] ORS;  Service: Ophthalmology;  Laterality: Right;  [place]01:06 AP% 14.3 CDE 9.46 Fluid Pack lot # [id number]  . CORONARY ARTERY BYPASS GRAFT    . dvt    . EYE SURGERY    . LEG SURGERY Right    HPI:  Pt is a 84y/o male w/ past medical history of Dementia, CAD status post PCI, chronic diastolic CHF, atrial fibrillation, hypertension, Parkinson's disease, stroke, DVT who  presents to the emergency department with altered mental status and weakness.  The patient has appeared to be febrile and shivering.  In the emergency department temperature was found to be 103.7 F.  Patient met criteria for sepsis which prompted the emergency department staff to obtain blood cultures and start broad-spectrum antibiotics. Family indicated pt's current presentation was close to/at his baseline at home - intermittent verbal responses, eyes closed often, needing help w/ feeding, cues to follow commands.    Assessment / Plan / Recommendation Clinical Impression  Pt appears to present w/ grossly adequate oropharyngeal phase swallow function w/ reduced risk for aspiration when following aspiration precautions during oral intake - full feeding assistance and monitoring w/ all po's as well. Pt has Dementia and Parkinson's Dis. at baseline which can increase risk for dysphagia; he also requires assistance w/ feeding. Pt was positioned upright and given trials of thin liquids, purees and softened solids w/ full assistance - pt was able to help hold the cup to drink w/ cues given. Pt consumed trials w/ no overt s/s of aspiration noted, no decline in vocal quality or respiratory status during/post trials. Pt's oral phase was grossly wfl w/ most trials - min lip smacking and gumming motions noted post each trial given suspect related to his baseline Cognitive decline. W/ trials of increased texture(soft solids), pt exhibited min increased mastication time, prolonged oral phase time - impacted by lacking  bottom dentition. Family indicated they offered "soups' and rice at home for pt. Pt required assistance w/ all feeding. With family agreement, recommend a dysphagia level 2 (MINCED foods) diet w/ Thin liquids; aspiration precautions; feeding support at all meals. Pills WHOLE in puree (which he tolerated well w/ NSG present during this eval). When pt returns home, family can provide the foods he is used to in  a consistency he is used to - education given on food consistencies and ways to modify them. Precautions post. NSG to reconsult if any new needs while admitted.  Of note, family indicated that pt was at his baseline from a communication standpoint. MD/NSG informed. Handouts given on dysphagia level 2(MINCED foods) diet and precautions.  SLP Visit Diagnosis: Dysphagia, oral phase (R13.11)(Dementia, lacks bottom dentition)    Aspiration Risk  (reduced following aspiration precautions)    Diet Recommendation  Dysphagia level 2 (MINCED Foods) moistened; Thin liquids. Aspiration precautions; feeding support at meals  Medication Administration: Whole meds with puree(for safer clearing, swallowing)    Other  Recommendations Recommended Consults: (Dietician f/u as needed) Oral Care Recommendations: Oral care BID;Staff/trained caregiver to provide oral care Other Recommendations: (n/a)   Follow up Recommendations None      Frequency and Duration (n/a)  (n/a)       Prognosis Prognosis for Safe Diet Advancement: Fair(-Good) Barriers to Reach Goals: Cognitive deficits;Severity of deficits;Time post onset      Swallow Study   General Date of Onset: 08/17/17 HPI: Pt is a 84 y/o male w/ past medical history of Dementia, CAD status post PCI, chronic diastolic CHF, atrial fibrillation, hypertension, Parkinson's disease, stroke, DVT who presents to the emergency department with altered mental status and weakness.  The patient has appeared to be febrile and shivering.  In the emergency department temperature was found to be 103.7 F.  Patient met criteria for sepsis which prompted the emergency department staff to obtain blood cultures and start broad-spectrum antibiotics. Family indicated pt's current presentation was close to/at his baseline at home - intermittent verbal responses, eyes closed often, needing help w/ feeding, cues to follow commands.  Type of Study: Bedside Swallow Evaluation Previous  Swallow Assessment: none Diet Prior to this Study: Dysphagia 3 (soft);Thin liquids("soft foods cut well") Temperature Spikes Noted: Yes(at admission; wbc 11.1) Respiratory Status: Nasal cannula(2 liters) History of Recent Intubation: No Behavior/Cognition: Cooperative;Pleasant mood;Confused;Distractible;Requires cueing;Doesn't follow directions(eyes closed often) Oral Cavity Assessment: Within Functional Limits Oral Care Completed by SLP: Recent completion by staff Oral Cavity - Dentition: Dentures, top(no bottom dentition) Vision: (eyes closed) Self-Feeding Abilities: Needs assist;Needs set up;Total assist Patient Positioning: Upright in bed(needed help w/ positioning - legs drawn up) Baseline Vocal Quality: Low vocal intensity(muttered speech) Volitional Cough: Cognitively unable to elicit Volitional Swallow: Unable to elicit    Oral/Motor/Sensory Function Overall Oral Motor/Sensory Function: (appeared [facility] w/ bolus management and oral clearing)   Ice Chips Ice chips: Within functional limits Presentation: Spoon(fed; 2 trials)   Thin Liquid Thin Liquid: Within functional limits(grossly; min lip smacking and gumming motions) Presentation: Cup;Self Fed(fully assisted w/ cup holding; ~2-3 ozs)    Nectar Thick Nectar Thick Liquid: Not tested   Honey Thick Honey Thick Liquid: Not tested   Puree Puree: Within functional limits(grossly; min lip smacking and gumming motions) Presentation: Spoon(fed; 4 ozs)   Solid   GO   Solid: Impaired Presentation: Spoon(fed; 3 trials) Oral Phase Impairments: Impaired mastication(increased time - lacking bottom dentition) Oral Phase Functional Implications: Impaired mastication;Prolonged oral transit(lacking bottom dentition) Pharyngeal  Phase Impairments: (none)        Orinda Kenner, MS, CCC-SLP Rafaella Kole 08/18/2017,11:57 AM

## 2017-08-18 NOTE — Progress Notes (Signed)
CODE SEPSIS - PHARMACY COMMUNICATION  **Broad Spectrum Antibiotics should be administered within 1 hour of Sepsis diagnosis**  Time Code Sepsis Called/Page Received: 2356  Antibiotics Ordered: vanc/cefepime  Time of 1st antibiotic administration: 0040  Additional action taken by pharmacy:   If necessary, Name of Provider/Nurse Contacted:     Thomasene Rippleavid  Lucus Lambertson ,PharmD Clinical Pharmacist  08/18/2017  1:46 AM

## 2017-08-19 ENCOUNTER — Inpatient Hospital Stay: Payer: Medicaid Other

## 2017-08-19 LAB — RESPIRATORY PANEL BY PCR
ADENOVIRUS-RVPPCR: NOT DETECTED
Bordetella pertussis: NOT DETECTED
CORONAVIRUS 229E-RVPPCR: NOT DETECTED
CORONAVIRUS HKU1-RVPPCR: NOT DETECTED
CORONAVIRUS NL63-RVPPCR: NOT DETECTED
CORONAVIRUS OC43-RVPPCR: NOT DETECTED
Chlamydophila pneumoniae: NOT DETECTED
Influenza A: NOT DETECTED
Influenza B: NOT DETECTED
METAPNEUMOVIRUS-RVPPCR: NOT DETECTED
MYCOPLASMA PNEUMONIAE-RVPPCR: NOT DETECTED
PARAINFLUENZA VIRUS 1-RVPPCR: NOT DETECTED
PARAINFLUENZA VIRUS 2-RVPPCR: NOT DETECTED
Parainfluenza Virus 3: NOT DETECTED
Parainfluenza Virus 4: NOT DETECTED
RESPIRATORY SYNCYTIAL VIRUS-RVPPCR: NOT DETECTED
Rhinovirus / Enterovirus: NOT DETECTED

## 2017-08-19 LAB — BASIC METABOLIC PANEL
ANION GAP: 7 (ref 5–15)
BUN: 10 mg/dL (ref 6–20)
CO2: 24 mmol/L (ref 22–32)
Calcium: 7.6 mg/dL — ABNORMAL LOW (ref 8.9–10.3)
Chloride: 109 mmol/L (ref 101–111)
Creatinine, Ser: 0.66 mg/dL (ref 0.61–1.24)
GFR calc Af Amer: >60 mL/min (ref >60)
GLUCOSE: 114 mg/dL — AB (ref 65–99)
Potassium: 3.5 mmol/L (ref 3.5–5.1)
Sodium: 140 mmol/L (ref 135–145)

## 2017-08-19 LAB — URINE CULTURE: Culture: NO GROWTH

## 2017-08-19 LAB — CBC
HCT: 38.9 % — ABNORMAL LOW (ref 40.0–52.0)
HEMOGLOBIN: 13.4 g/dL (ref 13.0–18.0)
MCH: 31.2 pg (ref 26.0–34.0)
MCHC: 34.3 g/dL (ref 32.0–36.0)
MCV: 90.9 fL (ref 80.0–100.0)
PLATELETS: 151 10*3/uL (ref 150–440)
RBC: 4.28 MIL/uL — AB (ref 4.40–5.90)
RDW: 14.3 % (ref 11.5–14.5)
WBC: 8.3 10*3/uL (ref 3.8–10.6)

## 2017-08-19 LAB — PROTIME-INR
INR: 4.05
Prothrombin Time: 39.1 seconds — ABNORMAL HIGH (ref 11.4–15.2)

## 2017-08-19 MED ORDER — AMOXICILLIN-POT CLAVULANATE 875-125 MG PO TABS
1.0000 | ORAL_TABLET | Freq: Two times a day (BID) | ORAL | Status: DC
  Administered 2017-08-19 – 2017-08-20 (×3): 1 via ORAL
  Filled 2017-08-19 (×5): qty 1

## 2017-08-19 NOTE — Progress Notes (Signed)
CRITICAL VALUE ALERT  Critical Value:  4.05 INR  Date & Time Notied:  08/19/2017 8:06 am  Provider Notified: Enedina FinnerSona Patel  Orders Received/Actions taken: MD notified face to face. Pharmacist notified warfarin discontinued at this time. Pharmacy will continue to follow

## 2017-08-19 NOTE — Progress Notes (Signed)
[facility] [place] [facility]   PATIENT NAME: Adam Roberson    MR#:  [id number]  DATE OF BIRTH:  Nov 05, 1928  SUBJECTIVE:  patient does not verbalize due to language. Daughter in the room. Complains of left thigh pain. No fever.  REVIEW OF SYSTEMS:   Review of Systems  Unable to perform ROS: Mental acuity   Tolerating Diet:yes Tolerating PT: pt is bedbound  DRUG ALLERGIES:  No Known Allergies  VITALS:  Blood pressure (!) 145/76, pulse (!) 107, temperature 98.3 F (36.8 C), temperature source Oral, resp. rate 18, weight 66.4 kg (146 lb 6.4 oz), SpO2 96 %.  PHYSICAL EXAMINATION:   Physical Exam  GENERAL:  84 y.o.-year-old patient lying in the bed with no acute distress.  EYES: Pupils equal, round, reactive to light and accommodation. No scleral icterus. Extraocular muscles intact.  HEENT: Head atraumatic, normocephalic. Oropharynx and nasopharynx clear.  NECK:  Supple, no jugular venous distention. No thyroid enlargement, no tenderness.  LUNGS: Normal breath sounds bilaterally, no wheezing, rales, rhonchi. No use of accessory muscles of respiration.  CARDIOVASCULAR: S1, S2 normal. No murmurs, rubs, or gallops.  ABDOMEN: Soft, nontender, nondistended. Bowel sounds present. No organomegaly or mass.  EXTREMITIES: some tenderness over the left thigh. No signs of cellulitis. No skin breakdown. No soft tissue swelling noted.    NEUROLOGIC: moves all extremities well PSYCHIATRIC:  patient is alert but nonverbal due to language barrier SKIN: No obvious rash, lesion, or ulcer.   LABORATORY PANEL:  CBC Recent Labs  Lab 08/19/17 0453  WBC 8.3  HGB 13.4  HCT 38.9*  PLT 151    Chemistries  Recent Labs  Lab 08/17/17 0003 08/19/17 0453  NA 134* 140  K 3.9 3.5  CL 101 109  CO2 23 24  GLUCOSE 160* 114*  BUN 17 10  CREATININE 1.32* 0.66  CALCIUM 8.5* 7.6*  AST 54*  --   ALT 9*  --   ALKPHOS 85  --   BILITOT 1.5*  --    Cardiac  Enzymes Recent Labs  Lab 08/17/17 0003  TROPONINI 0.05*   RADIOLOGY:  Ct Head Wo Contrast  Result Date: 08/18/2017 CLINICAL DATA:  84 year old male with altered mental status. EXAM: CT HEAD WITHOUT CONTRAST TECHNIQUE: Contiguous axial images were obtained from the base of the skull through the vertex without intravenous contrast. COMPARISON:  06/28/2015 CT FINDINGS: Brain: No evidence of acute infarction, hemorrhage, hydrocephalus, extra-axial collection or mass lesion/mass effect. Atrophy and chronic small-vessel white matter ischemic changes again noted. Vascular: Atherosclerotic calcifications again identified. Skull: Normal. Negative for fracture or focal lesion. Sinuses/Orbits: No acute finding. Other: None. IMPRESSION: 1. No evidence of acute intracranial abnormality 2. Atrophy and chronic small-vessel white matter ischemic changes. Electronically Signed   By: Harmon PierJeffrey  Hu M.D.   On: 08/18/2017 12:12   Ct Femur Left Wo Contrast  Result Date: 08/19/2017 CLINICAL DATA:  Left lateral hip pain worsening with movement. History of dementia, Parkinson's disease and atrial fibrillation. No known recent injury. EXAM: CT OF THE LOWER LEFT EXTREMITY WITHOUT CONTRAST TECHNIQUE: Multidetector CT imaging of the left thigh was performed according to the standard protocol. COMPARISON:  Left hip radiographs 08/19/2017. FINDINGS: Bones/Joint/Cartilage Images extend from the left hip through the left knee. The left femur appears normal without evidence of acute fracture or dislocation. There is no focal osseous abnormality. Mild degenerative changes are present at the left hip and knee. No significant joint effusions. Ligaments Suboptimally assessed by CT. Muscles and  Tendons The left thigh muscles and tendons appear normal. The extensor mechanism is intact at the knee. Soft tissues Mild subcutaneous edema posterolaterally in the proximal left thigh, extending into the buttock. No focal fluid collection identified.  Iliofemoral atherosclerosis noted. IMPRESSION: 1. The left femur appears normal without acute findings. Mild left hip and knee degenerative changes. 2. Mild subcutaneous edema posterolaterally in the proximal left thigh, possibly cellulitis. Correlate clinically. No focal fluid collection. Electronically Signed   By: Carey Bullocks M.D.   On: 08/19/2017 15:54   Dg Chest Portable 1 View  Result Date: 08/18/2017 CLINICAL DATA:  Acute onset of fever, generalized weakness and altered mental status. EXAM: PORTABLE CHEST 1 VIEW COMPARISON:  Chest radiograph performed 05/23/2017 FINDINGS: The lungs are well-aerated. A small right pleural effusion is noted. Hazy left-sided peripheral opacity could reflect pneumonia or interstitial edema. There is no evidence of pneumothorax. The cardiomediastinal silhouette is borderline normal in size. The patient is status post median sternotomy. No acute osseous abnormalities are seen. IMPRESSION: Small right pleural effusion noted. Hazy left-sided peripheral opacity could reflect pneumonia or interstitial edema. Electronically Signed   By: Roanna Raider M.D.   On: 08/18/2017 00:30   Dg Hip Unilat With Pelvis 2-3 Views Left  Result Date: 08/19/2017 CLINICAL DATA:  Patient with dementia, Parkinson's disease, A. Fib, and hypertension presents to hospital secondary to weakness and high fevers; Pt poor historian; per nurse and family, pt c/o left hip pain; no injury EXAM: DG HIP (WITH OR WITHOUT PELVIS) 2-3V LEFT COMPARISON:  None. FINDINGS: There is no evidence of hip fracture or dislocation. There is no evidence of arthropathy or other focal bone abnormality. IMPRESSION: Negative. Electronically Signed   By: Norva Pavlov M.D.   On: 08/19/2017 10:01   ASSESSMENT AND PLAN:  84 year old male CAD status post PCI, chronic diastolic CHF, atrial fibrillation, hypertension, Parkinson's disease and stroke presents to the emergency department with altered mental status and weakness.   The patient has appeared to be febrile and shivering.  In the emergency department temperature was found to be 103.7 F.    1. Febrile illness with sepsis like presentation -no exact etiology determined. Patient is been empirically treated for possible aspiration pneumonia. Chest x-ray not impressive.  -Change to Augmentin PO BID. Patient tolerating diet well. No fever.  -Wean to room air.   2.  Sepsis: The patient meets criteria via fever, tachycardia and tachypnea.  He is hemodynamically stable. -Blood culture so far negative. White count normalized.  3.  Acute kidney injury: Nephrotoxic agents.  Hydrate with intravenous fluid. -Creatinine back to baseline.  4.   left thigh pain  -x-ray of the pelvis negative for any fracture. -Patient had some amount of left thigh pain on palpation. Clinically no signs of cellulitis. CT of the femur left done showed mild inflammation in the posterior inferior part of the thigh -will continue pain meds PRN along with finish of course of antibiotic with Augmentin. -Explain family to follow up with primary care/orthopedic if symptoms do not improve. Patient does not walk. No fall or any injury at home according to the daughter.  5.  Atrial fibrillation: Rate controlled; continue warfarin when dose verified.  (The patient may be on diltiazem; resume when home meds verified) -INR 4.05. -Will hold Coumadin today and tomorrow. Patient will start taking 2.5 mg from Saturday daily and still he finishes his antibiotic course and then will resume his normal regimen as before. This was explained to the daughter.  6.  CHF: Chronic; diastolic.  Continue Lasix per home regimen  7.  Parkinson's disease: Continue Sinemet and Risperdal  -patient has baseline dementia  8.  Hyperlipidemia: Continue statin therapy  9.  DVT prophylaxis: Full dose anticoagulation   Case discussed with Care Management/Social Worker. Management plans discussed with the patient, family and  they are in agreement.  CODE STATUS: full  DVT Prophylaxis: warfarin  TOTAL TIME TAKING CARE OF THIS PATIENT: 35 minutes.  >50% time spent on counselling and coordination of care  POSSIBLE D/C IN 1-2 DAYS, DEPENDING ON CLINICAL CONDITION.  Note: This dictation was prepared with Dragon dictation along with smaller phrase technology. Any transcriptional errors that result from this process are unintentional.  Enedina Finner M.D on 08/19/2017 at 7:24 PM  Between 7am to 6pm - Pager - [phone]  After 6pm go to [email or web address] - Social research officer, government  [facility]  Office  [phone]  CC: Primary care physician; [place], KernodlePatient ID: Michaeljohn Biss, male   DOB: January 21, 1929, 84 y.o.   MRN: [id number]

## 2017-08-19 NOTE — Consult Note (Signed)
ANTICOAGULATION CONSULT NOTE - Initial Consult  Pharmacy Consult for Warfarin Dosing  Indication: atrial fibrillation, DVT, stroke and VTE prophylaxis  Patient Measurements: Weight: 146 lb 6.4 oz (66.4 kg)   Vital Signs: Temp: 99 F (37.2 C) (06/06 0501) Temp Source: Oral (06/06 0501) BP: 140/80 (06/06 0501) Pulse Rate: 124 (06/06 0501)  Labs: Recent Labs    08/17/17 0003 08/19/17 0453  HGB 14.5 13.4  HCT 42.1 38.9*  PLT 170 151  LABPROT 29.7* 39.1*  INR 2.85 4.05*  CREATININE 1.32* 0.66  TROPONINI 0.05*  --     Estimated Creatinine Clearance: 52.4 mL/min (by C-G formula based on SCr of 0.66 mg/dL).   Medical History: Past Medical History:  Diagnosis Date  . CAD (coronary artery disease)    a.2003 s/p CABG x3 in [place]; b. ? 2014 PCI (records not available).  . Chronic diastolic CHF (congestive heart failure) (HCC)    a. 06/2015 Echo: EF 60-65%, mod dil LA, nl RV, mild to mod TR, PASP 46mmHg.  Marland Kitchen. DVT (deep venous thrombosis) (HCC)   . DVT of leg (deep venous thrombosis) (HCC)   . Dysrhythmia   . H/O: CVA (cerebrovascular accident)   . HOH (hard of hearing)   . Hyperlipidemia   . Hypertension   . Hypertensive heart disease   . Parkinson disease (HCC)   . Permanent atrial fibrillation (HCC)    a. CHA2DS2VASc= 7-->coumadin (followed by primary care).  . Pneumonia    4/17  . Stroke [facility](HCC)     Assessment: Pharmacy consulted for warfarin dosing and monitoring in 84 yo male with PMH of  atrial fibrillation, DVT, and stroke. INR on admission was therapeutic at 2.85. Patient is receiving Abx for PNA.   Home Regimen: Warfarin 5mg : Sun,Tues, Weds, Thurs, Sat                             Warfarin 2.5mg : Mon, Fri  DATE INR DOSE 6/5 2.85 5mg  6/6 4.05 HELD  Goal of Therapy:  INR 2-3 Monitor platelets by anticoagulation protocol: Yes   Plan:   INR supratherapeutic this morning. Will hold warfarin at this time and restart when INR is closer to goal.  Will monitor INRs  daily per protocol while patient is on antibiotics. INR ordered with AM labs.   Gardner CandleSheema M Maher Shon, PharmD, BCPS Clinical Pharmacist 08/19/2017 9:03 AM

## 2017-08-19 NOTE — Progress Notes (Signed)
Speech Therapy note: chart reviewed; f/u w/ NSG then pt'/family re: toleration of the minced foods diet. Family reported good toleration of the foods; almost all of the Lunch meal had been eaten fed to him by family. NSG reported good toleration as well.  ST services will be available for any further education if needed. Handouts on a minced consistency diet were given to family; aspiration precautions.    Jerilynn SomKatherine Claus Silvestro, MS, CCC-SLP

## 2017-08-19 NOTE — Evaluation (Signed)
Physical Therapy Evaluation Patient Details Name: Adam Roberson MRN: [id number] DOB: 1928-08-09 Today's Date: 08/19/2017   History of Present Illness  84 y/o male here with pneumonia.  He has significant history with dementia, Parkinson's Dz, h/o CVA, CAD status post PCI, chronic diastolic CHF, atrial fibrillation, hypertension.  Pt has been very sensitive with L hip movement/mobility, X-rays negative for fx/dislocation.   Clinical Impression  Pt very limited with what he was able to do/participate with during PT session.  He did not open eyes and despite family trying to get him to interact he did very little verbalization.  He did follow instructions as he was able to while trying to assess LE strength and perform light exercises but ultimately he was very functionally limited and did not tolerate even minimal attempts at mobility. Pt lacks insurance and DME and services seems resource prohibitive. PT did encourage getting rails or a hospital bed so that he (with heavy family assist) does not need to get up/down from floor height.    Follow Up Recommendations Home health PT    Equipment Recommendations  Hospital bed    Recommendations for Other Services       Precautions / Restrictions Precautions Precautions: Fall Restrictions Weight Bearing Restrictions: No      Mobility  Bed Mobility Overal bed mobility: Needs Assistance             General bed mobility comments: attempted to try initiating mobility from supine but pt clearly in pain and grabbing L hip, family not overly interested in forcing him to sit up as he is hurting so much.  Deferred further mobility tasks.  Transfers                    Ambulation/Gait                Stairs            Wheelchair Mobility    Modified Rankin (Stroke Patients Only)       Balance                                             Pertinent Vitals/Pain Pain Assessment: Faces Faces  Pain Scale: Hurts whole lot Pain Location: Pt with only minimal look of discomfort at rest, severe pain reactions in L hip with any movement    Home Living Family/patient expects to be discharged to:: Private residence Living Arrangements: Spouse/significant other;Other relatives Available Help at Discharge: Family;Available 24 hours/day         Home Layout: (sleeps on mat on the floor, so he won't fall when he rolls) Home Equipment: Walker - 2 wheels      Prior Function Level of Independence: Needs assistance   Gait / Transfers Assistance Needed: Family reports that they will occasionally try some minimal walking with HHA (he apparently does not do well with the walker) but essentially is heavy assist to/from w/c for mobility  ADL's / Homemaking Assistance Needed: Pt needs assist with all aspects of dressing, bathing, etc        Hand Dominance        Extremity/Trunk Assessment   Upper Extremity Assessment Upper Extremity Assessment: Generalized weakness;Overall [facility] for tasks assessed    Lower Extremity Assessment Lower Extremity Assessment: Generalized weakness(L hip movement very painful/limited, R grossly 3-/5)       Communication  Communication: Prefers language other than English(Farsi, pt largely nonverbal, family wanting to act as interp)  Cognition Arousal/Alertness: Lethargic Behavior During Therapy: Flat affect Overall Cognitive Status: History of cognitive impairments - at baseline                                 General Comments: Pt did not open eyes or attempt speaking with family/PT      General Comments      Exercises     Assessment/Plan    PT Assessment Patient needs continued PT services  PT Problem List Decreased strength;Decreased range of motion;Decreased activity tolerance;Decreased mobility;Decreased balance;Decreased coordination;Decreased cognition;Decreased knowledge of use of DME;Decreased safety awareness;Decreased  knowledge of precautions;Pain       PT Treatment Interventions DME instruction;Gait training;Functional mobility training;Therapeutic activities;Therapeutic exercise;Balance training;Cognitive remediation;Neuromuscular re-education;Patient/family education    PT Goals (Current goals can be found in the Care Plan section)  Acute Rehab PT Goals Patient Stated Goal: pt unable to state, family hopes to keep him as active as possible PT Goal Formulation: With family Time For Goal Achievement: 09/02/17 Potential to Achieve Goals: Poor    Frequency Min 2X/week   Barriers to discharge        Co-evaluation               AM-PAC PT "6 Clicks" Daily Activity  Outcome Measure Difficulty turning over in bed (including adjusting bedclothes, sheets and blankets)?: Unable Difficulty moving from lying on back to sitting on the side of the bed? : Unable Difficulty sitting down on and standing up from a chair with arms (e.g., wheelchair, bedside commode, etc,.)?: Unable Help needed moving to and from a bed to chair (including a wheelchair)?: Total Help needed walking in hospital room?: Total Help needed climbing 3-5 steps with a railing? : Total 6 Click Score: 6    End of Session   Activity Tolerance: Patient limited by lethargy;Patient limited by pain Patient left: with bed alarm set;with call bell/phone within reach;with family/visitor present   PT Visit Diagnosis: Muscle weakness (generalized) (M62.81);Difficulty in walking, not elsewhere classified (R26.2);Pain Pain - Right/Left: Left Pain - part of body: Hip    Time: 1142-1208 PT Time Calculation (min) (ACUTE ONLY): 26 min   Charges:   PT Evaluation $PT Eval Moderate Complexity: 1 Mod     PT G Codes:        Malachi ProGalen R Eira Alpert, DPT 08/19/2017, 2:00 PM

## 2017-08-20 ENCOUNTER — Other Ambulatory Visit: Payer: Self-pay | Admitting: Internal Medicine

## 2017-08-20 ENCOUNTER — Other Ambulatory Visit: Payer: Self-pay

## 2017-08-20 ENCOUNTER — Telehealth: Payer: Self-pay | Admitting: Cardiovascular Disease

## 2017-08-20 LAB — PROTIME-INR
INR: 3.2
Prothrombin Time: 32.5 seconds — ABNORMAL HIGH (ref 11.4–15.2)

## 2017-08-20 MED ORDER — DILTIAZEM HCL 30 MG PO TABS
60.0000 mg | ORAL_TABLET | Freq: Two times a day (BID) | ORAL | Status: DC
  Administered 2017-08-20: 13:00:00 60 mg via ORAL
  Filled 2017-08-20: qty 2

## 2017-08-20 MED ORDER — DILTIAZEM HCL ER COATED BEADS 120 MG PO CP24
120.0000 mg | ORAL_CAPSULE | Freq: Every day | ORAL | Status: DC
  Filled 2017-08-20: qty 1

## 2017-08-20 MED ORDER — METOPROLOL TARTRATE 50 MG PO TABS
50.0000 mg | ORAL_TABLET | Freq: Two times a day (BID) | ORAL | Status: DC
  Administered 2017-08-20: 50 mg via ORAL
  Filled 2017-08-20: qty 1

## 2017-08-20 MED ORDER — IBUPROFEN 600 MG PO TABS: 600.0000 mg | ORAL_TABLET | Freq: Three times a day (TID) | ORAL | 0 refills | Status: AC

## 2017-08-20 MED ORDER — METOPROLOL TARTRATE 50 MG PO TABS: 50.0000 mg | ORAL_TABLET | Freq: Two times a day (BID) | ORAL | 1 refills | Status: DC

## 2017-08-20 MED ORDER — AMOXICILLIN-POT CLAVULANATE 875-125 MG PO TABS: 1.0000 | ORAL_TABLET | Freq: Two times a day (BID) | ORAL | 0 refills | Status: DC

## 2017-08-20 MED ORDER — ALPRAZOLAM 0.5 MG PO TABS: 0.5000 mg | ORAL_TABLET | Freq: Every evening | ORAL | 0 refills | Status: AC | PRN

## 2017-08-20 MED ORDER — RISPERIDONE 1 MG PO TABS: 1.0000 mg | ORAL_TABLET | Freq: Two times a day (BID) | ORAL | 1 refills | Status: AC

## 2017-08-20 MED ORDER — TRAZODONE HCL 50 MG PO TABS: 50.0000 mg | ORAL_TABLET | Freq: Every day | ORAL | 0 refills | Status: AC

## 2017-08-20 MED ORDER — WARFARIN SODIUM 5 MG PO TABS: ORAL_TABLET | ORAL | 12 refills | Status: AC

## 2017-08-20 MED ORDER — IBUPROFEN 600 MG PO TABS
600.0000 mg | ORAL_TABLET | Freq: Three times a day (TID) | ORAL | Status: DC
  Administered 2017-08-20: 10:00:00 600 mg via ORAL
  Filled 2017-08-20 (×3): qty 1

## 2017-08-20 MED ORDER — WARFARIN SODIUM 2.5 MG PO TABS
2.5000 mg | ORAL_TABLET | Freq: Once | ORAL | Status: DC
  Filled 2017-08-20: qty 1

## 2017-08-20 NOTE — Telephone Encounter (Signed)
TCM   6/12 with ryan at 130 needs 1 week for afib rvr next available too far out

## 2017-08-20 NOTE — Progress Notes (Signed)
Pt transitioning care home in care of family (spouse, children, and grandchildren). Discharge instructions provided and reviewed with pt and family.  No questions at time of discharge.

## 2017-08-20 NOTE — Telephone Encounter (Signed)
Patient discharged today 08/20/17

## 2017-08-20 NOTE — Care Management Note (Addendum)
Case Management Note  Patient Details  Name: Adam Roberson MRN: [id number] Date of Birth: November 08, 1928  Subjective/Objective:     Admitted to  regional with the diagnosis of pneumonia. Lives with wife. Daughters are Ghofrany [phone]) and Elham [phone]). Seen by Dr. Candelaria Stagershaplin at [facility] 08/11/17. Goes to the [facility] every 6 months. Gets help with medications at [facility]. [facility] medicine [facility] [address].  Family helps with activities of daily activities.   Family transports             Action/Plan: Will update [facility] representative Wende BushyHolley Green , concerning admission.,  Information about [facility] given to family members.   Expected Discharge Date:                  Expected Discharge Plan:     In-House Referral:     Discharge planning Services     Post Acute Care Choice:    Choice offered to:     DME Arranged:    DME Agency:     HH Arranged:    HH Agency:     Status of Service:     If discussed at [employer] Meetings, dates discussed:    Additional Comments:  Gwenette GreetBrenda S Kyleah Pensabene, RN MSN CCM Care Management [phone] 08/20/2017, 9:48 AM

## 2017-08-20 NOTE — Consult Note (Signed)
ANTICOAGULATION CONSULT NOTE - Initial Consult  Pharmacy Consult for Warfarin Dosing  Indication: atrial fibrillation, DVT, stroke and VTE prophylaxis  Patient Measurements: Weight: 146 lb (66.2 kg)   Vital Signs: Temp: 98.6 F (37 C) (06/06 1934) Temp Source: Axillary (06/06 1934) BP: 126/89 (06/06 1934) Pulse Rate: 136 (06/06 1934)  Labs: Recent Labs    08/19/17 0453 08/20/17 0543  HGB 13.4  --   HCT 38.9*  --   PLT 151  --   LABPROT 39.1* 32.5*  INR 4.05* 3.20  CREATININE 0.66  --     Estimated Creatinine Clearance: 52.4 mL/min (by C-G formula based on SCr of 0.66 mg/dL).   Medical History: Past Medical History:  Diagnosis Date  . CAD (coronary artery disease)    a.2003 s/p CABG x3 in [place]; b. ? 2014 PCI (records not available).  . Chronic diastolic CHF (congestive heart failure) (HCC)    a. 06/2015 Echo: EF 60-65%, mod dil LA, nl RV, mild to mod TR, PASP 46mmHg.  Marland Kitchen. DVT (deep venous thrombosis) (HCC)   . DVT of leg (deep venous thrombosis) (HCC)   . Dysrhythmia   . H/O: CVA (cerebrovascular accident)   . HOH (hard of hearing)   . Hyperlipidemia   . Hypertension   . Hypertensive heart disease   . Parkinson disease (HCC)   . Permanent atrial fibrillation (HCC)    a. CHA2DS2VASc= 7-->coumadin (followed by primary care).  . Pneumonia    4/17  . Stroke [facility](HCC)     Assessment: Pharmacy consulted for warfarin dosing and monitoring in 84 yo male with PMH of  atrial fibrillation, DVT, and stroke. INR on admission was therapeutic at 2.85. Patient is receiving Abx for PNA.   Home Regimen: Warfarin 5mg : Sun,Tues, Weds, Thurs, Sat                             Warfarin 2.5mg : Mon, Fri  DATE INR DOSE 6/5 2.85 5mg  6/6 4.05 HELD 6/7 3.20  Goal of Therapy:  INR 2-3 Monitor platelets by anticoagulation protocol: Yes   Plan:   INR slightly supratherapeutic this morning, but appears to be trending down. Will give patient's home dose of warfarin 2.5mg  x 1 today. Will  monitor INRs daily per protocol while patient is on antibiotics. INR ordered with AM labs.   Gardner CandleSheema M Mukesh Kornegay, PharmD, BCPS Clinical Pharmacist 08/20/2017 7:10 AM

## 2017-08-20 NOTE — Discharge Summary (Signed)
[facility]   PATIENT NAME: Adam Roberson    MR#:  [id number]  DATE OF BIRTH:  May 21, 1928  DATE OF ADMISSION:  08/17/2017 ADMITTING PHYSICIAN: Arnaldo Natal, MD  DATE OF DISCHARGE: 08/20/2017  PRIMARY CARE PHYSICIAN: [facility], Gavin Potters    ADMISSION DIAGNOSIS:  Sepsis, due to unspecified organism (HCC) [A41.9] Altered mental status, unspecified altered mental status type [R41.82] Community acquired pneumonia of left lung, unspecified part of lung [J18.9]  DISCHARGE DIAGNOSIS:  sepsis secondary to possible aspiration pneumonia left thigh  pain likely secondary to local inflammation of the muscle per CT atrial fibrillation chronic on warfarin dementia  SECONDARY DIAGNOSIS:   Past Medical History:  Diagnosis Date  . CAD (coronary artery disease)    a.2003 s/p CABG x3 in [place]; b. ? 2014 PCI (records not available).  . Chronic diastolic CHF (congestive heart failure) (HCC)    a. 06/2015 Echo: EF 60-65%, mod dil LA, nl RV, mild to mod TR, PASP .  Marland Kitchen DVT (deep venous thrombosis) (HCC)   . DVT of leg (deep venous thrombosis) (HCC)   . Dysrhythmia   . H/O: CVA (cerebrovascular accident)   . HOH (hard of hearing)   . Hyperlipidemia   . Hypertension   . Hypertensive heart disease   . Parkinson disease (HCC)   . Permanent atrial fibrillation (HCC)    a. CHA2DS2VASc= 7-->coumadin (followed by primary care).  . Pneumonia    4/17  . Stroke [facility])     HOSPITAL COURSE:   84 year old male CAD status post PCI, chronic diastolic CHF, atrial fibrillation, hypertension, Parkinson's disease and stroke presents to the emergency department with altered mental status and weakness. The patient has appeared to be febrile and shivering. In the emergency department temperature was found to be 103.7 F.   1. Febrile illness with sepsis like presentation -no exact etiology determined. Patient is been empirically treated for possible  aspiration pneumonia. Chest x-ray not impressive.  -Change to Augmentin PO BID. Patient tolerating diet well. No fever.  -Weaned to room air.   2. Sepsis: The patient meets criteria via fever, tachycardia and tachypnea. He is hemodynamically stable. -Blood culture so far negative. White count normalized.  3. Acute kidney injury: Nephrotoxic agents. Hydrate with intravenous fluid. -Creatinine back to baseline.  4.  left thigh pain  -x-ray of the pelvis negative for any fracture. -Patient had some amount of left thigh pain on palpation. Clinically no signs of cellulitis. - CT of the femur left done showed mild inflammation in the posterior inferior part of the thigh -will continue pain meds PRN along with finish of course of antibiotic with Augmentin and added ibuprofen tid for few days -CT was reviewed by orthopedic on call. Curbside it on the phone -grandson and daughter aware about it  5. Atrial fibrillation: Rate controlled; continue warfarin when dose verified. (The patient may be on diltiazem; resume when home meds verified) -INR 4.05-- 3.2 -Will hold Coumadin today and tomorrow. Patient will start taking 2.5 mg from Friday and Saturday and then will resume his normal regimen as before. This was explained to the daughter. -Continue Cardizem and increase beta-blocker to 50 BID -patient's heart rate currently is 90-100  6. CHF: Chronic; diastolic. Continue Lasix per home regimen  7. Parkinson's disease: Continue Sinemet and Risperdal  -patient has baseline dementia  8. Hyperlipidemia: Continue statin therapy  9. DVT prophylaxis: Full dose anticoagulation  overall seems to have improved. Will discharge to home. Patient  is being taken care of my daughters and grandson.  CONSULTS OBTAINED:    DRUG ALLERGIES:  No Known Allergies  DISCHARGE MEDICATIONS:   Allergies as of 08/20/2017   No Known Allergies     Medication List    TAKE these medications    ALPRAZolam 0.5 MG tablet Commonly known as:  XANAX Take 1 tablet (0.5 mg total) by mouth at bedtime as needed for anxiety. Takes 3 times weekly.   amoxicillin-clavulanate 875-125 MG tablet Commonly known as:  AUGMENTIN Take 1 tablet by mouth every 12 (twelve) hours.   atorvastatin 20 MG tablet Commonly known as:  LIPITOR Take 1 tablet (20 mg total) by mouth daily.   carbidopa-levodopa 25-250 MG tablet Commonly known as:  SINEMET IR Take one tablet by mouth three times a day.   diltiazem 120 MG 24 hr capsule Commonly known as:  CARDIZEM CD Take 1 capsule (120 mg total) by mouth daily.   donepezil 10 MG tablet Commonly known as:  ARICEPT Take 5 mg by mouth at bedtime.   furosemide 40 MG tablet Commonly known as:  LASIX Take 1 tablet (40 mg total) by mouth daily. What changed:  Another medication with the same name was removed. Continue taking this medication, and follow the directions you see here.   ibuprofen 600 MG tablet Commonly known as:  ADVIL,MOTRIN Take 1 tablet (600 mg total) by mouth 3 (three) times daily. for 3-4 days with food   metoprolol tartrate 50 MG tablet Commonly known as:  LOPRESSOR Take 1 tablet (50 mg total) by mouth 2 (two) times daily. Takes 1/2 tab BID What changed:  additional instructions   risperiDONE 1 MG tablet Commonly known as:  RISPERDAL Take 1 tablet (1 mg total) by mouth 2 (two) times daily.   traZODone 50 MG tablet Commonly known as:  DESYREL Take 1 tablet (50 mg total) by mouth at bedtime. What changed:  additional instructions   warfarin 5 MG tablet Commonly known as:  COUMADIN Take 2.5 mg on sat and Sunday and then resume the routine  With Taking 1/2 Tablet on Monday and Friday. Take 1 Tablet on Tuesday, Wednesday, Thursday, Saturday and Sunday or as directed. What changed:  additional instructions       If you experience worsening of your admission symptoms, develop shortness of breath, life threatening emergency, suicidal  or homicidal thoughts you must seek medical attention immediately by calling 911 or calling your MD immediately  if symptoms less severe.  You Must read complete instructions/literature along with all the possible adverse reactions/side effects for all the Medicines you take and that have been prescribed to you. Take any new Medicines after you have completely understood and accept all the possible adverse reactions/side effects.   Please note  You were cared for by a hospitalist during your hospital stay. If you have any questions about your discharge medications or the care you received while you were in the hospital after you are discharged, you can call the unit and asked to speak with the hospitalist on call if the hospitalist that took care of you is not available. Once you are discharged, your primary care physician will handle any further medical issues. Please note that NO REFILLS for any discharge medications will be authorized once you are discharged, as it is imperative that you return to your primary care physician (or establish a relationship with a primary care physician if you do not have one) for your aftercare needs so that they can  reassess your need for medications and monitor your lab values. Today   SUBJECTIVE   Patient is nonverbal due to language barrier and dementia. Daughter in the room.  VITAL SIGNS:  Blood pressure (!) 136/95, pulse (!) 135, temperature 98 F (36.7 C), temperature source Axillary, resp. rate 18, weight 66.2 kg (146 lb), SpO2 95 %.  I/O:    Intake/Output Summary (Last 24 hours) at 08/20/2017 1504 Last data filed at 08/20/2017 1320 Gross per 24 hour  Intake 600 ml  Output -  Net 600 ml    PHYSICAL EXAMINATION:  GENERAL:  84 y.o.-year-old patient lying in the bed with no acute distress.  EYES: Pupils equal, round, reactive to light and accommodation. No scleral icterus. Extraocular muscles intact.  HEENT: Head atraumatic, normocephalic. Oropharynx  and nasopharynx clear.  NECK:  Supple, no jugular venous distention. No thyroid enlargement, no tenderness.  LUNGS: Normal breath sounds bilaterally, no wheezing, rales,rhonchi or crepitation. No use of accessory muscles of respiration.  CARDIOVASCULAR: S1, S2 normal. No murmurs, rubs, or gallops. irregulary rhythm ABDOMEN: Soft, non-tender, non-distended. Bowel sounds present. No organomegaly or mass.  EXTREMITIES: No pedal edema, cyanosis, or clubbing. Patient does not have severe tenderness around the left thigh which he had yesterday NEUROLOGIC: moves all extremities well  PSYCHIATRIC:  patient is alert but non-verbal SKIN: No obvious rash, lesion, or ulcer.   DATA REVIEW:   CBC  Recent Labs  Lab 08/19/17 0453  WBC 8.3  HGB 13.4  HCT 38.9*  PLT 151    Chemistries  Recent Labs  Lab 08/17/17 0003 08/19/17 0453  NA 134* 140  K 3.9 3.5  CL 101 109  CO2 23 24  GLUCOSE 160* 114*  BUN 17 10  CREATININE 1.32* 0.66  CALCIUM 8.5* 7.6*  AST 54*  --   ALT 9*  --   ALKPHOS 85  --   BILITOT 1.5*  --     Microbiology Results   Recent Results (from the past 240 hour(s))  Urine culture     Status: None   Collection Time: 08/17/17 12:03 AM  Result Value Ref Range Status   Specimen Description   Final    URINE, RANDOM Performed at [facility], [address] [place], [place] [address]    Special Requests   Final    NONE Performed at [facility], [address] [place], [place] [address]    Culture   Final    NO GROWTH Performed at [facility], [address] [place], [place] [address]    Report Status 08/19/2017 FINAL  Final  Culture, blood (Routine x 2)     Status: None (Preliminary result)   Collection Time: 08/18/17 12:03 AM  Result Value Ref Range Status   Specimen Description BLOOD RIGHT HAND  Final   Special Requests   Final    BOTTLES DRAWN AEROBIC AND ANAEROBIC Blood Culture adequate volume   Culture   Final    NO GROWTH 2  DAYS Performed at [facility], [address] [place], [place] [address]    Report Status PENDING  Incomplete  Culture, blood (Routine x 2)     Status: None (Preliminary result)   Collection Time: 08/18/17 12:04 AM  Result Value Ref Range Status   Specimen Description BLOOD LEFT HAND  Final   Special Requests   Final    BOTTLES DRAWN AEROBIC AND ANAEROBIC Blood Culture results may not be optimal due to an excessive volume of  blood received in culture bottles   Culture   Final    NO GROWTH 2 DAYS Performed at [facility], [address] [place], [place] [address]    Report Status PENDING  Incomplete  MRSA PCR Screening     Status: None   Collection Time: 08/18/17 10:28 AM  Result Value Ref Range Status   MRSA by PCR NEGATIVE NEGATIVE Final    Comment:        The GeneXpert MRSA Assay (FDA approved for NASAL specimens only), is one component of a comprehensive MRSA colonization surveillance program. It is not intended to diagnose MRSA infection nor to guide or monitor treatment for MRSA infections. Performed at [facility], [address] [place], [place] [address]   Respiratory Panel by PCR     Status: None   Collection Time: 08/18/17  8:44 PM  Result Value Ref Range Status   Adenovirus NOT DETECTED NOT DETECTED Final   Coronavirus 229E NOT DETECTED NOT DETECTED Final   Coronavirus HKU1 NOT DETECTED NOT DETECTED Final   Coronavirus NL63 NOT DETECTED NOT DETECTED Final   Coronavirus OC43 NOT DETECTED NOT DETECTED Final   Metapneumovirus NOT DETECTED NOT DETECTED Final   Rhinovirus / Enterovirus NOT DETECTED NOT DETECTED Final   Influenza A NOT DETECTED NOT DETECTED Final   Influenza B NOT DETECTED NOT DETECTED Final   Parainfluenza Virus 1 NOT DETECTED NOT DETECTED Final   Parainfluenza Virus 2 NOT DETECTED NOT DETECTED Final   Parainfluenza Virus 3 NOT DETECTED NOT DETECTED Final   Parainfluenza Virus 4 NOT DETECTED NOT DETECTED Final    Respiratory Syncytial Virus NOT DETECTED NOT DETECTED Final   Bordetella pertussis NOT DETECTED NOT DETECTED Final   Chlamydophila pneumoniae NOT DETECTED NOT DETECTED Final   Mycoplasma pneumoniae NOT DETECTED NOT DETECTED Final    RADIOLOGY:  Ct Femur Left Wo Contrast  Result Date: 08/19/2017 CLINICAL DATA:  Left lateral hip pain worsening with movement. History of dementia, Parkinson's disease and atrial fibrillation. No known recent injury. EXAM: CT OF THE LOWER LEFT EXTREMITY WITHOUT CONTRAST TECHNIQUE: Multidetector CT imaging of the left thigh was performed according to the standard protocol. COMPARISON:  Left hip radiographs 08/19/2017. FINDINGS: Bones/Joint/Cartilage Images extend from the left hip through the left knee. The left femur appears normal without evidence of acute fracture or dislocation. There is no focal osseous abnormality. Mild degenerative changes are present at the left hip and knee. No significant joint effusions. Ligaments Suboptimally assessed by CT. Muscles and Tendons The left thigh muscles and tendons appear normal. The extensor mechanism is intact at the knee. Soft tissues Mild subcutaneous edema posterolaterally in the proximal left thigh, extending into the buttock. No focal fluid collection identified. Iliofemoral atherosclerosis noted. IMPRESSION: 1. The left femur appears normal without acute findings. Mild left hip and knee degenerative changes. 2. Mild subcutaneous edema posterolaterally in the proximal left thigh, possibly cellulitis. Correlate clinically. No focal fluid collection. Electronically Signed   By: Carey Bullocks M.D.   On: 08/19/2017 15:54   Dg Hip Unilat With Pelvis 2-3 Views Left  Result Date: 08/19/2017 CLINICAL DATA:  Patient with dementia, Parkinson's disease, A. Fib, and hypertension presents to hospital secondary to weakness and high fevers; Pt poor historian; per nurse and family, pt c/o left hip pain; no injury EXAM: DG HIP (WITH OR WITHOUT  PELVIS) 2-3V LEFT COMPARISON:  None. FINDINGS: There is no evidence of hip fracture or dislocation. There is no evidence of arthropathy or other  focal bone abnormality. IMPRESSION: Negative. Electronically Signed   By: Norva Pavlov M.D.   On: 08/19/2017 10:01     Management plans discussed with the patient, family and they are in agreement.  CODE STATUS:     Code Status Orders  (From admission, onward)        Start     Ordered   08/18/17 0341  Full code  Continuous     08/18/17 0340    Code Status History    Date Active Date Inactive Code Status Order ID Comments User Context   06/28/2015 2331 07/05/2015 1644 Full Code [id number]  Enid Baas, MD Inpatient      TOTAL TIME TAKING CARE OF THIS PATIENT: 40 minutes.    Enedina Finner M.D on 08/20/2017 at 3:04 PM  Between 7am to 6pm - Pager - [phone] After 6pm go to [email or web address] - Social research officer, government  [facility]  Office  [phone]  CC: Primary care physician; Raynelle Bring

## 2017-08-20 NOTE — Progress Notes (Signed)
Speech Therapy note: reviewed chart notes, consulted NSG who reported was continuing to do well w/ his po's - appears at his baseline w/ this type of diet consistency of a Dysphagia level 2 (minced foods). Family tends to mince the foods and put in a soup to soften and make "easier" for pt to eat.  Handouts were given; precautions. ST services will sign off w/ NSG to reconsult if needed while admitted.    Jerilynn SomKatherine Watson, MS, CCC-SLP

## 2017-08-20 NOTE — Discharge Instructions (Signed)
Get your PT/INR check before with your primary care or cardiologist as per your routine.

## 2017-08-23 LAB — CULTURE, BLOOD (ROUTINE X 2)
CULTURE: NO GROWTH
CULTURE: NO GROWTH
SPECIAL REQUESTS: ADEQUATE

## 2017-08-25 ENCOUNTER — Ambulatory Visit: Payer: Self-pay | Admitting: Physician Assistant

## 2017-08-25 ENCOUNTER — Ambulatory Visit: Payer: Self-pay | Admitting: Internal Medicine

## 2017-08-30 NOTE — Telephone Encounter (Signed)
Patient was scheduled to see Alycia RossettiRyan, [place] on 6/12, but called and cancelled.

## 2017-10-27 ENCOUNTER — Other Ambulatory Visit: Payer: Self-pay | Admitting: Internal Medicine

## 2017-10-27 DIAGNOSIS — G20 Parkinson's disease: Secondary | ICD-10-CM

## 2017-10-28 ENCOUNTER — Other Ambulatory Visit: Payer: Self-pay | Admitting: Internal Medicine

## 2017-11-08 ENCOUNTER — Other Ambulatory Visit: Payer: Self-pay

## 2017-12-02 ENCOUNTER — Other Ambulatory Visit: Payer: Self-pay | Admitting: Internal Medicine

## 2017-12-04 ENCOUNTER — Ambulatory Visit: Payer: Self-pay | Admitting: Internal Medicine

## 2017-12-22 ENCOUNTER — Other Ambulatory Visit: Payer: Self-pay | Admitting: Adult Health Nurse Practitioner

## 2017-12-22 ENCOUNTER — Other Ambulatory Visit: Payer: Self-pay | Admitting: Internal Medicine

## 2017-12-22 DIAGNOSIS — G20 Parkinson's disease: Secondary | ICD-10-CM

## 2018-01-03 ENCOUNTER — Other Ambulatory Visit: Payer: Self-pay | Admitting: Psychiatry

## 2018-02-02 ENCOUNTER — Other Ambulatory Visit: Payer: Self-pay

## 2018-02-09 ENCOUNTER — Ambulatory Visit: Payer: Self-pay | Admitting: Internal Medicine

## 2018-02-09 ENCOUNTER — Telehealth: Payer: Self-pay | Admitting: Pharmacy Technician

## 2018-02-09 NOTE — Telephone Encounter (Signed)
Patient has Medicaid.  No longer meets eligibility criteria [facility].  Pt notified.  Sherilyn DacostaBetty J. Ted Goodner Care Manager [facility]

## 2018-02-13 ENCOUNTER — Encounter: Payer: Self-pay | Admitting: Radiology

## 2018-02-13 ENCOUNTER — Emergency Department: Payer: Medicaid Other

## 2018-02-13 ENCOUNTER — Inpatient Hospital Stay: Payer: Medicaid Other

## 2018-02-13 ENCOUNTER — Inpatient Hospital Stay
Admission: EM | Admit: 2018-02-13 | Discharge: 2018-03-16 | DRG: 870 | Disposition: E | Payer: Medicaid Other | Attending: Internal Medicine | Admitting: Internal Medicine

## 2018-02-13 ENCOUNTER — Inpatient Hospital Stay (HOSPITAL_COMMUNITY)
Admit: 2018-02-13 | Discharge: 2018-02-13 | Disposition: A | Discharge status: Home / Self Care | Payer: Medicaid Other | Attending: Cardiovascular Disease | Admitting: Cardiovascular Disease

## 2018-02-13 DIAGNOSIS — Z66 Do not resuscitate: Secondary | ICD-10-CM | POA: Diagnosis not present

## 2018-02-13 DIAGNOSIS — I5043 Acute on chronic combined systolic (congestive) and diastolic (congestive) heart failure: Secondary | ICD-10-CM | POA: Diagnosis present

## 2018-02-13 DIAGNOSIS — Z791 Long term (current) use of non-steroidal anti-inflammatories (NSAID): Secondary | ICD-10-CM

## 2018-02-13 DIAGNOSIS — R6520 Severe sepsis without septic shock: Secondary | ICD-10-CM | POA: Diagnosis present

## 2018-02-13 DIAGNOSIS — J9811 Atelectasis: Secondary | ICD-10-CM | POA: Diagnosis present

## 2018-02-13 DIAGNOSIS — Z86718 Personal history of other venous thrombosis and embolism: Secondary | ICD-10-CM

## 2018-02-13 DIAGNOSIS — G20 Parkinson's disease: Secondary | ICD-10-CM | POA: Diagnosis present

## 2018-02-13 DIAGNOSIS — J189 Pneumonia, unspecified organism: Secondary | ICD-10-CM | POA: Diagnosis present

## 2018-02-13 DIAGNOSIS — N179 Acute kidney failure, unspecified: Secondary | ICD-10-CM | POA: Diagnosis present

## 2018-02-13 DIAGNOSIS — I4891 Unspecified atrial fibrillation: Secondary | ICD-10-CM | POA: Diagnosis present

## 2018-02-13 DIAGNOSIS — Z515 Encounter for palliative care: Secondary | ICD-10-CM | POA: Diagnosis present

## 2018-02-13 DIAGNOSIS — Z7901 Long term (current) use of anticoagulants: Secondary | ICD-10-CM

## 2018-02-13 DIAGNOSIS — R739 Hyperglycemia, unspecified: Secondary | ICD-10-CM | POA: Diagnosis present

## 2018-02-13 DIAGNOSIS — I2510 Atherosclerotic heart disease of native coronary artery without angina pectoris: Secondary | ICD-10-CM | POA: Diagnosis present

## 2018-02-13 DIAGNOSIS — E785 Hyperlipidemia, unspecified: Secondary | ICD-10-CM | POA: Diagnosis present

## 2018-02-13 DIAGNOSIS — E872 Acidosis: Secondary | ICD-10-CM | POA: Diagnosis present

## 2018-02-13 DIAGNOSIS — J9600 Acute respiratory failure, unspecified whether with hypoxia or hypercapnia: Secondary | ICD-10-CM

## 2018-02-13 DIAGNOSIS — Z8673 Personal history of transient ischemic attack (TIA), and cerebral infarction without residual deficits: Secondary | ICD-10-CM

## 2018-02-13 DIAGNOSIS — I4821 Permanent atrial fibrillation: Secondary | ICD-10-CM | POA: Diagnosis present

## 2018-02-13 DIAGNOSIS — I110 Hypertensive heart disease with heart failure: Secondary | ICD-10-CM | POA: Diagnosis present

## 2018-02-13 DIAGNOSIS — F0280 Dementia in other diseases classified elsewhere without behavioral disturbance: Secondary | ICD-10-CM | POA: Diagnosis present

## 2018-02-13 DIAGNOSIS — R570 Cardiogenic shock: Secondary | ICD-10-CM | POA: Diagnosis present

## 2018-02-13 DIAGNOSIS — I361 Nonrheumatic tricuspid (valve) insufficiency: Secondary | ICD-10-CM | POA: Diagnosis not present

## 2018-02-13 DIAGNOSIS — I255 Ischemic cardiomyopathy: Secondary | ICD-10-CM

## 2018-02-13 DIAGNOSIS — Z9911 Dependence on respirator [ventilator] status: Secondary | ICD-10-CM | POA: Diagnosis not present

## 2018-02-13 DIAGNOSIS — Z951 Presence of aortocoronary bypass graft: Secondary | ICD-10-CM

## 2018-02-13 DIAGNOSIS — R6521 Severe sepsis with septic shock: Secondary | ICD-10-CM

## 2018-02-13 DIAGNOSIS — A419 Sepsis, unspecified organism: Secondary | ICD-10-CM | POA: Diagnosis present

## 2018-02-13 DIAGNOSIS — R7989 Other specified abnormal findings of blood chemistry: Secondary | ICD-10-CM

## 2018-02-13 DIAGNOSIS — Z8249 Family history of ischemic heart disease and other diseases of the circulatory system: Secondary | ICD-10-CM

## 2018-02-13 DIAGNOSIS — Z7189 Other specified counseling: Secondary | ICD-10-CM | POA: Diagnosis not present

## 2018-02-13 DIAGNOSIS — H9190 Unspecified hearing loss, unspecified ear: Secondary | ICD-10-CM | POA: Diagnosis present

## 2018-02-13 DIAGNOSIS — Z95828 Presence of other vascular implants and grafts: Secondary | ICD-10-CM

## 2018-02-13 DIAGNOSIS — J9601 Acute respiratory failure with hypoxia: Secondary | ICD-10-CM | POA: Diagnosis present

## 2018-02-13 DIAGNOSIS — Z79899 Other long term (current) drug therapy: Secondary | ICD-10-CM

## 2018-02-13 DIAGNOSIS — T45515A Adverse effect of anticoagulants, initial encounter: Secondary | ICD-10-CM | POA: Diagnosis present

## 2018-02-13 DIAGNOSIS — E876 Hypokalemia: Secondary | ICD-10-CM | POA: Diagnosis present

## 2018-02-13 DIAGNOSIS — T17910A Gastric contents in respiratory tract, part unspecified causing asphyxiation, initial encounter: Secondary | ICD-10-CM

## 2018-02-13 DIAGNOSIS — I420 Dilated cardiomyopathy: Secondary | ICD-10-CM | POA: Diagnosis not present

## 2018-02-13 DIAGNOSIS — E870 Hyperosmolality and hypernatremia: Secondary | ICD-10-CM | POA: Diagnosis present

## 2018-02-13 DIAGNOSIS — R778 Other specified abnormalities of plasma proteins: Secondary | ICD-10-CM

## 2018-02-13 DIAGNOSIS — I5021 Acute systolic (congestive) heart failure: Secondary | ICD-10-CM | POA: Diagnosis not present

## 2018-02-13 DIAGNOSIS — Z87891 Personal history of nicotine dependence: Secondary | ICD-10-CM

## 2018-02-13 DIAGNOSIS — G9340 Encephalopathy, unspecified: Secondary | ICD-10-CM | POA: Diagnosis present

## 2018-02-13 DIAGNOSIS — I340 Nonrheumatic mitral (valve) insufficiency: Secondary | ICD-10-CM

## 2018-02-13 DIAGNOSIS — I214 Non-ST elevation (NSTEMI) myocardial infarction: Secondary | ICD-10-CM

## 2018-02-13 DIAGNOSIS — R0603 Acute respiratory distress: Secondary | ICD-10-CM | POA: Diagnosis present

## 2018-02-13 DIAGNOSIS — I5023 Acute on chronic systolic (congestive) heart failure: Secondary | ICD-10-CM | POA: Diagnosis not present

## 2018-02-13 LAB — BLOOD GAS, ARTERIAL
Acid-Base Excess: 2.1 mmol/L — ABNORMAL HIGH (ref 0.0–2.0)
BICARBONATE: 25.5 mmol/L (ref 20.0–28.0)
FIO2: 60
MECHVT: 500 mL
O2 SAT: 99.9 %
PEEP/CPAP: 5 cmH2O
Patient temperature: 37
RATE: 16 resp/min
pCO2 arterial: 35 mmHg (ref 32.0–48.0)
pH, Arterial: 7.47 — ABNORMAL HIGH (ref 7.350–7.450)
pO2, Arterial: 245 mmHg — ABNORMAL HIGH (ref 83.0–108.0)

## 2018-02-13 LAB — PROTIME-INR
INR: 2.81
Prothrombin Time: 29.2 seconds — ABNORMAL HIGH (ref 11.4–15.2)

## 2018-02-13 LAB — URINALYSIS, ROUTINE W REFLEX MICROSCOPIC
Bacteria, UA: NONE SEEN
Bilirubin Urine: NEGATIVE
Glucose, UA: 50 mg/dL — AB
Ketones, ur: 5 mg/dL — AB
Leukocytes, UA: NEGATIVE
Nitrite: NEGATIVE
Protein, ur: 100 mg/dL — AB
Specific Gravity, Urine: 1.021 (ref 1.005–1.030)
pH: 5 (ref 5.0–8.0)

## 2018-02-13 LAB — TSH: TSH: 0.681 u[IU]/mL (ref 0.350–4.500)

## 2018-02-13 LAB — CBC WITH DIFFERENTIAL/PLATELET
Abs Immature Granulocytes: 0.07 10*3/uL (ref 0.00–0.07)
Basophils Absolute: 0.1 10*3/uL (ref 0.0–0.1)
Basophils Relative: 1 %
Eosinophils Absolute: 0 10*3/uL (ref 0.0–0.5)
Eosinophils Relative: 0 %
HEMATOCRIT: 45.4 % (ref 39.0–52.0)
Hemoglobin: 14.9 g/dL (ref 13.0–17.0)
Immature Granulocytes: 1 %
Lymphocytes Relative: 6 %
Lymphs Abs: 0.8 10*3/uL (ref 0.7–4.0)
MCH: 29.4 pg (ref 26.0–34.0)
MCHC: 32.8 g/dL (ref 30.0–36.0)
MCV: 89.7 fL (ref 80.0–100.0)
MONO ABS: 0.7 10*3/uL (ref 0.1–1.0)
Monocytes Relative: 5 %
Neutro Abs: 10.9 10*3/uL — ABNORMAL HIGH (ref 1.7–7.7)
Neutrophils Relative %: 87 %
Platelets: 253 10*3/uL (ref 150–400)
RBC: 5.06 MIL/uL (ref 4.22–5.81)
RDW: 13.1 % (ref 11.5–15.5)
WBC: 12.5 10*3/uL — ABNORMAL HIGH (ref 4.0–10.5)
nRBC: 0 % (ref 0.0–0.2)

## 2018-02-13 LAB — CG4 I-STAT (LACTIC ACID): Lactic Acid, Venous: 2.85 mmol/L (ref 0.5–1.9)

## 2018-02-13 LAB — COMPREHENSIVE METABOLIC PANEL
ALT: 17 U/L (ref 0–44)
AST: 102 U/L — ABNORMAL HIGH (ref 15–41)
Albumin: 3.6 g/dL (ref 3.5–5.0)
Alkaline Phosphatase: 86 U/L (ref 38–126)
Anion gap: 11 (ref 5–15)
BILIRUBIN TOTAL: 0.6 mg/dL (ref 0.3–1.2)
BUN: 20 mg/dL (ref 8–23)
CO2: 27 mmol/L (ref 22–32)
Calcium: 8.7 mg/dL — ABNORMAL LOW (ref 8.9–10.3)
Chloride: 100 mmol/L (ref 98–111)
Creatinine, Ser: 0.96 mg/dL (ref 0.61–1.24)
Glucose, Bld: 172 mg/dL — ABNORMAL HIGH (ref 70–99)
Potassium: 3.3 mmol/L — ABNORMAL LOW (ref 3.5–5.1)
Sodium: 138 mmol/L (ref 135–145)
Total Protein: 7.7 g/dL (ref 6.5–8.1)

## 2018-02-13 LAB — HEMOGLOBIN AND HEMATOCRIT, BLOOD
HCT: 40.9 % (ref 39.0–52.0)
Hemoglobin: 13 g/dL (ref 13.0–17.0)

## 2018-02-13 LAB — TROPONIN I
TROPONIN I: 24.64 ng/mL — AB (ref <0.03)
Troponin I: 26.62 ng/mL (ref <0.03)
Troponin I: 19.25 ng/mL (ref <0.03)

## 2018-02-13 LAB — INFLUENZA PANEL BY PCR (TYPE A & B)
Influenza A By PCR: NEGATIVE
Influenza B By PCR: NEGATIVE

## 2018-02-13 LAB — HEPARIN LEVEL (UNFRACTIONATED)

## 2018-02-13 LAB — APTT
aPTT: >160 seconds (ref 24–36)
aPTT: 74 seconds — ABNORMAL HIGH (ref 24–36)
aPTT: 46 seconds — ABNORMAL HIGH (ref 24–36)

## 2018-02-13 LAB — LACTIC ACID, PLASMA
Lactic Acid, Venous: 2.6 mmol/L (ref 0.5–1.9)
Lactic Acid, Venous: 1.8 mmol/L (ref 0.5–1.9)
Lactic Acid, Venous: 1.3 mmol/L (ref 0.5–1.9)

## 2018-02-13 LAB — MRSA PCR SCREENING: MRSA by PCR: NEGATIVE

## 2018-02-13 LAB — LIPASE, BLOOD: LIPASE: 45 U/L (ref 11–51)

## 2018-02-13 LAB — GLUCOSE, CAPILLARY: Glucose-Capillary: 134 mg/dL — ABNORMAL HIGH (ref 70–99)

## 2018-02-13 LAB — PROCALCITONIN: Procalcitonin: <0.1 ng/mL

## 2018-02-13 MED ORDER — METOPROLOL TARTRATE 5 MG/5ML IV SOLN
2.5000 mg | Freq: Four times a day (QID) | INTRAVENOUS | Status: DC | PRN
  Administered 2018-02-14: 2.5 mg via INTRAVENOUS
  Filled 2018-02-13: qty 5

## 2018-02-13 MED ORDER — PROPOFOL 1000 MG/100ML IV EMUL
5.0000 ug/kg/min | INTRAVENOUS | Status: DC
  Administered 2018-02-13: 30 ug/kg/min via INTRAVENOUS
  Administered 2018-02-13: 5 ug/kg/min via INTRAVENOUS
  Administered 2018-02-14: 20 ug/kg/min via INTRAVENOUS
  Administered 2018-02-14: 30 ug/kg/min via INTRAVENOUS
  Administered 2018-02-15: 10 ug/kg/min via INTRAVENOUS
  Administered 2018-02-15: 20 ug/kg/min via INTRAVENOUS
  Filled 2018-02-13 (×8): qty 100

## 2018-02-13 MED ORDER — DOPAMINE-DEXTROSE 3.2-5 MG/ML-% IV SOLN
0.0000 ug/kg/min | INTRAVENOUS | Status: DC
  Administered 2018-02-13: 5 ug/kg/min via INTRAVENOUS

## 2018-02-13 MED ORDER — SODIUM CHLORIDE 0.9 % IV SOLN
INTRAVENOUS | Status: DC
  Administered 2018-02-13 – 2018-02-14 (×3): via INTRAVENOUS
  Administered 2018-02-14: 125 mL/h via INTRAVENOUS
  Administered 2018-02-14 – 2018-02-15 (×2): via INTRAVENOUS

## 2018-02-13 MED ORDER — ACETAMINOPHEN 10 MG/ML IV SOLN
1000.0000 mg | Freq: Once | INTRAVENOUS | Status: AC
  Administered 2018-02-13: 1000 mg via INTRAVENOUS
  Filled 2018-02-13: qty 100

## 2018-02-13 MED ORDER — ONDANSETRON HCL 4 MG/2ML IJ SOLN: 4.0000 mg | Freq: Four times a day (QID) | INTRAMUSCULAR | Status: DC | PRN

## 2018-02-13 MED ORDER — VANCOMYCIN HCL IN DEXTROSE 1-5 GM/200ML-% IV SOLN
1000.0000 mg | Freq: Once | INTRAVENOUS | Status: AC
  Administered 2018-02-13: 1000 mg via INTRAVENOUS
  Filled 2018-02-13: qty 200

## 2018-02-13 MED ORDER — CHLORHEXIDINE GLUCONATE 0.12% ORAL RINSE (MEDLINE KIT)
15.0000 mL | Freq: Two times a day (BID) | OROMUCOSAL | Status: DC
  Administered 2018-02-13 – 2018-02-22 (×18): 15 mL via OROMUCOSAL

## 2018-02-13 MED ORDER — FENTANYL CITRATE (PF) 100 MCG/2ML IJ SOLN
150.0000 ug | Freq: Once | INTRAMUSCULAR | Status: AC
  Administered 2018-02-13: 150 ug via INTRAVENOUS

## 2018-02-13 MED ORDER — ASPIRIN 300 MG RE SUPP
300.0000 mg | Freq: Every day | RECTAL | Status: DC
  Administered 2018-02-13: 300 mg via RECTAL
  Filled 2018-02-13: qty 1

## 2018-02-13 MED ORDER — ACETAMINOPHEN 650 MG RE SUPP: 650.0000 mg | Freq: Four times a day (QID) | RECTAL | Status: DC | PRN

## 2018-02-13 MED ORDER — VANCOMYCIN HCL IN DEXTROSE 1-5 GM/200ML-% IV SOLN
1000.0000 mg | INTRAVENOUS | Status: DC
  Administered 2018-02-13: 1000 mg via INTRAVENOUS
  Filled 2018-02-13 (×2): qty 200

## 2018-02-13 MED ORDER — ACETAMINOPHEN 325 MG PO TABS
650.0000 mg | ORAL_TABLET | Freq: Four times a day (QID) | ORAL | Status: DC | PRN
  Administered 2018-02-18 – 2018-02-21 (×2): 650 mg via ORAL
  Filled 2018-02-13 (×2): qty 2

## 2018-02-13 MED ORDER — HEPARIN BOLUS VIA INFUSION
4000.0000 [IU] | Freq: Once | INTRAVENOUS | Status: AC
  Administered 2018-02-13: 4000 [IU] via INTRAVENOUS
  Filled 2018-02-13: qty 4000

## 2018-02-13 MED ORDER — PANTOPRAZOLE SODIUM 40 MG IV SOLR
40.0000 mg | INTRAVENOUS | Status: DC
  Administered 2018-02-13: 40 mg via INTRAVENOUS
  Filled 2018-02-13: qty 40

## 2018-02-13 MED ORDER — SODIUM CHLORIDE 0.9 % IV BOLUS
1000.0000 mL | Freq: Once | INTRAVENOUS | Status: AC
  Administered 2018-02-13: 1000 mL via INTRAVENOUS

## 2018-02-13 MED ORDER — PROPOFOL 10 MG/ML IV BOLUS
50.0000 mg | Freq: Once | INTRAVENOUS | Status: AC
  Administered 2018-02-13: 50 mg via INTRAVENOUS

## 2018-02-13 MED ORDER — NOREPINEPHRINE 4 MG/250ML-% IV SOLN
0.0000 ug/min | INTRAVENOUS | Status: DC
  Filled 2018-02-13: qty 250

## 2018-02-13 MED ORDER — OSELTAMIVIR PHOSPHATE 75 MG PO CAPS: 75.0000 mg | ORAL_CAPSULE | Freq: Once | ORAL | Status: DC

## 2018-02-13 MED ORDER — DOPAMINE-DEXTROSE 3.2-5 MG/ML-% IV SOLN
INTRAVENOUS | Status: AC
  Filled 2018-02-13: qty 250

## 2018-02-13 MED ORDER — ROCURONIUM BROMIDE 50 MG/5ML IV SOLN
80.0000 mg | Freq: Once | INTRAVENOUS | Status: AC
  Administered 2018-02-13: 80 mg via INTRAVENOUS

## 2018-02-13 MED ORDER — KETAMINE HCL 10 MG/ML IJ SOLN
100.0000 mg | Freq: Once | INTRAMUSCULAR | Status: AC
  Administered 2018-02-13: 100 mg via INTRAVENOUS

## 2018-02-13 MED ORDER — SODIUM CHLORIDE 0.9 % IV SOLN
2.0000 g | Freq: Once | INTRAVENOUS | Status: AC
  Administered 2018-02-13: 2 g via INTRAVENOUS
  Filled 2018-02-13: qty 2

## 2018-02-13 MED ORDER — METRONIDAZOLE IN NACL 5-0.79 MG/ML-% IV SOLN
500.0000 mg | Freq: Three times a day (TID) | INTRAVENOUS | Status: DC
  Administered 2018-02-13: 500 mg via INTRAVENOUS
  Filled 2018-02-13 (×3): qty 100

## 2018-02-13 MED ORDER — SODIUM CHLORIDE 0.9 % IV SOLN
2.0000 g | Freq: Two times a day (BID) | INTRAVENOUS | Status: AC
  Administered 2018-02-13 – 2018-02-17 (×9): 2 g via INTRAVENOUS
  Filled 2018-02-13 (×9): qty 2

## 2018-02-13 MED ORDER — ONDANSETRON HCL 4 MG PO TABS: 4.0000 mg | ORAL_TABLET | Freq: Four times a day (QID) | ORAL | Status: DC | PRN

## 2018-02-13 MED ORDER — FENTANYL CITRATE (PF) 100 MCG/2ML IJ SOLN: 150.0000 ug | Freq: Once | INTRAMUSCULAR | Status: DC

## 2018-02-13 MED ORDER — IOHEXOL 350 MG/ML SOLN
75.0000 mL | Freq: Once | INTRAVENOUS | Status: AC | PRN
  Administered 2018-02-13: 75 mL via INTRAVENOUS

## 2018-02-13 MED ORDER — FENTANYL BOLUS VIA INFUSION
150.0000 ug | Freq: Once | INTRAVENOUS | Status: AC
  Administered 2018-02-13: 150 ug via INTRAVENOUS

## 2018-02-13 MED ORDER — HEPARIN (PORCINE) 25000 UT/250ML-% IV SOLN
800.0000 [IU]/h | INTRAVENOUS | Status: DC
  Administered 2018-02-13: 800 [IU]/h via INTRAVENOUS
  Filled 2018-02-13: qty 250

## 2018-02-13 MED ORDER — HEPARIN (PORCINE) 25000 UT/250ML-% IV SOLN
850.0000 [IU]/h | INTRAVENOUS | Status: DC
  Administered 2018-02-13: 800 [IU]/h via INTRAVENOUS
  Administered 2018-02-14 – 2018-02-17 (×3): 950 [IU]/h via INTRAVENOUS
  Administered 2018-02-18 – 2018-02-21 (×4): 1000 [IU]/h via INTRAVENOUS
  Filled 2018-02-13 (×8): qty 250

## 2018-02-13 MED ORDER — FENTANYL 2500MCG IN NS 250ML (10MCG/ML) PREMIX INFUSION
0.0000 ug/h | INTRAVENOUS | Status: DC
  Administered 2018-02-13: 100 ug/h via INTRAVENOUS
  Administered 2018-02-13: 50 ug/h via INTRAVENOUS
  Administered 2018-02-14: 150 ug/h via INTRAVENOUS
  Administered 2018-02-15 – 2018-02-19 (×3): 50 ug/h via INTRAVENOUS
  Administered 2018-02-20 – 2018-02-21 (×3): 150 ug/h via INTRAVENOUS
  Administered 2018-02-22: 400 ug/h via INTRAVENOUS
  Filled 2018-02-13 (×11): qty 250

## 2018-02-13 MED ORDER — DOCUSATE SODIUM 100 MG PO CAPS
100.0000 mg | ORAL_CAPSULE | Freq: Two times a day (BID) | ORAL | Status: DC
  Administered 2018-02-14 (×2): 100 mg via ORAL
  Filled 2018-02-13 (×2): qty 1

## 2018-02-13 MED ORDER — ORAL CARE MOUTH RINSE
15.0000 mL | OROMUCOSAL | Status: DC
  Administered 2018-02-13 – 2018-02-22 (×85): 15 mL via OROMUCOSAL

## 2018-02-13 NOTE — H&P (Signed)
Adam Roberson is an 84 y.o. male.   Chief Complaint: Shortness of breath HPI: The patient with past medical history of CAD, hypertension, atrial fibrillation and diastolic heart failure presents to the emergency department via EMS on BiPAP due to shortness of breath.  Patient was satting 60% on room air which prompted intubation.  2 days ago the patient complained of chest pain that radiated into his left arm.  His family reports that his dyspnea worsened since that time.  The patient was also febrile to 100.6 F upon arrival.  Blood cultures were obtained and broad-spectrum antibiotics initiated prior to the emergency department staff calling the hospitalist service for admission.  Past Medical History:  Diagnosis Date  . CAD (coronary artery disease)    a.2003 [place] CAB[place] x3 in [place]; b. ? 2014 PCI (records not available).  . Chronic diastolic CHF (congestive heart failure) (HCC)    a. 06/2015 Echo: EF 60-65%, mod dil LA, nl RV, mild to mod TR, PASP 46mmHg.  Marland Kitchen. DVT (deep venous thrombosis) (HCC)   . DVT of leg (deep venous thrombosis) (HCC)   . Dysrhythmia   . H/O: CVA (cerebrovascular accident)   . HOH (hard of hearing)   . Hyperlipidemia   . Hypertension   . Hypertensive heart disease   . Parkinson disease (HCC)   . Permanent atrial fibrillation (HCC)    a. CHA2DS2VASc= 7-->coumadin (followed by primary care).  . Pneumonia    4/17  . Stroke [facility](HCC)     Past Surgical History:  Procedure Laterality Date  . CARDIAC SURGERY  age 89   bypass  . CATARACT EXTRACTION W/PHACO Left 08/27/2015   Procedure: CATARACT EXTRACTION PHACO AND INTRAOCULAR LENS PLACEMENT (IOC);  Surgeon: Lockie Molahadwick Brasington, MD;  Location: [facility] ORS;  Service: Ophthalmology;  Laterality: Left;  US 00:56 AP% 11.6 CDE 6.45 fluid pack lot # [id number]  . CATARACT EXTRACTION W/PHACO Right 11/05/2015   Procedure: CATARACT EXTRACTION PHACO AND INTRAOCULAR LENS PLACEMENT (IOC);  Surgeon: Lockie Molahadwick Brasington, MD;  Location:  [facility] ORS;  Service: Ophthalmology;  Laterality: Right;  US 01:06 AP% 14.3 CDE 9.46 Fluid Pack lot # [id number]  . CORONARY ARTERY BYPASS GRAFT    . dvt    . EYE SURGERY    . LEG SURGERY Right     Family History  Problem Relation Age of Onset  . CAD Mother    Social History:  reports that he has quit smoking. He has never used smokeless tobacco. He reports that he does not drink alcohol or use drugs.  Allergies: No Known Allergies  Prior to Admission medications   Medication Sig Start Date End Date Taking? Authorizing Provider  ALPRAZolam Prudy Feeler(XANAX) 0.5 MG tablet Take 1 tablet (0.5 mg total) by mouth at bedtime as needed for anxiety. Takes 3 times weekly. 08/20/17   Enedina FinnerPatel, Sona, MD  atorvastatin (LIPITOR) 20 MG tablet Take 1 tablet (20 mg total) by mouth daily. 02/03/17   Virl Axehaplin, Don C, MD  carbidopa-levodopa (SINEMET IR) 25-250 MG tablet TAKE ONE TABLET BY MOUTH 3 TIMES A DAY 12/22/17   Virl Axehaplin, Don C, MD  diltiazem (CARDIZEM CD) 120 MG 24 hr capsule TAKE ONE CAPSULE BY MOUTH EVERY DAY 10/28/17   Virl Axehaplin, Don C, MD  donepezil (ARICEPT) 10 MG tablet Take 5 mg by mouth at bedtime.    [provider]  furosemide (LASIX) 40 MG tablet Take 1 tablet (40 mg total) by mouth daily. 08/11/17 08/11/18  Virl Axehaplin, Don C, MD  ibuprofen (ADVIL,MOTRIN) 600  MG tablet Take 1 tablet (600 mg total) by mouth 3 (three) times daily. for 3-4 days with food 08/20/17   Enedina Finner, MD  metoprolol tartrate (LOPRESSOR) 50 MG tablet TAKE ONE TABLET BY MOUTH 2 TIMES A DAY 12/22/17   Doles-Johnson, Teah, NP  risperiDONE (RISPERDAL) 1 MG tablet Take 1 tablet (1 mg total) by mouth 2 (two) times daily. 08/20/17   Enedina Finner, MD  traZODone (DESYREL) 50 MG tablet Take 1 tablet (50 mg total) by mouth at bedtime. 08/20/17   Enedina Finner, MD  warfarin (COUMADIN) 5 MG tablet Take 2.5 mg on sat and Sunday and then resume the routine  With Taking 1/2 Tablet on Monday and Friday. Take 1 Tablet on Tuesday, Wednesday, Thursday, Saturday  and Sunday or as directed. 08/20/17   Enedina Finner, MD     Results for orders placed or performed during the hospital encounter of 03/09/2018 (from the past 48 hour(s))  Influenza panel by PCR (type A & B)     Status: None   Collection Time: 02/23/2018  5:27 AM  Result Value Ref Range   Influenza A By PCR NEGATIVE NEGATIVE   Influenza B By PCR NEGATIVE NEGATIVE    Comment: (NOTE) The Xpert Xpress Flu assay is intended as an aid in the diagnosis of  influenza and should not be used as a sole basis for treatment.  This  assay is FDA approved for nasopharyngeal swab specimens only. Nasal  washings and aspirates are unacceptable for Xpert Xpress Flu testing. Performed at [facility], [address] [place], [place] [address]   Comprehensive metabolic panel     Status: Abnormal   Collection Time: 02/15/2018  5:27 AM  Result Value Ref Range   Sodium 138 135 - 145 mmol/L   Potassium 3.3 (L) 3.5 - 5.1 mmol/L   Chloride 100 98 - 111 mmol/L   CO2 27 22 - 32 mmol/L   Glucose, Bld 172 (H) 70 - 99 mg/dL   BUN 20 8 - 23 mg/dL   Creatinine, Ser 6.04 0.61 - 1.24 mg/dL   Calcium 8.7 (L) 8.9 - 10.3 mg/dL   Total Protein 7.7 6.5 - 8.1 g/dL   Albumin 3.6 3.5 - 5.0 g/dL   AST 540 (H) 15 - 41 U/L   ALT 17 0 - 44 U/L   Alkaline Phosphatase 86 38 - 126 U/L   Total Bilirubin 0.6 0.3 - 1.2 mg/dL   GFR calc non Af Amer >60 >60 mL/min   GFR calc Af Amer >60 >60 mL/min   Anion gap 11 5 - 15    Comment: Performed at [facility], [address] [place], [place] [address]  Lipase, blood     Status: None   Collection Time: 02/21/2018  5:27 AM  Result Value Ref Range   Lipase 45 11 - 51 U/L    Comment: Performed at [facility], [address] [place], [place] [address]  Troponin I - ONCE - STAT     Status: Abnormal   Collection Time: 03/05/2018  5:27 AM  Result Value Ref Range   Troponin I 26.62 (HH) <0.03 ng/mL    Comment: CRITICAL RESULT CALLED TO, READ BACK BY AND VERIFIED  WITH  [facility] [place] AT 9562 03/08/2018 SDR Performed at [facility], [address], [place], [place] [address]   CBC WITH DIFFERENTIAL     Status: Abnormal   Collection Time: 03/15/2018  5:27 AM  Result Value Ref Range  WBC 12.5 (H) 4.0 - 10.5 K/uL   RBC 5.06 4.22 - 5.81 MIL/uL   Hemoglobin 14.9 13.0 - 17.0 g/dL   HCT 16.1 09.6 - 04.5 %   MCV 89.7 80.0 - 100.0 fL   MCH 29.4 26.0 - 34.0 pg   MCHC 32.8 30.0 - 36.0 g/dL   RDW 40.9 81.1 - 91.4 %   Platelets 253 150 - 400 K/uL   nRBC 0.0 0.0 - 0.2 %   Neutrophils Relative % 87 %   Neutro Abs 10.9 (H) 1.7 - 7.7 K/uL   Lymphocytes Relative 6 %   Lymphs Abs 0.8 0.7 - 4.0 K/uL   Monocytes Relative 5 %   Monocytes Absolute 0.7 0.1 - 1.0 K/uL   Eosinophils Relative 0 %   Eosinophils Absolute 0.0 0.0 - 0.5 K/uL   Basophils Relative 1 %   Basophils Absolute 0.1 0.0 - 0.1 K/uL   Immature Granulocytes 1 %   Abs Immature Granulocytes 0.07 0.00 - 0.07 K/uL    Comment: Performed at [facility], [address] [place], [place] [address]  Procalcitonin     Status: None   Collection Time: 02/23/2018  5:27 AM  Result Value Ref Range   Procalcitonin <0.10 ng/mL    Comment:        Interpretation: PCT (Procalcitonin) <= 0.5 ng/mL: Systemic infection (sepsis) is not likely. Local bacterial infection is possible. (NOTE)       Sepsis PCT Algorithm           Lower Respiratory Tract                                      Infection PCT Algorithm    ----------------------------     ----------------------------         PCT < 0.25 ng/mL                PCT < 0.10 ng/mL         Strongly encourage             Strongly discourage   discontinuation of antibiotics    initiation of antibiotics    ----------------------------     -----------------------------       PCT 0.25 - 0.50 ng/mL            PCT 0.10 - 0.25 ng/mL               OR       >80% decrease in PCT            Discourage initiation of                                             antibiotics      Encourage discontinuation           of antibiotics    ----------------------------     -----------------------------         PCT >= 0.50 ng/mL              PCT 0.26 - 0.50 ng/mL               AND        <80% decrease in PCT             Encourage initiation of  antibiotics       Encourage continuation           of antibiotics    ----------------------------     -----------------------------        PCT >= 0.50 ng/mL                  PCT > 0.50 ng/mL               AND         increase in PCT                  Strongly encourage                                      initiation of antibiotics    Strongly encourage escalation           of antibiotics                                     -----------------------------                                           PCT <= 0.25 ng/mL                                                 OR                                        > 80% decrease in PCT                                     Discontinue / Do not initiate                                             antibiotics Performed at [facility], [address] [place], [place] [address]   Urinalysis, Routine w reflex microscopic     Status: Abnormal   Collection Time: 2018/02/15  5:27 AM  Result Value Ref Range   Color, Urine YELLOW (A) YELLOW   APPearance HAZY (A) CLEAR   Specific Gravity, Urine 1.021 1.005 - 1.030   pH 5.0 5.0 - 8.0   Glucose, UA 50 (A) NEGATIVE mg/dL   Hgb urine dipstick MODERATE (A) NEGATIVE   Bilirubin Urine NEGATIVE NEGATIVE   Ketones, ur 5 (A) NEGATIVE mg/dL   Protein, ur 604 (A) NEGATIVE mg/dL   Nitrite NEGATIVE NEGATIVE   Leukocytes, UA NEGATIVE NEGATIVE   RBC / HPF 6-10 0 - 5 RBC/hpf   WBC, UA 0-5 0 - 5 WBC/hpf   Bacteria, UA NONE SEEN NONE SEEN   Squamous Epithelial / LPF 0-5 0 - 5   Mucus PRESENT    Hyaline Casts, UA PRESENT     Comment: Performed at [facility], [address],  [place], [place] [address]  CG4 I-STAT (Lactic acid)     Status: Abnormal   Collection Time: 2018-03-04  5:43 AM  Result Value Ref Range   Lactic Acid, Venous 2.85 (HH) 0.5 - 1.9 mmol/L   Comment MD NOTIFIED, REPEAT TEST   Blood gas, arterial     Status: Abnormal   Collection Time: Mar 04, 2018  5:53 AM  Result Value Ref Range   FIO2 60.00    Delivery systems VENTILATOR    Mode ASSIST CONTROL    VT 500 mL   LHR 16.0 resp/min   Peep/cpap 5.0 cm H20   pH, Arterial 7.47 (H) 7.350 - 7.450   pCO2 arterial 35 32.0 - 48.0 mmHg   pO2, Arterial 245 (H) 83.0 - 108.0 mmHg   Bicarbonate 25.5 20.0 - 28.0 mmol/L   Acid-Base Excess 2.1 (H) 0.0 - 2.0 mmol/L   O2 Saturation 99.9 %   Patient temperature 37.0    Collection site LEFT RADIAL    Sample type ARTERIAL DRAW    Allens test (pass/fail) PASS PASS    Comment: Performed at [facility], [address] [place], [place] [address]   Dg Chest Port 1 View  Result Date: 03-04-18 CLINICAL DATA:  Initial evaluation for sepsis, post intubation. EXAM: PORTABLE CHEST 1 VIEW COMPARISON:  Prior radiograph from 08/17/2017 FINDINGS: Endotracheal tube in place with tip position approximately 15 mm above the carina. Enteric tube courses into the abdomen with side hole beyond the GE junction. Median sternotomy wires underlying surgical clips noted, stable. Cardiomegaly unchanged. Mediastinal silhouette normal. Aortic atherosclerosis. Lungs are mildly hypoinflated. Diffuse pulmonary vascular and interstitial congestion, suggesting pulmonary interstitial edema. There are superimposed confluent multifocal opacities at the right mid and lower lung as well as the left lung base, suspicious for infiltrates. Associated small right pleural effusion. No pneumothorax. No acute osseus abnormality. IMPRESSION: 1. Tip of the endotracheal tube approximately 15 mm above the carina. 2. Cardiomegaly with diffuse pulmonary vascular and interstitial congestion, suggesting pulmonary  interstitial edema. 3. Superimposed multifocal opacities at the right mid and lower lung and left lung base, suspicious for infiltrates. 4. Small right pleural effusion. Electronically Signed   By: Rise Mu M.D.   On: 2018/03/04 05:57    Review of Systems  Unable to perform ROS: Intubated    Blood pressure 111/80, pulse 89, temperature (!) 100.6 F (38.1 C), temperature source Rectal, resp. rate (!) 24, height 5\' 8"  (1.727 m), weight 66.2 kg, SpO2 99 %. Physical Exam  Constitutional: He appears well-developed and well-nourished. He appears distressed. He is intubated.  HENT:  Head: Normocephalic and atraumatic.  Mouth/Throat: Oropharynx is clear and moist.  Eyes: Pupils are equal, round, and reactive to light. Conjunctivae and EOM are normal. No scleral icterus.  Neck: Normal range of motion. Neck supple. No JVD present. No tracheal deviation present. No thyromegaly present.  Cardiovascular: Normal rate. Exam reveals no gallop and no friction rub.  No murmur heard. Respiratory: He is intubated. He has no wheezes. He has no rales.  GI: Soft. Bowel sounds are normal. He exhibits no distension. There is no tenderness.  Genitourinary:  Genitourinary Comments: Deferred  Musculoskeletal: He exhibits no edema.  Patient is intubated and sedated  Lymphadenopathy:    He has no cervical adenopathy.  Neurological: No cranial nerve deficit.  Skin: He is diaphoretic. There is pallor.  Psychiatric:  Intubated and sedated     Assessment/Plan  This is an 84 year old male admitted  for respiratory failure. 1.  Respiratory failure: Acute; with hypoxia and sepsis.  Meets criteria for ARDS.  The patient is intubated and sedated.  Propofol rate decreased due to hypotension.  FiO2 100%.  PaO2 greater than 200.  Decrease supplemental oxygen.  Continue broad-spectrum antibiotics.  Recheck lactic acid after volume expansion.  AA gradient is 569 2.  Cardiogenic shock: Secondary to acute coronary  syndrome.  Troponin is 26; start heparin drip.  I have started the patient on dopamine. 3.  CAD: Unstable; anticoagulation as above. 4.  Atrial fibrillation: Rate controlled; resume antiarrhythmics and the patient was stabilized 5.  DVT prophylaxis: Full dose anticoagulation 6.  GI prophylaxis: Pantoprazole following 24 hours intubation The patient is a full code.  I have personally spent 45 minutes of critical care time with this patient  Arnaldo Natal, MD Feb 23, 2018, 6:46 AM

## 2018-02-13 NOTE — Progress Notes (Signed)
Spoke with Dr. Duanne LimerickSamaan regarding patients PTT of greater then 160. He does have blood tinge oral and subglottic secretions. Order to stop heparin drip. Will re check ptt in 4 hours.

## 2018-02-13 NOTE — ED Notes (Signed)
ED TO INPATIENT HANDOFF REPORT  Name/Age/Gender Adam Roberson 84 y.o. male  Code Status Code Status History    Date Active Date Inactive Code Status Order ID Comments User Context   08/18/2017 0340 08/20/2017 1945 Full Code [id number]  Arnaldo Natal, MD Inpatient   06/28/2015 2331 07/05/2015 1644 Full Code [id number]  Enid Baas, MD Inpatient      Home/SNF/Other To be determined  Chief Complaint Respiratory Distress  Level of Care/Admitting Diagnosis ED Disposition    ED Disposition Condition Comment   Admit  Hospital Area: [facility] [[id number]]  Level of Care: ICU [6]  Diagnosis: Sepsis [facility]) [[id number]]  Admitting Physician: Arnaldo Natal [[id number]]  Attending Physician: Arnaldo Natal [[id number]]  Estimated length of stay: past midnight tomorrow  Certification:: I certify this patient will need inpatient services for at least 2 midnights  PT Class (Do Not Modify): Inpatient [101]  PT Acc Code (Do Not Modify): Private [1]       Medical History Past Medical History:  Diagnosis Date  . CAD (coronary artery disease)    a.2003 s/p CABG x3 in [place]; b. ? 2014 PCI (records not available).  . Chronic diastolic CHF (congestive heart failure) (HCC)    a. 06/2015 Echo: EF 60-65%, mod dil LA, nl RV, mild to mod TR, PASP .  Marland Kitchen DVT (deep venous thrombosis) (HCC)   . DVT of leg (deep venous thrombosis) (HCC)   . Dysrhythmia   . H/O: CVA (cerebrovascular accident)   . HOH (hard of hearing)   . Hyperlipidemia   . Hypertension   . Hypertensive heart disease   . Parkinson disease (HCC)   . Permanent atrial fibrillation (HCC)    a. CHA2DS2VASc= 7-->coumadin (followed by primary care).  . Pneumonia    4/17  . Stroke [facility])     Allergies No Known Allergies  IV Location/Drains/Wounds Patient Lines/Drains/Airways Status   Active Line/Drains/Airways    Name:   Placement date:   Placement time:   Site:   Days:   Peripheral IV 02/16/2018  Right Hand   02/21/2018    0529    Hand   less than 1   Peripheral IV 02/16/2018 Left Antecubital   03/07/2018    0545    Antecubital   less than 1   Peripheral IV 03/09/2018 Left Forearm   03/11/2018    0556    Forearm   less than 1   NG/OG Tube Orogastric 18 Fr. Center mouth Xray;Aucultation Documented cm marking at nare/ corner of mouth 60 cm   03/07/2018    0557    Center mouth   less than 1   Urethral Catheter April RN Straight-tip 16 Fr.   02/19/2018    0558    Straight-tip   less than 1   Airway   -    -        Airway 8 mm   02/14/2018    0535     less than 1   Incision (Closed) 08/27/15 Eye Left   08/27/15    0657     901   Incision (Closed) 11/05/15 Eye Right   11/05/15    0706     831          Labs/Imaging Results for orders placed or performed during the hospital encounter of 03/06/2018 (from the past 48 hour(s))  Influenza panel by PCR (type A & B)     Status: None   Collection Time: 03/12/2018  5:27 AM  Result Value Ref Range   Influenza A By PCR NEGATIVE NEGATIVE   Influenza B By PCR NEGATIVE NEGATIVE    Comment: (NOTE) The Xpert Xpress Flu assay is intended as an aid in the diagnosis of  influenza and should not be used as a sole basis for treatment.  This  assay is FDA approved for nasopharyngeal swab specimens only. Nasal  washings and aspirates are unacceptable for Xpert Xpress Flu testing. Performed at [facility], [address] [place], [place] [address]   Comprehensive metabolic panel     Status: Abnormal   Collection Time: 03-03-2018  5:27 AM  Result Value Ref Range   Sodium 138 135 - 145 mmol/L   Potassium 3.3 (L) 3.5 - 5.1 mmol/L   Chloride 100 98 - 111 mmol/L   CO2 27 22 - 32 mmol/L   Glucose, Bld 172 (H) 70 - 99 mg/dL   BUN 20 8 - 23 mg/dL   Creatinine, Ser 6.04 0.61 - 1.24 mg/dL   Calcium 8.7 (L) 8.9 - 10.3 mg/dL   Total Protein 7.7 6.5 - 8.1 g/dL   Albumin 3.6 3.5 - 5.0 g/dL   AST 540 (H) 15 - 41 U/L   ALT 17 0 - 44 U/L   Alkaline Phosphatase 86 38 - 126  U/L   Total Bilirubin 0.6 0.3 - 1.2 mg/dL   GFR calc non Af Amer >60 >60 mL/min   GFR calc Af Amer >60 >60 mL/min   Anion gap 11 5 - 15    Comment: Performed at [facility], [address] [place], [place] [address]  Lipase, blood     Status: None   Collection Time: 03/03/18  5:27 AM  Result Value Ref Range   Lipase 45 11 - 51 U/L    Comment: Performed at [facility], [address] [place], [place] [address]  Troponin I - ONCE - STAT     Status: Abnormal   Collection Time: 2018/03/03  5:27 AM  Result Value Ref Range   Troponin I 26.62 (HH) <0.03 ng/mL    Comment: CRITICAL RESULT CALLED TO, READ BACK BY AND VERIFIED WITH  [facility] KING AT 9562 03/03/18 SDR Performed at [facility], [address] [place], [place] [address]   CBC WITH DIFFERENTIAL     Status: Abnormal   Collection Time: 03/03/2018  5:27 AM  Result Value Ref Range   WBC 12.5 (H) 4.0 - 10.5 K/uL   RBC 5.06 4.22 - 5.81 MIL/uL   Hemoglobin 14.9 13.0 - 17.0 g/dL   HCT 57.8 46.9 - 62.9 %   MCV 89.7 80.0 - 100.0 fL   MCH 29.4 26.0 - 34.0 pg   MCHC 32.8 30.0 - 36.0 g/dL   RDW 52.8 41.3 - 24.4 %   Platelets 253 150 - 400 K/uL   nRBC 0.0 0.0 - 0.2 %   Neutrophils Relative % 87 %   Neutro Abs 10.9 (H) 1.7 - 7.7 K/uL   Lymphocytes Relative 6 %   Lymphs Abs 0.8 0.7 - 4.0 K/uL   Monocytes Relative 5 %   Monocytes Absolute 0.7 0.1 - 1.0 K/uL   Eosinophils Relative 0 %   Eosinophils Absolute 0.0 0.0 - 0.5 K/uL   Basophils Relative 1 %   Basophils Absolute 0.1 0.0 - 0.1 K/uL   Immature Granulocytes 1 %   Abs Immature Granulocytes 0.07 0.00 - 0.07 K/uL    Comment: Performed at [facility],  [address] [place], [place] [address]  Procalcitonin     Status: None   Collection Time: 02/17/2018  5:27 AM  Result Value Ref Range   Procalcitonin <0.10 ng/mL    Comment:        Interpretation: PCT (Procalcitonin) <= 0.5 ng/mL: Systemic infection (sepsis) is not likely. Local bacterial  infection is possible. (NOTE)       Sepsis PCT Algorithm           Lower Respiratory Tract                                      Infection PCT Algorithm    ----------------------------     ----------------------------         PCT < 0.25 ng/mL                PCT < 0.10 ng/mL         Strongly encourage             Strongly discourage   discontinuation of antibiotics    initiation of antibiotics    ----------------------------     -----------------------------       PCT 0.25 - 0.50 ng/mL            PCT 0.10 - 0.25 ng/mL               OR       >80% decrease in PCT            Discourage initiation of                                            antibiotics      Encourage discontinuation           of antibiotics    ----------------------------     -----------------------------         PCT >= 0.50 ng/mL              PCT 0.26 - 0.50 ng/mL               AND        <80% decrease in PCT             Encourage initiation of                                             antibiotics       Encourage continuation           of antibiotics    ----------------------------     -----------------------------        PCT >= 0.50 ng/mL                  PCT > 0.50 ng/mL               AND         increase in PCT                  Strongly encourage  initiation of antibiotics    Strongly encourage escalation           of antibiotics                                     -----------------------------                                           PCT <= 0.25 ng/mL                                                 OR                                        > 80% decrease in PCT                                     Discontinue / Do not initiate                                             antibiotics Performed at [facility], [address] [place], [place] [address]   Blood Culture (routine x 2)     Status: None (Preliminary result)   Collection Time: 02/16/2018  5:27 AM  Result  Value Ref Range   Specimen Description BLOOD RIGHT HAND    Special Requests      BOTTLES DRAWN AEROBIC AND ANAEROBIC Blood Culture adequate volume   Culture      NO GROWTH <12 HOURS Performed at [facility], [address] [place], [place] [address]    Report Status PENDING   Blood Culture (routine x 2)     Status: None (Preliminary result)   Collection Time: 02/27/2018  5:27 AM  Result Value Ref Range   Specimen Description BLOOD LEFT FOREARM    Special Requests      BOTTLES DRAWN AEROBIC AND ANAEROBIC Blood Culture adequate volume   Culture      NO GROWTH <12 HOURS Performed at [facility], [address], [place], [place] [address]    Report Status PENDING   Urinalysis, Routine w reflex microscopic     Status: Abnormal   Collection Time: 02/20/2018  5:27 AM  Result Value Ref Range   Color, Urine YELLOW (A) YELLOW   APPearance HAZY (A) CLEAR   Specific Gravity, Urine 1.021 1.005 - 1.030   pH 5.0 5.0 - 8.0   Glucose, UA 50 (A) NEGATIVE mg/dL   Hgb urine dipstick MODERATE (A) NEGATIVE   Bilirubin Urine NEGATIVE NEGATIVE   Ketones, ur 5 (A) NEGATIVE mg/dL   Protein, ur 914 (A) NEGATIVE mg/dL   Nitrite NEGATIVE NEGATIVE   Leukocytes, UA NEGATIVE NEGATIVE   RBC / HPF 6-10 0 - 5 RBC/hpf   WBC, UA 0-5 0 - 5 WBC/hpf   Bacteria, UA NONE SEEN NONE SEEN   Squamous Epithelial /  LPF 0-5 0 - 5   Mucus PRESENT    Hyaline Casts, UA PRESENT     Comment: Performed at [facility], [address] [place], [place] [address]  CG4 I-STAT (Lactic acid)     Status: Abnormal   Collection Time: 02/14/2018  5:43 AM  Result Value Ref Range   Lactic Acid, Venous 2.85 (HH) 0.5 - 1.9 mmol/L   Comment MD NOTIFIED, REPEAT TEST   Blood gas, arterial     Status: Abnormal   Collection Time: 03/06/2018  5:53 AM  Result Value Ref Range   FIO2 60.00    Delivery systems VENTILATOR    Mode ASSIST CONTROL    VT 500 mL   LHR 16.0 resp/min   Peep/cpap 5.0 cm H20   pH, Arterial  7.47 (H) 7.350 - 7.450   pCO2 arterial 35 32.0 - 48.0 mmHg   pO2, Arterial 245 (H) 83.0 - 108.0 mmHg   Bicarbonate 25.5 20.0 - 28.0 mmol/L   Acid-Base Excess 2.1 (H) 0.0 - 2.0 mmol/L   O2 Saturation 99.9 %   Patient temperature 37.0    Collection site LEFT RADIAL    Sample type ARTERIAL DRAW    Allens test (pass/fail) PASS PASS    Comment: Performed at [facility], [address], [place], [place] [address]   Dg Chest Port 1 View  Result Date: 03/09/2018 CLINICAL DATA:  Initial evaluation for sepsis, post intubation. EXAM: PORTABLE CHEST 1 VIEW COMPARISON:  Prior radiograph from 08/17/2017 FINDINGS: Endotracheal tube in place with tip position approximately 15 mm above the carina. Enteric tube courses into the abdomen with side hole beyond the GE junction. Median sternotomy wires underlying surgical clips noted, stable. Cardiomegaly unchanged. Mediastinal silhouette normal. Aortic atherosclerosis. Lungs are mildly hypoinflated. Diffuse pulmonary vascular and interstitial congestion, suggesting pulmonary interstitial edema. There are superimposed confluent multifocal opacities at the right mid and lower lung as well as the left lung base, suspicious for infiltrates. Associated small right pleural effusion. No pneumothorax. No acute osseus abnormality. IMPRESSION: 1. Tip of the endotracheal tube approximately 15 mm above the carina. 2. Cardiomegaly with diffuse pulmonary vascular and interstitial congestion, suggesting pulmonary interstitial edema. 3. Superimposed multifocal opacities at the right mid and lower lung and left lung base, suspicious for infiltrates. 4. Small right pleural effusion. Electronically Signed   By: Rise Mu M.D.   On: 03/15/2018 05:57    Pending Labs Unresulted Labs (From admission, onward)    Start     Ordered   02/23/2018 0651  APTT  ONCE - STAT,   STAT     03/01/2018 0651   03/10/2018 0624  Lactic acid, plasma  Now then every 2 hours,   STAT      03/08/2018 0623   03/11/2018 0605  Protime-INR  Once,   R     02/17/2018 0605   03/13/2018 0524  Urine culture  Add-on,   AD     02/17/2018 0523   Signed and Held  TSH  Add-on,   R     Signed and Held          Vitals/Pain Today's Vitals   03/07/2018 0525 02/26/2018 0530 02/21/2018 0536 03/11/2018 0700  BP:  111/80  104/80  Pulse: 89   76  Resp: (!) 35 (!) 24  16  Temp:      TempSrc:      SpO2: 99%  99% 97%  Weight:      Height:  Isolation Precautions No active isolations  Medications Medications  metroNIDAZOLE (FLAGYL) IVPB 500 mg (500 mg Intravenous New Bag/Given 02/20/2018 0637)  vancomycin (VANCOCIN) IVPB 1000 mg/200 mL premix (has no administration in time range)  acetaminophen (OFIRMEV) IV 1,000 mg (1,000 mg Intravenous New Bag/Given 02/28/2018 0739)  fentaNYL 2500mcg in NS 250mL (710mcg/ml) infusion-PREMIX (100 mcg/hr Intravenous New Bag/Given 03/01/2018 0550)  propofol (DIPRIVAN) 1000 MG/100ML infusion (10 mcg/kg/min  66.2 kg Intravenous Rate/Dose Change 03/12/2018 0649)  oseltamivir (TAMIFLU) capsule 75 mg (0 mg Oral Hold 03/15/2018 0720)  DOPamine (INTROPIN) 800 mg in dextrose 5 % 250 mL (3.2 mg/mL) infusion (0 mcg/kg/min  66.2 kg Intravenous Paused 02/16/2018 0705)  heparin ADULT infusion 100 units/mL (25000 units/28050mL sodium chloride 0.45%) (800 Units/hr Intravenous New Bag/Given 03/14/2018 0735)  ceFEPIme (MAXIPIME) 2 g in sodium chloride 0.9 % 100 mL IVPB (has no administration in time range)  ceFEPIme (MAXIPIME) 2 g in sodium chloride 0.9 % 100 mL IVPB (0 g Intravenous Stopped 02/16/2018 0636)  sodium chloride 0.9 % bolus 1,000 mL (0 mLs Intravenous Stopped 02/24/2018 0701)  fentaNYL (SUBLIMAZE) injection 150 mcg (150 mcg Intravenous Given 02/26/2018 0533)  ketamine (KETALAR) injection 100 mg (100 mg Intravenous Given 03/08/2018 0532)  rocuronium (ZEMURON) injection 80 mg (80 mg Intravenous Given 03/07/2018 0534)  sodium chloride 0.9 % bolus 1,000 mL (0 mLs Intravenous Stopped 03/13/2018 0703)  propofol  (DIPRIVAN) 10 mg/mL bolus/IV push 50 mg (50 mg Intravenous Given by Other 02/26/2018 0607)  fentaNYL (SUBLIMAZE) bolus via infusion 150 mcg (150 mcg Intravenous Bolus from Bag 03/12/2018 0607)  heparin bolus via infusion 4,000 Units (4,000 Units Intravenous Bolus from Bag 03/02/2018 0736)    Mobility Total care - on ventilator with IV sedation

## 2018-02-13 NOTE — Consult Note (Signed)
Pharmacy Antibiotic Note  Adam Roberson is a 84 y.o. male admitted on 03/13/2018 with sepsis.  Pharmacy has been consulted for Vancomycin and Cefepime dosing. Patient received vancomycin 1g IV x 1 dose and cefepime 2g IV x 1 dose in ED.   Plan: Start Vancomycin 1000 IV every 18 hours with 6 hour stack dosing.  Goal trough 15-20 mcg/mL. Calculated trough @ Css is 18. Trough level ordered prior to 4th dose.   Start Cefepime 2g IV every 12 hours.    Height: 5\' 6"  (167.6 cm) Weight: 136 lb 11 oz (62 kg) IBW/kg (Calculated) : 63.8  Temp (24hrs), Avg:99.4 F (37.4 C), Min:98.2 F (36.8 C), Max:100.6 F (38.1 C)  Recent Labs  Lab 03/13/2018 0527 02/20/2018 0543  WBC 12.5*  --   CREATININE 0.96  --   LATICACIDVEN  --  2.85*    Estimated Creatinine Clearance: 47.5 mL/min (by C-G formula based on SCr of 0.96 mg/dL).    No Known Allergies  Antimicrobials this admission: 12/1 cefepime >>  12/1 Vancomycin >>   Dose adjustments this admission:  Microbiology results: 12/1 BCx: pending 12/1 UCx: pending 12/1  MRSA PCR: pending  Thank you for allowing pharmacy to be a part of this patient's care.  Gardner CandleSheema M Valrie Jia, PharmD, BCPS Clinical Pharmacist 03/06/2018 8:34 AM

## 2018-02-13 NOTE — ED Provider Notes (Signed)
[facility] Emergency Department Provider Note  ____________________________________________   First MD Initiated Contact with Patient 02/21/2018 [phone]     (approximate)  I have reviewed the triage vital signs and the nursing notes.   HISTORY  Chief Complaint No chief complaint on file.  Level 5 exemption history is limited by the patient's clinical condition  HPI Adam Roberson is a 83 y.o. male who comes to the emergency department via EMS in severe respiratory distress.  According to EMS the patient has been sick for less than 24 hours when they arrived they found him saturating in the 83s.  The call was for respiratory distress.  They placed the patient on BiPAP and he was saturating in the mid 90s.  They are unable to obtain any further history.  The patient himself is obtunded and cannot provide any history.    Past Medical History:  Diagnosis Date  . CAD (coronary artery disease)    a.2003 s/p CABG x3 in [place]; b. ? 2014 PCI (records not available).  . Chronic diastolic CHF (congestive heart failure) ([place])    a. 06/2015 Echo: EF 60-65%, mod dil LA, nl RV, mild to mod TR, PASP 28mHg.  .Marland KitchenDVT (deep venous thrombosis) ([place]   . DVT of leg (deep venous thrombosis) ([place]   . Dysrhythmia   . H/O: CVA (cerebrovascular accident)   . HOH (hard of hearing)   . Hyperlipidemia   . Hypertension   . Hypertensive heart disease   . Parkinson disease ([place]   . Permanent atrial fibrillation (HCC)    a. CHA2DS2VASc= 7-->coumadin (followed by primary care).  . Pneumonia    4/17  . Stroke ([facility]     Patient Active Problem List   Diagnosis Date Noted  . HCAP (healthcare-associated pneumonia) 08/18/2017  . Hypertensive heart disease   . Hyperlipidemia   . CAD (coronary artery disease)   . Permanent atrial fibrillation   . Atrial fibrillation with rapid ventricular response ([place] 07/03/2015  . Bilateral pneumonia   . Dyspnea   . Persistent atrial  fibrillation   . Acute on chronic diastolic CHF (congestive heart failure) ([place]   . Sepsis ([place] 06/28/2015  . Parkinson disease ([place] 04/09/2015  . IHD (ischemic heart disease) 10/31/2014    Past Surgical History:  Procedure Laterality Date  . CARDIAC SURGERY  age 84  bypass  . CATARACT EXTRACTION W/PHACO Left 08/27/2015   Procedure: CATARACT EXTRACTION PHACO AND INTRAOCULAR LENS PLACEMENT (IOC);  Surgeon: CLeandrew Koyanagi MD;  Location: [facility] ORS;  Service: Ophthalmology;  Laterality: Left;  [place]:56 AP% 11.6 CDE 6.45 fluid pack lot # [id number]  . CATARACT EXTRACTION W/PHACO Right 11/05/2015   Procedure: CATARACT EXTRACTION PHACO AND INTRAOCULAR LENS PLACEMENT (IOC);  Surgeon: CLeandrew Koyanagi MD;  Location: [facility] ORS;  Service: Ophthalmology;  Laterality: Right;  [place]:06 AP% 14.3 CDE 9.46 Fluid Pack lot # [id number]  . CORONARY ARTERY BYPASS GRAFT    . dvt    . EYE SURGERY    . LEG SURGERY Right     Prior to Admission medications   Medication Sig Start Date End Date Taking? Authorizing Provider  ALPRAZolam (Duanne Moron 0.5 MG tablet Take 1 tablet (0.5 mg total) by mouth at bedtime as needed for anxiety. Takes 3 times weekly. 08/20/17   PFritzi Mandes MD  atorvastatin (LIPITOR) 20 MG tablet Take 1 tablet (20 mg total) by mouth daily. 02/03/17   CTawni Millers MD  carbidopa-levodopa (SINEMET IR) 25-250 MG  tablet TAKE ONE TABLET BY MOUTH 3 TIMES A DAY 12/22/17   Tawni Millers, MD  diltiazem (CARDIZEM CD) 120 MG 24 hr capsule TAKE ONE CAPSULE BY MOUTH EVERY DAY 10/28/17   Tawni Millers, MD  donepezil (ARICEPT) 10 MG tablet Take 5 mg by mouth at bedtime.    [provider]  furosemide (LASIX) 40 MG tablet Take 1 tablet (40 mg total) by mouth daily. 08/11/17 08/11/18  Tawni Millers, MD  ibuprofen (ADVIL,MOTRIN) 600 MG tablet Take 1 tablet (600 mg total) by mouth 3 (three) times daily. for 3-4 days with food 08/20/17   Fritzi Mandes, MD  metoprolol tartrate (LOPRESSOR) 50 MG tablet  TAKE ONE TABLET BY MOUTH 2 TIMES A DAY 12/22/17   Doles-Johnson, Teah, NP  risperiDONE (RISPERDAL) 1 MG tablet Take 1 tablet (1 mg total) by mouth 2 (two) times daily. 08/20/17   Fritzi Mandes, MD  traZODone (DESYREL) 50 MG tablet Take 1 tablet (50 mg total) by mouth at bedtime. 08/20/17   Fritzi Mandes, MD  warfarin (COUMADIN) 5 MG tablet Take 2.5 mg on sat and Sunday and then resume the routine  With Taking 1/2 Tablet on Monday and Friday. Take 1 Tablet on Tuesday, Wednesday, Thursday, Saturday and Sunday or as directed. 08/20/17   Fritzi Mandes, MD    Allergies Patient has no known allergies.  Family History  Problem Relation Age of Onset  . CAD Mother     Social History Social History   Tobacco Use  . Smoking status: Former Research scientist (life sciences)  . Smokeless tobacco: Never Used  Substance Use Topics  . Alcohol use: No  . Drug use: No    Review of Systems Level 5 exemption history limited by the patient's clinical condition ____________________________________________   PHYSICAL EXAM:  VITAL SIGNS: ED Triage Vitals  Enc Vitals Group     BP 02/18/2018 0522 115/80     Pulse Rate 02/18/2018 0521 92     Resp 03/13/2018 0521 (!) 34     Temp 02/28/2018 0521 (!) 100.6 F (38.1 C)     Temp Source 03/11/2018 0521 Rectal     SpO2 02/14/2018 0521 100 %     Weight --      Height --      Head Circumference --      Peak Flow --      Pain Score --      Pain Loc --      Pain Edu? --      Excl. in [place]? --     Constitutional: In severe respiratory distress.  On BiPAP but appears very uncomfortable Eyes: PERRL EOMI. Head: Atraumatic. Nose: No congestion/rhinnorhea. Mouth/Throat: No trismus Neck: No stridor.   Cardiovascular: Tachycardic and irregularly irregular Respiratory: Severe respiratory distress using accessory muscles with rhonchi throughout Gastrointestinal: Soft nontender Musculoskeletal: No lower extremity edema   Neurologic: Moves all 4 Skin: Diaphoretic soaking through the sheets Psychiatric:  Encephalopathic    ____________________________________________   DIFFERENTIAL includes but not limited to  Influenza, pneumonia, sepsis, septic shock ____________________________________________   LABS (all labs ordered are listed, but only abnormal results are displayed)  Labs Reviewed  COMPREHENSIVE METABOLIC PANEL - Abnormal; Notable for the following components:      Result Value   Potassium 3.3 (*)    Glucose, Bld 172 (*)    Calcium 8.7 (*)    AST 102 (*)    All other components within normal limits  TROPONIN I - Abnormal; Notable for the following  components:   Troponin I 26.62 (*)    All other components within normal limits  CBC WITH DIFFERENTIAL/PLATELET - Abnormal; Notable for the following components:   WBC 12.5 (*)    Neutro Abs 10.9 (*)    All other components within normal limits  URINALYSIS, ROUTINE W REFLEX MICROSCOPIC - Abnormal; Notable for the following components:   Color, Urine YELLOW (*)    APPearance HAZY (*)    Glucose, UA 50 (*)    Hgb urine dipstick MODERATE (*)    Ketones, ur 5 (*)    Protein, ur 100 (*)    All other components within normal limits  BLOOD GAS, ARTERIAL - Abnormal; Notable for the following components:   pH, Arterial 7.47 (*)    pO2, Arterial 245 (*)    Acid-Base Excess 2.1 (*)    All other components within normal limits  CG4 I-STAT (LACTIC ACID) - Abnormal; Notable for the following components:   Lactic Acid, Venous 2.85 (*)    All other components within normal limits  CULTURE, BLOOD (ROUTINE X 2)  CULTURE, BLOOD (ROUTINE X 2)  URINE CULTURE  INFLUENZA PANEL BY PCR (TYPE A & B)  LIPASE, BLOOD  PROCALCITONIN  PROTIME-INR  LACTIC ACID, PLASMA  LACTIC ACID, PLASMA  APTT  I-STAT CG4 LACTIC ACID, ED    Lab work reviewed by me with a number of abnormalities most notably elevated lactic acid and white count concerning for infection. Troponin of 26 likely secondary to  demand __________________________________________  EKG  ED ECG REPORT I, Darel Hong, the attending physician, personally viewed and interpreted this ECG.  Date: 03/04/2018 EKG Time:  Rate: 108 Rhythm: Atrial fibrillation QRS Axis: normal Intervals: normal ST/T Wave abnormalities: normal Narrative Interpretation: Poor R wave progression with nonspecific lateral ST depression but no elevation  ____________________________________________  RADIOLOGY  Chest x-ray reviewed by me consistent with infiltrate as well as pulmonary edema ____________________________________________   PROCEDURES  Procedure(s) performed: Yes  .Critical Care Performed by: Darel Hong, MD Authorized by: Darel Hong, MD   Critical care provider statement:    Critical care time (minutes):  50   Critical care time was exclusive of:  Separately billable procedures and treating other patients   Critical care was necessary to treat or prevent imminent or life-threatening deterioration of the following conditions:  Sepsis and respiratory failure   Critical care was time spent personally by me on the following activities:  Development of treatment plan with patient or surrogate, discussions with consultants, evaluation of patient's response to treatment, examination of patient, obtaining history from patient or surrogate, ordering and performing treatments and interventions, ordering and review of laboratory studies, ordering and review of radiographic studies, pulse oximetry, re-evaluation of patient's condition and review of old charts Procedure Name: Intubation Date/Time: 02/24/2018 7:05 AM Performed by: Darel Hong, MD Pre-anesthesia Checklist: Patient identified, Patient being monitored, Emergency Drugs available, Timeout performed and Suction available Oxygen Delivery Method: Non-rebreather mask Preoxygenation: Pre-oxygenation with 100% oxygen Induction Type: Rapid sequence Ventilation:  Mask ventilation without difficulty Laryngoscope Size: Mac and 4 Grade View: Grade I Number of attempts: 1 Placement Confirmation: ETT inserted through vocal cords under direct vision,  CO2 detector and Breath sounds checked- equal and bilateral Secured at: 23 cm Tube secured with: ETT holder    OG placement Date/Time: 02/16/2018 7:06 AM Performed by: Darel Hong, MD Authorized by: Darel Hong, MD  Consent: The procedure was performed in an emergent situation. Patient tolerance: Patient tolerated the procedure well with  no immediate complications     Critical Care performed: Yes  ____________________________________________   INITIAL IMPRESSION / ASSESSMENT AND PLAN / ED COURSE  Pertinent labs & imaging results that were available during my care of the patient were reviewed by me and considered in my medical decision making (see chart for details).   As part of my medical decision making, I reviewed the following data within the [place] History obtained from family if available, nursing notes, old chart and ekg, as well as notes from prior ED visits.  The patient comes to the emergency department critically ill saturating 60% on room air although saturating in the 90s on BiPAP.  He is [place] speaking and family came to bedside explaining that he is not making any sense and he is clearly very confused.  Even his altered mental status and BiPAP requirement decision was made to intubate.  Core temperature was 100.6 degrees and I had high clinical suspicion for pneumonia and severe sepsis.  Intubated without complication and I placed him on broad-spectrum antibiotics.  His first lactic acid came back at 2.8 so we will continue IV fluid resuscitation.  Following intubation I brought the patient's family into the room and kept him abreast of his critical status.  And then discussed with the hospitalist Dr. Marcille Blanco who has graciously agreed to admit the patient to  his service.     ----------------------------------------- 7:20 AM on 02/17/2018 -----------------------------------------  The patient's troponin came back at 26.  I repeated his EKG and it remains atrial fibrillation at 108 with poor R wave progression although no ST elevation to suggest STEMI.  I do have a call out to his cardiologist now to discuss however it is possible this is secondary to his severe sepsis. ____________________________________________ ----------------------------------------- 7:27 AM on 03/03/2018 -----------------------------------------  Spoke with Dr. Katharina Caper who feels this is either likely secondary to demand from the severe sepsis versus he may have clotted off a small vessel however given his clinical status no indication for the catheterization lab.  He said there is controversy regarding heparinization in a case like this and he would recommend against it although it is reasonable.  FINAL CLINICAL IMPRESSION(S) / ED DIAGNOSES  Final diagnoses:  Severe sepsis ([place])      NEW MEDICATIONS STARTED DURING THIS VISIT:  New Prescriptions   No medications on file     Note:  This document was prepared using Dragon voice recognition software and may include unintentional dictation errors.     Darel Hong, MD 03/06/2018 [phone]

## 2018-02-13 NOTE — Consult Note (Addendum)
Name: Adam Roberson MRN: [id number] DOB: [birth year]     CONSULTATION DATE: 03/09/2018   HISTORY OF PRESENT ILLNESS:   84 years old gentleman with history of coronary artery disease status post CABG for 3 vessels 2003, hypertension, HF PEF EF 60 to 65% on echocardiogram 2017 with diastolic dysfunction, atrial fibrillation hypertension, hyperlipidemia, Parkinson disease, status post CVA, and DVT on warfarin.  Patient presented to ED with shortness of breath, desaturation O2 sats 60% on room air.  He was initially tried on a BiPAP and had to be intubated because of no improvement.  History of chest pain 2 days ago associated with worsening respiratory status.  Low-grade fever 100.6 upon presentation to ED All history was obtained from nursing, family at the bedside and EMR. Patient arrived to the intensive care unit on ventilator sedated with propofol and fentanyl, on heparin drip and in no distress  PAST MEDICAL HISTORY :   has a past medical history of CAD (coronary artery disease), Chronic diastolic CHF (congestive heart failure) (HCC), DVT (deep venous thrombosis) (HCC), DVT of leg (deep venous thrombosis) (HCC), Dysrhythmia, H/O: CVA (cerebrovascular accident), HOH (hard of hearing), Hyperlipidemia, Hypertension, Hypertensive heart disease, Parkinson disease (HCC), Permanent atrial fibrillation (HCC), Pneumonia, and Stroke (HCC).  has a past surgical history that includes Cardiac surgery (age 84); Coronary artery bypass graft; dvt; Cataract extraction w/PHACO (Left, 08/27/2015); Eye surgery; Cataract extraction w/PHACO (Right, 11/05/2015); and Leg Surgery (Right). Prior to Admission medications   Medication Sig Start Date End Date Taking? Authorizing Provider  ALPRAZolam Prudy Feeler(XANAX) 0.5 MG tablet Take 1 tablet (0.5 mg total) by mouth at bedtime as needed for anxiety. Takes 3 times weekly. 08/20/17   Enedina FinnerPatel, Sona, MD  atorvastatin (LIPITOR) 20 MG tablet Take 1 tablet (20 mg total) by mouth daily.  02/03/17   Virl Axehaplin, Don C, MD  carbidopa-levodopa (SINEMET IR) 25-250 MG tablet TAKE ONE TABLET BY MOUTH 3 TIMES A DAY 12/22/17   Virl Axehaplin, Don C, MD  diltiazem (CARDIZEM CD) 120 MG 24 hr capsule TAKE ONE CAPSULE BY MOUTH EVERY DAY 10/28/17   Virl Axehaplin, Don C, MD  donepezil (ARICEPT) 10 MG tablet Take 5 mg by mouth at bedtime.    [provider]  furosemide (LASIX) 40 MG tablet Take 1 tablet (40 mg total) by mouth daily. 08/11/17 08/11/18  Virl Axehaplin, Don C, MD  ibuprofen (ADVIL,MOTRIN) 600 MG tablet Take 1 tablet (600 mg total) by mouth 3 (three) times daily. for 3-4 days with food 08/20/17   Enedina FinnerPatel, Sona, MD  metoprolol tartrate (LOPRESSOR) 50 MG tablet TAKE ONE TABLET BY MOUTH 2 TIMES A DAY 12/22/17   Doles-Johnson, Teah, NP  risperiDONE (RISPERDAL) 1 MG tablet Take 1 tablet (1 mg total) by mouth 2 (two) times daily. 08/20/17   Enedina FinnerPatel, Sona, MD  traZODone (DESYREL) 50 MG tablet Take 1 tablet (50 mg total) by mouth at bedtime. 08/20/17   Enedina FinnerPatel, Sona, MD  warfarin (COUMADIN) 5 MG tablet Take 2.5 mg on sat and Sunday and then resume the routine  With Taking 1/2 Tablet on Monday and Friday. Take 1 Tablet on Tuesday, Wednesday, Thursday, Saturday and Sunday or as directed. 08/20/17   Enedina FinnerPatel, Sona, MD   No Known Allergies  FAMILY HISTORY:  family history includes CAD in his mother. SOCIAL HISTORY:  reports that he has quit smoking. He has never used smokeless tobacco. He reports that he does not drink alcohol or use drugs.  REVIEW OF SYSTEMS:   Unable to obtain due to critical  illness   VITAL SIGNS: Temp:  [98.2 F (36.8 C)-100.6 F (38.1 C)] 98.2 F (36.8 C) (12/01 0834) Pulse Rate:  [76-115] 108 (12/01 0834) Resp:  [16-35] 17 (12/01 0834) BP: (104-153)/(74-98) 153/98 (12/01 0834) SpO2:  [86 %-99 %] 95 % (12/01 0834) FiO2 (%):  [30 %-60 %] 30 % (12/01 0856) Weight:  [62 kg-66.2 kg] 62 kg (12/01 0829)  Physical Examination:  Sedated with propofol and fentanyl to RASS of -3 On vent, no distress,  bilateral equal air entry and no adventitious sounds S1 & S2 are audible with no murmur Benign abdominal exam with feeble peristalsis No leg edema  ASSESSMENT / PLAN:  *Acute hypoxic respiratory failure *Atelectasis and pneumonia.  Bilateral airspace disease with pulmonary congestion, right pleural effusion and questionable chronic lung changes. -CT chest -C/W cefepime + vancomycin + D/C Flagyl.  Monitor CXR + CBC + FiO2 -Monitor ABG, optimize vent setting and continuous vent support  *NSTEMI with cardiogenic shock currently off dopamine *Coronary artery disease status post CABG in 2000 *HFpEF 60 to 65% on echo 2017 *A. fib on anticoagulation with warfarin -Heparin drip + aspirin + beta-blocker as tolerated -Monitor cardiac enzyme and echocardiogram -Rate control -Management as per cardiology  *Lactic acidemia was tissue hypoperfusion.  Sepsis is unlikely was procalcitonin less than 0.1 -Optimize hydration, hemodynamics and monitor lactic acid  *Elevated AST on statins.  Possible congestive hepatopathy with acute coronary syndrome and cardiogenic shock -Optimize hydration, avoid hepatotoxins and monitor LFTs -Liver ultrasound if no improvement  *Coagulopathy due to warfarin for for DVT and A. fib -Optimize anticoagulation with heparin drip and warfarin on hold  *Hypokalemia -Replete and monitor electrolytes  *Dyslipidemia -Hold statin because of elevated AST and monitor LFTs  *Dementia Parkinson disease -Sinemet -Supportive care  Full code  DVT & GI prophylaxis.  Continue with supportive care  19:00 Heparin was held earlier because of hemorrhagic aspirate via ETT which had stopped. As per cardiology recommendation, will resume Heparin for 48 h, close monitoring on H & H and consider d/c Heparin if bleeding.  Family (daughter) was updated at the bedside and agreed to the plan of care   Critical care time 50 minutes

## 2018-02-13 NOTE — Progress Notes (Signed)
ANTICOAGULATION CONSULT NOTE - Initial Consult  Pharmacy Consult for heparin drip Indication: chest pain/ACS  No Known Allergies  Patient Measurements: Height: 5\' 6"  (167.6 cm) Weight: 136 lb 11 oz (62 kg) IBW/kg (Calculated) : 63.8 Heparin Dosing Weight: 66 kg  Vital Signs: Temp: 99.4 F (37.4 C) (12/01 1700) Temp Source: Axillary (12/01 0829) BP: 88/74 (12/01 1700) Pulse Rate: 122 (12/01 1700)  Labs: Recent Labs    2018/02/11 0527 2018/02/11 0935 2018/02/11 1255 2018/02/11 1453 2018/02/11 1628  HGB 14.9  --   --   --   --   HCT 45.4  --   --   --   --   PLT 253  --   --   --   --   APTT  --  >160*  --  74*  --   LABPROT  --  29.2*  --   --   --   INR  --  2.81  --   --   --   HEPARINUNFRC  --   --   --  <0.10*  --   CREATININE 0.96  --   --   --   --   TROPONINI 26.62*  --  19.25*  --  24.64*    Estimated Creatinine Clearance: 47.5 mL/min (by C-G formula based on SCr of 0.96 mg/dL).   Medical History: Past Medical History:  Diagnosis Date  . CAD (coronary artery disease)    a.2003 s/[place] CABG x[place] in [place]; b. ? 2014 PCI (records not available).  . Chronic diastolic CHF (congestive heart failure) (HCC)    a. 06/2015 Echo: EF 60-65%, mod dil LA, nl RV, mild to mod TR, PASP 46mmHg.  Marland Kitchen. DVT (deep venous thrombosis) (HCC)   . DVT of leg (deep venous thrombosis) (HCC)   . Dysrhythmia   . H/O: CVA (cerebrovascular accident)   . HOH (hard of hearing)   . Hyperlipidemia   . Hypertension   . Hypertensive heart disease   . Parkinson disease (HCC)   . Permanent atrial fibrillation    a. CHA2DS2VASc= 7-->coumadin (followed by primary care).  . Pneumonia    4/17  . Stroke [facility](HCC)     Medications:  Warfarin as outpatient  Assessment: Trop  26.62   Goal of Therapy:  Heparin level 0.3-0.7 units/ml Monitor platelets by anticoagulation protocol: Yes   Plan:  4000 unit bolus and initial rate of 800 units/hr. First heparin level 8 hours after start of infusion  12/1 @ 15:00 :     APTT = 74 , HL < 0.1  Per RN, heparin gtt has been d/c'd due to pt having hemoptysis.  Will f/u plans to restart heparin with RN.   Daelen Belvedere D 03/04/2018,6:01 PM

## 2018-02-13 NOTE — Progress Notes (Signed)
ANTICOAGULATION CONSULT NOTE - Initial Consult  Pharmacy Consult for heparin drip Indication: chest pain/ACS  No Known Allergies  Patient Measurements: Height: 5\' 8"  (172.7 cm) Weight: 145 lb 15.1 oz (66.2 kg) IBW/kg (Calculated) : 68.4 Heparin Dosing Weight: 66 kg  Vital Signs: Temp: 100.6 F (38.1 C) (12/01 0521) Temp Source: Rectal (12/01 0521) BP: 104/80 (12/01 0700) Pulse Rate: 76 (12/01 0700)  Labs: Recent Labs    04/03/2017 0527  HGB 14.9  HCT 45.4  PLT 253  CREATININE 0.96  TROPONINI 26.62*    Estimated Creatinine Clearance: 50.8 mL/min (by C-G formula based on SCr of 0.96 mg/dL).   Medical History: Past Medical History:  Diagnosis Date  . CAD (coronary artery disease)    a.2003 s/p CABG x3 in [place]; b. ? 2014 PCI (records not available).  . Chronic diastolic CHF (congestive heart failure) (HCC)    a. 06/2015 Echo: EF 60-65%, mod dil LA, nl RV, mild to mod TR, PASP 46mmHg.  Marland Kitchen. DVT (deep venous thrombosis) (HCC)   . DVT of leg (deep venous thrombosis) (HCC)   . Dysrhythmia   . H/O: CVA (cerebrovascular accident)   . HOH (hard of hearing)   . Hyperlipidemia   . Hypertension   . Hypertensive heart disease   . Parkinson disease (HCC)   . Permanent atrial fibrillation (HCC)    a. CHA2DS2VASc= 7-->coumadin (followed by primary care).  . Pneumonia    4/17  . Stroke [facility](HCC)     Medications:  Warfarin as outpatient  Assessment: Trop  26.62   Goal of Therapy:  Heparin level 0.3-0.7 units/ml Monitor platelets by anticoagulation protocol: Yes   Plan:  4000 unit bolus and initial rate of 800 units/hr. First heparin level 8 hours after start of infusion  Melany Wiesman S 02/24/2018,7:06 AM

## 2018-02-13 NOTE — ED Triage Notes (Signed)
Pt arrived from home via EMS with complaints of respiratory distress and chest congestion. Pt has Hx of dementia, arrythmia and HTN. EMS reported A-Fib on monitor. EMS reported pt O2sat was 60%RA. EMS placed a 20 gauge in left forearm. VS per EMS BP-132/78. Pt daughter at bedside. Pt preferred language is [place].

## 2018-02-13 NOTE — Progress Notes (Signed)
*  PRELIMINARY RESULTS* Echocardiogram 2D Echocardiogram has been performed.  Joanette GulaJoan M Jshon Ibe 02/16/2018, 1:05 PM

## 2018-02-13 NOTE — Progress Notes (Signed)
eLink Physician-Brief Progress Note Patient Name: Adam Roberson DOB: 05-01-1928 MRN: [id number]   Date of Service  03/12/2018  HPI/Events of Note  Agitation on the ventilator, Afib with rapid ventricular response rate.  eICU Interventions  Increase Fentanyl infusion rate ceiling to 200, Metoprolol 2.5 mg iv Q 6 hrs prn HR > 110        Norrine Ballester U Karem Farha 02/16/2018, 9:31 PM

## 2018-02-13 NOTE — Consult Note (Signed)
Cardiology Consultation:   Patient ID: Adam Roberson MRN: [id number]; DOB: 1928/08/10  Admit date: 03/08/2018 Date of Consult: 02/24/2018  Primary Care Provider: Raynelle Bring Primary Cardiologist: Kirke Corin  Primary Electrophysiologist:  None   Patient Profile:   Adam Roberson is a 84 y.o. male with a hx of  CAD  who is being seen today for the evaluation of  Elevated Troponin in the setting of pneumonia and sepsis  at the request of  Dr. Cherlynn Kaiser. Marland Kitchen  History of Present Illness:   Adam Roberson is an 84 year old gentleman with a history of coronary artery disease, chronic diastolic congestive heart failure, atrial fibrillation.  He was admitted to the hospital with fever, severe shortness of breath and cough, encephalopathy.  He was noted to have an elevated troponin level of 26.62.  His lactic acid level is 2.6.  White blood cell count is 12.5 with a predominance of neutrophils.  The patient was encephalopathic when he presented to the emergency room.  He was not able to provide much history.  He was acutely ill and was intubated in the emergency room.  Apparently the intubation was mildly traumatic.  He has some slight bleeding from his airway  Family members reported that he had had some chest pain several days ago.  The chest pain radiated to his left arm.  Checks x-ray reveals moderate cardiomegaly and bilateral pulmonary infiltrates. He has been on Coumadin for his atrial fibrillation.  He was also placed on heparin drip with following the discovery of the elevated troponin levels.  Past Medical History:  Diagnosis Date  . CAD (coronary artery disease)    a.2003 s/p CABG x3 in [place]; b. ? 2014 PCI (records not available).  . Chronic diastolic CHF (congestive heart failure) (HCC)    a. 06/2015 Echo: EF 60-65%, mod dil LA, nl RV, mild to mod TR, PASP .  Marland Kitchen DVT (deep venous thrombosis) (HCC)   . DVT of leg (deep venous thrombosis) (HCC)   . Dysrhythmia   . H/O:  CVA (cerebrovascular accident)   . HOH (hard of hearing)   . Hyperlipidemia   . Hypertension   . Hypertensive heart disease   . Parkinson disease (HCC)   . Permanent atrial fibrillation (HCC)    a. CHA2DS2VASc= 7-->coumadin (followed by primary care).  . Pneumonia    4/17  . Stroke [facility])     Past Surgical History:  Procedure Laterality Date  . CARDIAC SURGERY  age 88   bypass  . CATARACT EXTRACTION W/PHACO Left 08/27/2015   Procedure: CATARACT EXTRACTION PHACO AND INTRAOCULAR LENS PLACEMENT (IOC);  Surgeon: Lockie Mola, MD;  Location: [facility] ORS;  Service: Ophthalmology;  Laterality: Left;  [place] 00:56 AP% 11.6 CDE 6.45 fluid pack lot # [id number]  . CATARACT EXTRACTION W/PHACO Right 11/05/2015   Procedure: CATARACT EXTRACTION PHACO AND INTRAOCULAR LENS PLACEMENT (IOC);  Surgeon: Lockie Mola, MD;  Location: [facility] ORS;  Service: Ophthalmology;  Laterality: Right;  [place] 01:06 AP% 14.3 CDE 9.46 Fluid Pack lot # [id number]  . CORONARY ARTERY BYPASS GRAFT    . dvt    . EYE SURGERY    . LEG SURGERY Right      Home Medications:  Prior to Admission medications   Medication Sig Start Date End Date Taking? Authorizing Provider  ALPRAZolam Prudy Feeler) 0.5 MG tablet Take 1 tablet (0.5 mg total) by mouth at bedtime as needed for anxiety. Takes 3 times weekly. 08/20/17   Enedina Finner, MD  atorvastatin (LIPITOR) 20 MG  tablet Take 1 tablet (20 mg total) by mouth daily. 02/03/17   Virl Axehaplin, Don C, MD  carbidopa-levodopa (SINEMET IR) 25-250 MG tablet TAKE ONE TABLET BY MOUTH 3 TIMES A DAY 12/22/17   Virl Axehaplin, Don C, MD  diltiazem (CARDIZEM CD) 120 MG 24 hr capsule TAKE ONE CAPSULE BY MOUTH EVERY DAY 10/28/17   Virl Axehaplin, Don C, MD  donepezil (ARICEPT) 10 MG tablet Take 5 mg by mouth at bedtime.    [provider]  furosemide (LASIX) 40 MG tablet Take 1 tablet (40 mg total) by mouth daily. 08/11/17 08/11/18  Virl Axehaplin, Don C, MD  ibuprofen (ADVIL,MOTRIN) 600 MG tablet Take 1 tablet (600 mg total) by  mouth 3 (three) times daily. for 3-4 days with food 08/20/17   Enedina FinnerPatel, Sona, MD  metoprolol tartrate (LOPRESSOR) 50 MG tablet TAKE ONE TABLET BY MOUTH 2 TIMES A DAY 12/22/17   Doles-Johnson, Teah, NP  risperiDONE (RISPERDAL) 1 MG tablet Take 1 tablet (1 mg total) by mouth 2 (two) times daily. 08/20/17   Enedina FinnerPatel, Sona, MD  traZODone (DESYREL) 50 MG tablet Take 1 tablet (50 mg total) by mouth at bedtime. 08/20/17   Enedina FinnerPatel, Sona, MD  warfarin (COUMADIN) 5 MG tablet Take 2.5 mg on sat and Sunday and then resume the routine  With Taking 1/2 Tablet on Monday and Friday. Take 1 Tablet on Tuesday, Wednesday, Thursday, Saturday and Sunday or as directed. 08/20/17   Enedina FinnerPatel, Sona, MD    Inpatient Medications: Scheduled Meds: . docusate sodium  100 mg Oral BID   Continuous Infusions: . sodium chloride 125 mL/hr at 11/30/17 0906  . ceFEPime (MAXIPIME) IV    . DOPamine Stopped (11/30/17 0705)  . fentaNYL infusion INTRAVENOUS 100 mcg/hr (11/30/17 0550)  . heparin 800 Units/hr (11/30/17 0735)  . metronidazole Stopped (11/30/17 0737)  . propofol (DIPRIVAN) infusion 10 mcg/kg/min (11/30/17 0649)  . vancomycin     PRN Meds: acetaminophen **OR** acetaminophen, ondansetron **OR** ondansetron (ZOFRAN) IV  Allergies:   No Known Allergies  Social History:   Social History   Socioeconomic History  . Marital status: Married    Spouse name: Not on file  . Number of children: Not on file  . Years of education: Not on file  . Highest education level: Not on file  Occupational History  . Occupation: not working  Engineer, productionocial Needs  . Financial resource strain: Not very hard  . Food insecurity:    Worry: Never true    Inability: Never true  . Transportation needs:    Medical: No    Non-medical: No  Tobacco Use  . Smoking status: Former Games developermoker  . Smokeless tobacco: Never Used  Substance and Sexual Activity  . Alcohol use: No  . Drug use: No  . Sexual activity: Not on file  Lifestyle  . Physical activity:    Days  per week: 0 days    Minutes per session: 0 min  . Stress: Not at all  Relationships  . Social connections:    Talks on phone: More than three times a week    Gets together: More than three times a week    Attends religious service: Patient refused    Active member of club or organization: Patient refused    Attends meetings of clubs or organizations: Patient refused    Relationship status: Married  . Intimate partner violence:    Fear of current or ex partner: No    Emotionally abused: No    Physically abused: No  Forced sexual activity: No  Other Topics Concern  . Not on file  Social History Narrative   Lives at home with family. Not on food stamps.    Family History:    Family History  Problem Relation Age of Onset  . CAD Mother      ROS:  Please see the history of present illness.   All other ROS reviewed and negative.     Physical Exam/Data:   Vitals:   2018-02-19 0800 19-Feb-2018 0810 19-Feb-2018 0829 Feb 19, 2018 0834  BP: 126/87 129/86 (!) 153/98 (!) 153/98  Pulse: (!) 112 (!) 101 (!) 110 (!) 108  Resp:   16 17  Temp:   98.2 F (36.8 C) 98.2 F (36.8 C)  TempSrc:   Axillary   SpO2: 97% 97% 95% 95%  Weight:   62 kg   Height:   5\' 6"  (1.676 m)     Intake/Output Summary (Last 24 hours) at 02-19-2018 1040 Last data filed at 19-Feb-2018 0754 Gross per 24 hour  Intake 200 ml  Output -  Net 200 ml   Filed Weights   2018-02-19 0522 02-19-2018 0829  Weight: 66.2 kg 62 kg   Body mass index is 22.06 kg/m.  General:   Elderly gentleman, currently on the vent and sedated. HEENT: normal Lymph: no adenopathy Neck: no JVD Endocrine:  No thryomegaly Vascular: No carotid bruits; FA pulses 2+ bilaterally without bruits  Cardiac: Irregularly irregular.  No gallop.  Soft systolic murmur. Lungs: On the vent Abd: soft, nontender, no hepatomegaly  Ext: no edema Musculoskeletal:  No deformities, BUE and BLE strength normal and equal Skin: warm and dry  Neuro: Sedated on the  ventilator.  Unable to assess his neurologic status. Psych: We will to assess  EKG:  The EKG was personally reviewed and demonstrates: Atrial fibrillation with a rapid ventricular response of 113 bpm.  Nonspecific ST and T wave changes.  Telemetry:  Telemetry was personally reviewed and demonstrates: Atrial fibrillation  Relevant CV Studies:   Laboratory Data:  Chemistry Recent Labs  Lab Feb 19, 2018 0527  NA 138  K 3.3*  CL 100  CO2 27  GLUCOSE 172*  BUN 20  CREATININE 0.96  CALCIUM 8.7*  GFRNONAA >60  GFRAA >60  ANIONGAP 11    Recent Labs  Lab 02-19-18 0527  PROT 7.7  ALBUMIN 3.6  AST 102*  ALT 17  ALKPHOS 86  BILITOT 0.6   Hematology Recent Labs  Lab Feb 19, 2018 0527  WBC 12.5*  RBC 5.06  HGB 14.9  HCT 45.4  MCV 89.7  MCH 29.4  MCHC 32.8  RDW 13.1  PLT 253   Cardiac Enzymes Recent Labs  Lab February 19, 2018 0527  TROPONINI 26.62*   No results for input(s): TROPIPOC in the last 168 hours.  BNPNo results for input(s): BNP, PROBNP in the last 168 hours.  DDimer No results for input(s): DDIMER in the last 168 hours.  Radiology/Studies:  Dg Chest Port 1 View  Result Date: 02-19-18 CLINICAL DATA:  Initial evaluation for sepsis, post intubation. EXAM: PORTABLE CHEST 1 VIEW COMPARISON:  Prior radiograph from 08/17/2017 FINDINGS: Endotracheal tube in place with tip position approximately 15 mm above the carina. Enteric tube courses into the abdomen with side hole beyond the GE junction. Median sternotomy wires underlying surgical clips noted, stable. Cardiomegaly unchanged. Mediastinal silhouette normal. Aortic atherosclerosis. Lungs are mildly hypoinflated. Diffuse pulmonary vascular and interstitial congestion, suggesting pulmonary interstitial edema. There are superimposed confluent multifocal opacities at the right mid and lower  lung as well as the left lung base, suspicious for infiltrates. Associated small right pleural effusion. No pneumothorax. No acute osseus  abnormality. IMPRESSION: 1. Tip of the endotracheal tube approximately 15 mm above the carina. 2. Cardiomegaly with diffuse pulmonary vascular and interstitial congestion, suggesting pulmonary interstitial edema. 3. Superimposed multifocal opacities at the right mid and lower lung and left lung base, suspicious for infiltrates. 4. Small right pleural effusion. Electronically Signed   By: Rise Mu M.D.   On: 02/18/2018 05:57    Assessment and Plan:   Non-ST segment elevation myocardial infarction: Patient presents with acute respiratory distress and encephalopathy.  The family states that he had been complaining of some chest discomfort for the past several days.  The chest pain radiated to the left arm.  The patient presents with fever, elevated white count and severe respiratory distress.  His chest x-ray appears consistent with ARDS.  I am concerned about pneumonia and sepsis syndrome although I cannot completely exclude acute pulmonary edema secondary to his myocardial infarction.  According to family his chest pain started 2 days ago.  If this is the case then there is no indication to proceed with urgent PCI at this point since his been 48 hours.  Continue supportive care including dopamine and Lasix.  He is on the ventilator.  I have ordered repeat echocardiogram to assess his LV function. He has some blood-tinged sputum coming from his ET tube.  If this blood-tinged gradually clears then it is probably acceptable to continue heparin for 48 hours.  If the bleeding seems to worsen I would DC the heparin.  2.  Atrial fibrillation: His atrial fibrillation rate is well controlled at this point.   He is currently on heparin drip.  I will hold his Coumadin for now in case he needs procedures in the near future.    For questions or updates, please contact [facility] Please consult [email or web address] for contact info under     Signed, Kristeen Miss, MD  03/01/2018 10:40 AM

## 2018-02-13 NOTE — Progress Notes (Signed)
[employer] - [place] [facility]      PATIENT NAME: Chadric Kimberley    MR#:  [id number]  DATE OF BIRTH:  Jan 06, 1929  SUBJECTIVE:   Patient admitted to the hospital secondary to shortness of breath and respiratory distress and noted to be in congestive heart failure.  Patient is intubated and sedated.  Also noted to have a non-ST elevation MI.  REVIEW OF SYSTEMS:    Review of Systems  Unable to perform ROS: Intubated    Nutrition: NPO Tolerating Diet: No Tolerating PT: Await Eval.    DRUG ALLERGIES:  No Known Allergies  VITALS:  Blood pressure 97/72, pulse (!) 119, temperature 98.2 F (36.8 C), resp. rate 16, height 5\' 6"  (1.676 m), weight 62 kg, SpO2 99 %.  PHYSICAL EXAMINATION:   Physical Exam  GENERAL:  84 y.o.-year-old patient lying in bed intubated & sedated.   EYES: Pupils equal, round, reactive to light. No scleral icterus.   HEENT: Head atraumatic, normocephalic. ET and oG tubes in place.  NECK:  Supple, no jugular venous distention. No thyroid enlargement, no tenderness.  LUNGS: Normal breath sounds bilaterally, no wheezing, rales, diffuse upper airway rhonchi. No use of accessory muscles of respiration.  CARDIOVASCULAR: S1, S2 normal. No murmurs, rubs, or gallops.  ABDOMEN: Soft, nontender, nondistended. Bowel sounds present. No organomegaly or mass.  EXTREMITIES: No cyanosis, clubbing or edema b/l.    NEUROLOGIC: SEdated & Intubated.  PSYCHIATRIC: Sedated & Intubated.  SKIN: No obvious rash, lesion, or ulcer.    LABORATORY PANEL:   CBC Recent Labs  Lab 02/19/2018 0527  WBC 12.5*  HGB 14.9  HCT 45.4  PLT 253   ------------------------------------------------------------------------------------------------------------------  Chemistries  Recent Labs  Lab 02/15/2018 0527  NA 138  K 3.3*  CL 100  CO2 27  GLUCOSE 172*  BUN 20  CREATININE 0.96  CALCIUM 8.7*  AST 102*  ALT 17  ALKPHOS 86  BILITOT 0.6    ------------------------------------------------------------------------------------------------------------------  Cardiac Enzymes Recent Labs  Lab 03/15/2018 1255  TROPONINI 19.25*   ------------------------------------------------------------------------------------------------------------------  RADIOLOGY:  Ct Angio Chest Pe W Or Wo Contrast  Result Date: 03/08/2018 CLINICAL DATA:  Decreased oxygen saturation requiring intubation. EXAM: CT ANGIOGRAPHY CHEST WITH CONTRAST TECHNIQUE: Multidetector CT imaging of the chest was performed using the standard protocol during bolus administration of intravenous contrast. Multiplanar CT image reconstructions and MIPs were obtained to evaluate the vascular anatomy. CONTRAST:  75mL OMNIPAQUE IOHEXOL 350 MG/ML SOLN COMPARISON:  Chest radiograph 02/23/2018 FINDINGS: Cardiovascular: Satisfactory opacification of the pulmonary arteries to the segmental level. No evidence of pulmonary embolism. Enlarged heart. No pericardial effusion. Calcific atherosclerotic disease of the coronary arteries and aorta. Annular calcifications of the mitral valve. Mediastinum/Nodes: Borderline enlarged bilateral mediastinal lymph nodes. The patient is intubated. Main bronchi are patent. Enteric catheter within the esophagus, terminates in the distal gastric body. Lungs/Pleura: Moderate right and small left pleural effusions. Mild interstitial pulmonary edema. Scattered ground-glass opacities throughout both lungs, with more confluent appearance in the lower lobes, left greater than right. Upper Abdomen: Right renal cyst. 18 mm hypoattenuated nodule within the spleen, likely benign. Musculoskeletal: Findings of diffuse idiopathic skeletal hyperostosis of the thoracic spine Review of the MIP images confirms the above findings. IMPRESSION: Enlarged heart with interstitial pulmonary edema and bilateral pleural effusions. Superimposed patchy airspace consolidation in both lungs, more  confluent in the lower lobes, suspicious for multifocal pneumonia. Borderline mediastinal lymphadenopathy, likely reactive. Aortic Atherosclerosis (ICD10-I70.0). Electronically Signed   By: Ulanda Edison.D.  On: 03-Apr-2017 12:50   Dg Chest Port 1 View  Result Date: 02/24/2018 CLINICAL DATA:  Initial evaluation for sepsis, post intubation. EXAM: PORTABLE CHEST 1 VIEW COMPARISON:  Prior radiograph from 08/17/2017 FINDINGS: Endotracheal tube in place with tip position approximately 15 mm above the carina. Enteric tube courses into the abdomen with side hole beyond the GE junction. Median sternotomy wires underlying surgical clips noted, stable. Cardiomegaly unchanged. Mediastinal silhouette normal. Aortic atherosclerosis. Lungs are mildly hypoinflated. Diffuse pulmonary vascular and interstitial congestion, suggesting pulmonary interstitial edema. There are superimposed confluent multifocal opacities at the right mid and lower lung as well as the left lung base, suspicious for infiltrates. Associated small right pleural effusion. No pneumothorax. No acute osseus abnormality. IMPRESSION: 1. Tip of the endotracheal tube approximately 15 mm above the carina. 2. Cardiomegaly with diffuse pulmonary vascular and interstitial congestion, suggesting pulmonary interstitial edema. 3. Superimposed multifocal opacities at the right mid and lower lung and left lung base, suspicious for infiltrates. 4. Small right pleural effusion. Electronically Signed   By: Rise MuBenjamin  McClintock M.D.   On: 03-Apr-2017 05:57     ASSESSMENT AND PLAN:   84 year old male with past medical history of consents disease, atrial fibrillation, hypertension, hyperlipidemia, history of previous CVA, previous history of DVT, chronic diastolic CHF who presented to the hospital due to shortness of breath and chest pain.  1.  Acute respiratory failure with hypoxia- secondary to a combination of mild CHF, pneumonia -Patient is currently  intubated and sedated.  Continue vent support as per intensivist. -Continue broad-spectrum IV antibiotics with vancomycin, cefepime.  2.  Non-ST elevation MI-patient presented to the hospital complaining of vague chest pain for the past 2 days prior to admission.  Initial troponins was noted to be as high as over 20. - Seen by cardiology, continue heparin drip for now. - Patient's blood pressure is on the lower side therefore cannot tolerate beta-blockers or ACE inhibitors.  Plan for cardiac catheterization and further intervention once extubated and hemodynamically stable.  3.  History of previous DVT- continue heparin drip for now.  4.  History of Parkinson's disease-we will resume Sinemet once patient can take p.o.  5.  Essential hypertension- blood pressure is on the low side therefore hold metoprolol for now.  6.  History of chronic diastolic CHF-patient received 1 dose of IV Lasix in the ER and responded well to it.  Currently intubated and sedated.  Continue vent support as per pulmonary. -Hold Lasix due to relative hypotension, hold metoprolol.  7.  History of atrial fibrillation-chronic for the patient.  Rate controlled presently. - cont. Heparin gtt.      All the records are reviewed and case discussed with Care Management/Social Worker. Management plans discussed with the patient, family and they are in agreement.  CODE STATUS: Full code  DVT Prophylaxis: Heparin gtt  TOTAL Critical care TIME TAKING CARE OF THIS PATIENT: 30 minutes.   POSSIBLE D/C IN 3-4 DAYS, DEPENDING ON CLINICAL CONDITION.   Houston SirenSAINANI,Kortlynn Poust J M.D on 03/09/2018 at 2:20 PM  Between 7am to 6pm - Pager - [phone]  After 6pm go to [email or web address] - Therapist, nutritionalpassword EPAS [facility]  [facility] [place] [facility]  Office  [phone]  CC: Primary care physician; [facility]

## 2018-02-13 NOTE — Progress Notes (Signed)
CODE SEPSIS - PHARMACY COMMUNICATION  **Broad Spectrum Antibiotics should be administered within 1 hour of Sepsis diagnosis**  Time Code Sepsis Called/Page Received: 1201 0527  Antibiotics Ordered: 1201 0524  Time of 1st antibiotic administration: 1201 0552  Additional action taken by pharmacy:   If necessary, Name of Provider/Nurse Contacted:     Erich MontaneMcBane,Hanny Elsberry S ,PharmD Clinical Pharmacist  03/08/2018  5:56 AM

## 2018-02-13 NOTE — Progress Notes (Signed)
Notified Gretchen at [facility] of patients HR sustaining in the 130's. Patient is also biting ETT and fentanyl is infusing at max ordered rate. Acknowledged and will speak with the MD. Awaiting new orders.

## 2018-02-13 NOTE — Progress Notes (Signed)
ANTICOAGULATION CONSULT NOTE - Initial Consult  Pharmacy Consult for heparin drip Indication: chest pain/ACS  No Known Allergies  Patient Measurements: Height: 5\' 6"  (167.6 cm) Weight: 136 lb 11 oz (62 kg) IBW/kg (Calculated) : 63.8 Heparin Dosing Weight: 66 kg  Vital Signs: Temp: 99.4 F (37.4 C) (12/01 1700) Temp Source: Axillary (12/01 0829) BP: 112/97 (12/01 1800) Pulse Rate: 115 (12/01 1800)  Labs: Recent Labs    03/15/2018 0527 02/27/2018 0935 03/09/2018 1255 02/17/2018 1453 03/07/2018 1628  HGB 14.9  --   --   --   --   HCT 45.4  --   --   --   --   PLT 253  --   --   --   --   APTT  --  >160*  --  74*  --   LABPROT  --  29.2*  --   --   --   INR  --  2.81  --   --   --   HEPARINUNFRC  --   --   --  <0.10*  --   CREATININE 0.96  --   --   --   --   TROPONINI 26.62*  --  19.25*  --  24.64*    Estimated Creatinine Clearance: 47.5 mL/min (by C-G formula based on SCr of 0.96 mg/dL).   Medical History: Past Medical History:  Diagnosis Date  . CAD (coronary artery disease)    a.2003 s/p CABG x3 in [place]; b. ? 2014 PCI (records not available).  . Chronic diastolic CHF (congestive heart failure) (HCC)    a. 06/2015 Echo: EF 60-65%, mod dil LA, nl RV, mild to mod TR, PASP 46mmHg.  Marland Kitchen. DVT (deep venous thrombosis) (HCC)   . DVT of leg (deep venous thrombosis) (HCC)   . Dysrhythmia   . H/O: CVA (cerebrovascular accident)   . HOH (hard of hearing)   . Hyperlipidemia   . Hypertension   . Hypertensive heart disease   . Parkinson disease (HCC)   . Permanent atrial fibrillation    a. CHA2DS2VASc= 7-->coumadin (followed by primary care).  . Pneumonia    4/17  . Stroke [facility](HCC)     Medications:  Warfarin as outpatient  Assessment: Trop  26.62   Goal of Therapy:  Heparin level 0.3-0.7 units/ml Monitor platelets by anticoagulation protocol: Yes   Plan:  4000 unit bolus and initial rate of 800 units/hr. First heparin level 8 hours after start of infusion  12/1 @ 15:00 :     APTT = 74 , HL < 0.1  Per RN, heparin gtt has been d/c'd due to pt having hemoptysis.  Will f/u plans to restart heparin with RN.   12/1 @ 19:30:  RN called and said the MD wants to restart drip now.  Will restart Heparin gtt @ 800 units/hr and draw HL 8 hrs after restart on 12/2 @ 0400.   Tamirra Sienkiewicz D 02/14/2018,7:32 PM

## 2018-02-13 NOTE — Progress Notes (Signed)
Paged Cardiology troponin 24.64

## 2018-02-13 NOTE — Progress Notes (Signed)
Spoke with Dr Duanne LimerickSamaan APTT 74. Orders to discontinue drip, do not restart and recheck APTT in four hours. Patient is having blood tinge secretions.

## 2018-02-14 ENCOUNTER — Encounter: Payer: Self-pay | Admitting: Physician Assistant

## 2018-02-14 ENCOUNTER — Inpatient Hospital Stay: Payer: Self-pay

## 2018-02-14 DIAGNOSIS — I5021 Acute systolic (congestive) heart failure: Secondary | ICD-10-CM

## 2018-02-14 LAB — CBC WITH DIFFERENTIAL/PLATELET
Abs Immature Granulocytes: 0.03 10*3/uL (ref 0.00–0.07)
Basophils Absolute: 0 10*3/uL (ref 0.0–0.1)
Basophils Relative: 0 %
Eosinophils Absolute: 0.2 10*3/uL (ref 0.0–0.5)
Eosinophils Relative: 2 %
HCT: 39.8 % (ref 39.0–52.0)
Hemoglobin: 12.9 g/dL — ABNORMAL LOW (ref 13.0–17.0)
Immature Granulocytes: 0 %
Lymphocytes Relative: 14 %
Lymphs Abs: 1.3 10*3/uL (ref 0.7–4.0)
MCH: 29.7 pg (ref 26.0–34.0)
MCHC: 32.4 g/dL (ref 30.0–36.0)
MCV: 91.5 fL (ref 80.0–100.0)
Monocytes Absolute: 0.9 10*3/uL (ref 0.1–1.0)
Monocytes Relative: 10 %
Neutro Abs: 6.7 10*3/uL (ref 1.7–7.7)
Neutrophils Relative %: 74 %
Platelets: 160 10*3/uL (ref 150–400)
RBC: 4.35 MIL/uL (ref 4.22–5.81)
RDW: 13.3 % (ref 11.5–15.5)
WBC: 9.2 10*3/uL (ref 4.0–10.5)
nRBC: 0 % (ref 0.0–0.2)

## 2018-02-14 LAB — BASIC METABOLIC PANEL
Anion gap: 6 (ref 5–15)
BUN: 13 mg/dL (ref 8–23)
CO2: 21 mmol/L — ABNORMAL LOW (ref 22–32)
Calcium: 7.4 mg/dL — ABNORMAL LOW (ref 8.9–10.3)
Chloride: 112 mmol/L — ABNORMAL HIGH (ref 98–111)
Creatinine, Ser: 0.77 mg/dL (ref 0.61–1.24)
GFR calc Af Amer: >60 mL/min (ref >60)
GFR calc non Af Amer: >60 mL/min (ref >60)
Glucose, Bld: 107 mg/dL — ABNORMAL HIGH (ref 70–99)
Potassium: 5 mmol/L (ref 3.5–5.1)
Sodium: 139 mmol/L (ref 135–145)

## 2018-02-14 LAB — URINE CULTURE: Culture: NO GROWTH

## 2018-02-14 LAB — HEPATIC FUNCTION PANEL
ALT: 25 U/L (ref 0–44)
AST: 69 U/L — ABNORMAL HIGH (ref 15–41)
Albumin: 2.4 g/dL — ABNORMAL LOW (ref 3.5–5.0)
Alkaline Phosphatase: 56 U/L (ref 38–126)
Bilirubin, Direct: 0.3 mg/dL — ABNORMAL HIGH (ref 0.0–0.2)
Indirect Bilirubin: 0.7 mg/dL (ref 0.3–0.9)
Total Bilirubin: 1 mg/dL (ref 0.3–1.2)
Total Protein: 5.3 g/dL — ABNORMAL LOW (ref 6.5–8.1)

## 2018-02-14 LAB — GLUCOSE, CAPILLARY
Glucose-Capillary: 122 mg/dL — ABNORMAL HIGH (ref 70–99)
Glucose-Capillary: 129 mg/dL — ABNORMAL HIGH (ref 70–99)
Glucose-Capillary: 77 mg/dL (ref 70–99)
Glucose-Capillary: 81 mg/dL (ref 70–99)

## 2018-02-14 LAB — ECHOCARDIOGRAM COMPLETE
Height: 66 in
Weight: 2186.96 oz

## 2018-02-14 LAB — PHOSPHORUS: Phosphorus: 2.7 mg/dL (ref 2.5–4.6)

## 2018-02-14 LAB — HEPARIN LEVEL (UNFRACTIONATED)
Heparin Unfractionated: 0.27 IU/mL — ABNORMAL LOW (ref 0.30–0.70)
Heparin Unfractionated: 0.82 IU/mL — ABNORMAL HIGH (ref 0.30–0.70)
Heparin Unfractionated: 0.69 IU/mL (ref 0.30–0.70)

## 2018-02-14 LAB — HEMOGLOBIN AND HEMATOCRIT, BLOOD
HCT: 41.4 % (ref 39.0–52.0)
Hemoglobin: 13.1 g/dL (ref 13.0–17.0)

## 2018-02-14 LAB — POTASSIUM: Potassium: 4.2 mmol/L (ref 3.5–5.1)

## 2018-02-14 LAB — MAGNESIUM: Magnesium: 1.9 mg/dL (ref 1.7–2.4)

## 2018-02-14 LAB — TROPONIN I: Troponin I: 22.15 ng/mL (ref <0.03)

## 2018-02-14 MED ORDER — METOPROLOL TARTRATE 25 MG PO TABS
25.0000 mg | ORAL_TABLET | Freq: Two times a day (BID) | ORAL | Status: DC
  Administered 2018-02-15 – 2018-02-21 (×10): 25 mg via ORAL
  Filled 2018-02-14 (×13): qty 1

## 2018-02-14 MED ORDER — ADULT MULTIVITAMIN LIQUID CH
15.0000 mL | Freq: Every day | ORAL | Status: DC
  Administered 2018-02-14 – 2018-02-22 (×9): 15 mL
  Filled 2018-02-14 (×11): qty 15

## 2018-02-14 MED ORDER — PANTOPRAZOLE SODIUM 40 MG PO PACK
40.0000 mg | PACK | Freq: Every day | ORAL | Status: DC
  Administered 2018-02-14 – 2018-02-22 (×9): 40 mg
  Filled 2018-02-14 (×9): qty 20

## 2018-02-14 MED ORDER — ASPIRIN 81 MG PO CHEW
324.0000 mg | CHEWABLE_TABLET | Freq: Every day | ORAL | Status: DC
  Administered 2018-02-14 – 2018-02-19 (×6): 324 mg
  Filled 2018-02-14 (×6): qty 4

## 2018-02-14 MED ORDER — METOPROLOL TARTRATE 5 MG/5ML IV SOLN
2.5000 mg | INTRAVENOUS | Status: DC | PRN
  Administered 2018-02-14 – 2018-02-21 (×10): 2.5 mg via INTRAVENOUS
  Filled 2018-02-14 (×12): qty 5

## 2018-02-14 MED ORDER — HEPARIN BOLUS VIA INFUSION
1000.0000 [IU] | Freq: Once | INTRAVENOUS | Status: AC
  Administered 2018-02-14: 1000 [IU] via INTRAVENOUS
  Filled 2018-02-14: qty 1000

## 2018-02-14 MED ORDER — FUROSEMIDE 10 MG/ML IJ SOLN
20.0000 mg | Freq: Two times a day (BID) | INTRAMUSCULAR | Status: DC
  Administered 2018-02-14 – 2018-02-19 (×11): 20 mg via INTRAVENOUS
  Filled 2018-02-14 (×11): qty 2

## 2018-02-14 MED ORDER — VITAL HIGH PROTEIN PO LIQD
1000.0000 mL | ORAL | Status: DC
  Administered 2018-02-14 – 2018-02-17 (×3): 1000 mL

## 2018-02-14 MED ORDER — DIGOXIN 0.25 MG/ML IJ SOLN
0.5000 mg | Freq: Once | INTRAMUSCULAR | Status: AC
  Administered 2018-02-14: 0.5 mg via INTRAVENOUS
  Filled 2018-02-14: qty 2

## 2018-02-14 NOTE — Progress Notes (Signed)
PHARMACIST - PHYSICIAN COMMUNICATION  CONCERNING: IV to Oral Route Change Policy  RECOMMENDATION: This patient is receiving pantoprazole by the intravenous route.  Based on criteria approved by the Pharmacy and Therapeutics Committee, the intravenous medication(s) is/are being converted to the equivalent oral dose form(s).   DESCRIPTION: These criteria include:  The patient is eating (either orally or via tube) and/or has been taking other orally administered medications for a least 24 hours  The patient has no evidence of active gastrointestinal bleeding or impaired GI absorption (gastrectomy, short bowel, patient on TNA or NPO).  If you have questions about this conversion, please contact the Pharmacy Department   []   [phone]( [phone] )  [facility]  Simpson,Michael L, [facility] - KmiRPH 02/14/2018 9:09 AM

## 2018-02-14 NOTE — Progress Notes (Signed)
Follow up - Critical Care Medicine Note  Patient Details:    Adam Roberson is an 84 y.o. male.  With a past medical history remarkable for coronary artery disease, hypertension, atrial fibrillation, diastolic heart failure, presented to the emergency department via EMS secondary to aggressive respiratory failure with hypoxemia.  Patient was subsequently intubated and admitted to the intensive care unit.  Was treated for respiratory failure and pneumonia.  Also found to have NSTEMI  Lines, Airways, Drains: Airway (Active)     Airway 8 mm (Active)  Secured at (cm) 22 cm 02/14/2018  8:47 AM  Measured From Lips 02/14/2018  8:47 AM  Secured Location Center 02/14/2018  8:47 AM  Secured By [employer] T[employer] Holder 02/14/2018  8:47 AM  Tube Holder Repositioned Yes 02/14/2018  2:38 AM  Cuff Pressure (cm H2O) 27 cm H2O 02/14/2018  8:47 AM  Site Condition Dry 02/14/2018  8:47 AM     NG/OG Tube Orogastric 18 Fr. Center mouth Xray;Aucultation Documented cm marking at nare/ corner of mouth 60 cm (Active)  Cm Marking at Nare/Corner of Mouth (if applicable) 60 cm 02/14/2018  4:00 AM  Site Assessment Clean;Dry;Intact 02/14/2018  4:00 AM  Ongoing Placement Verification No change in cm markings or external length of tube from initial placement 02/14/2018  4:00 AM  Status Suction-low intermittent 02/14/2018  4:00 AM  Amount of suction 80 mmHg 02/14/2018  4:00 AM  Drainage Appearance Green 02/14/2018  4:00 AM     Urethral Catheter April RN Straight-tip 16 Fr. (Active)  Indication for Insertion or Continuance of Catheter Unstable critical patients (first 24-48 hours) 02/14/2018  4:00 AM  Site Assessment Clean;Intact 02/14/2018  4:00 AM  Catheter Maintenance Bag below level of bladder;Catheter secured;Drainage bag/tubing not touching floor;Insertion date on drainage bag;No dependent loops;Seal intact 02/14/2018  8:00 AM  Collection Container Standard drainage bag 02/14/2018  4:00 AM  Securement Method Leg strap 02/14/2018   4:00 AM  Urinary Catheter Interventions Unclamped 02/14/2018  4:00 AM  Output (mL) 100 mL 02/14/2018  5:46 AM    Anti-infectives:  Anti-infectives (From admission, onward)   Start     Dose/Rate Route Frequency Ordered Stop   02/01/18 1800  ceFEPIme (MAXIPIME) 2 g in sodium chloride 0.9 % 100 mL IVPB     2 g 200 mL/hr over 30 Minutes Intravenous Every 12 hours 02/01/18 0654     02/01/18 1600  vancomycin (VANCOCIN) IVPB 1000 mg/200 mL premix     1,000 mg 200 mL/hr over 60 Minutes Intravenous Every 18 hours 02/01/18 0831     02/01/18 0600  oseltamivir (TAMIFLU) capsule 75 mg  Status:  Discontinued     75 mg Oral  Once 02/01/18 0555 02/01/18 0758   02/01/18 0530  ceFEPIme (MAXIPIME) 2 g in sodium chloride 0.9 % 100 mL IVPB     2 g 200 mL/hr over 30 Minutes Intravenous  Once 02/01/18 0524 02/01/18 0636   02/01/18 0530  metroNIDAZOLE (FLAGYL) IVPB 500 mg  Status:  Discontinued     500 mg 100 mL/hr over 60 Minutes Intravenous Every 8 hours 02/01/18 0524 02/01/18 1109   02/01/18 0530  vancomycin (VANCOCIN) IVPB 1000 mg/200 mL premix     1,000 mg 200 mL/hr over 60 Minutes Intravenous  Once 02/01/18 0524 02/01/18 0859      Microbiology: Results for orders placed or performed during the hospital encounter of 02/01/18  Blood Culture (routine x 2)     Status: None (Preliminary result)   Collection Time: 02/01/18  5:27 AM  Result Value Ref Range Status   Specimen Description BLOOD RIGHT HAND  Final   Special Requests   Final    BOTTLES DRAWN AEROBIC AND ANAEROBIC Blood Culture adequate volume   Culture   Final    NO GROWTH <12 HOURS Performed at [facility], [address] [place], [place] [address]    Report Status PENDING  Incomplete  Blood Culture (routine x 2)     Status: None (Preliminary result)   Collection Time: 03/07/18  5:27 AM  Result Value Ref Range Status   Specimen Description BLOOD LEFT FOREARM  Final   Special Requests   Final    BOTTLES DRAWN AEROBIC AND  ANAEROBIC Blood Culture adequate volume   Culture   Final    NO GROWTH <12 HOURS Performed at [facility], [address] [place], [place] [address]    Report Status PENDING  Incomplete  Urine culture     Status: None   Collection Time: 03-07-2018  5:27 AM  Result Value Ref Range Status   Specimen Description   Final    URINE, RANDOM Performed at [facility], [address] [place], [place] [address]    Special Requests   Final    NONE Performed at [facility], [address], [place], [place] [address]    Culture   Final    NO GROWTH Performed at [facility], [address] [place], [place] [address]    Report Status 02/14/2018 FINAL  Final  MRSA PCR Screening     Status: None   Collection Time: 2018/03/07  8:32 AM  Result Value Ref Range Status   MRSA by PCR NEGATIVE NEGATIVE Final    Comment:        The GeneXpert MRSA Assay (FDA approved for NASAL specimens only), is one component of a comprehensive MRSA colonization surveillance program. It is not intended to diagnose MRSA infection nor to guide or monitor treatment for MRSA infections. Performed at [facility], [address], [place], [place] [address]   Studies: Ct Angio Chest Pe W Or Wo Contrast  Result Date: 03/07/2018 CLINICAL DATA:  Decreased oxygen saturation requiring intubation. EXAM: CT ANGIOGRAPHY CHEST WITH CONTRAST TECHNIQUE: Multidetector CT imaging of the chest was performed using the standard protocol during bolus administration of intravenous contrast. Multiplanar CT image reconstructions and MIPs were obtained to evaluate the vascular anatomy. CONTRAST:  75mL OMNIPAQUE IOHEXOL 350 MG/ML SOLN COMPARISON:  Chest radiograph Mar 07, 2018 FINDINGS: Cardiovascular: Satisfactory opacification of the pulmonary arteries to the segmental level. No evidence of pulmonary embolism. Enlarged heart. No pericardial effusion. Calcific atherosclerotic disease of the  coronary arteries and aorta. Annular calcifications of the mitral valve. Mediastinum/Nodes: Borderline enlarged bilateral mediastinal lymph nodes. The patient is intubated. Main bronchi are patent. Enteric catheter within the esophagus, terminates in the distal gastric body. Lungs/Pleura: Moderate right and small left pleural effusions. Mild interstitial pulmonary edema. Scattered ground-glass opacities throughout both lungs, with more confluent appearance in the lower lobes, left greater than right. Upper Abdomen: Right renal cyst. 18 mm hypoattenuated nodule within the spleen, likely benign. Musculoskeletal: Findings of diffuse idiopathic skeletal hyperostosis of the thoracic spine Review of the MIP images confirms the above findings. IMPRESSION: Enlarged heart with interstitial pulmonary edema and bilateral pleural effusions. Superimposed patchy airspace consolidation in both lungs, more confluent in the lower lobes, suspicious for multifocal pneumonia. Borderline mediastinal lymphadenopathy, likely reactive. Aortic Atherosclerosis (ICD10-I70.0). Electronically Signed   By: Sharlyne Pacas  Dimitrova M.D.   On: 02/16/2018 12:50   Dg Chest Port 1 View  Result Date: 02/21/2018 CLINICAL DATA:  Initial evaluation for sepsis, post intubation. EXAM: PORTABLE CHEST 1 VIEW COMPARISON:  Prior radiograph from 08/17/2017 FINDINGS: Endotracheal tube in place with tip position approximately 15 mm above the carina. Enteric tube courses into the abdomen with side hole beyond the GE junction. Median sternotomy wires underlying surgical clips noted, stable. Cardiomegaly unchanged. Mediastinal silhouette normal. Aortic atherosclerosis. Lungs are mildly hypoinflated. Diffuse pulmonary vascular and interstitial congestion, suggesting pulmonary interstitial edema. There are superimposed confluent multifocal opacities at the right mid and lower lung as well as the left lung base, suspicious for infiltrates. Associated small right pleural  effusion. No pneumothorax. No acute osseus abnormality. IMPRESSION: 1. Tip of the endotracheal tube approximately 15 mm above the carina. 2. Cardiomegaly with diffuse pulmonary vascular and interstitial congestion, suggesting pulmonary interstitial edema. 3. Superimposed multifocal opacities at the right mid and lower lung and left lung base, suspicious for infiltrates. 4. Small right pleural effusion. Electronically Signed   By: Rise Mu M.D.   On: 02/28/2018 05:57    Consults: Treatment Team:  Iran Ouch, MD   Subjective:    Overnight Issues: Significant issues overnight.  Patient blood pressure remains marginal.  Sedated on mechanical ventilation  Objective:  Vital signs for last 24 hours: Temp:  [97.8 F (36.6 C)-99.4 F (37.4 C)] 98.3 F (36.8 C) (12/02 0400) Pulse Rate:  [49-140] 110 (12/02 0600) Resp:  [0-22] 16 (12/02 0600) BP: (72-115)/(55-97) 101/78 (12/02 0600) SpO2:  [97 %-100 %] 98 % (12/02 0847) FiO2 (%):  [28 %-30 %] 28 % (12/02 0847) Weight:  [62.8 kg] 62.8 kg (12/02 0500)  Intake/Output from previous day: 12/01 0701 - 12/02 0700 In: 3417.8 [I.V.:2817.6; IV Piggyback:600.1] Out: 1265 [Urine:1265]  Intake/Output this shift: Total I/O In: 161.5 [I.V.:161.5] Out: -   Vent settings for last 24 hours: Vent Mode: PRVC FiO2 (%):  [28 %-30 %] 28 % Set Rate:  [16 bmp] 16 bmp Vt Set:  [400 mL] 400 mL PEEP:  [5 cmH20] 5 cmH20 Plateau Pressure:  [17 cmH20-20 cmH20] 18 cmH20  Physical Exam:  Vital signs: Please see the above listed vital signs HEENT: Patient is orally intubated, trachea midline, no thyromegaly appreciated Cardiovascular: Irregularly iregular rhythm with rapid ventricular response Pulmonary: Coarse rhonchi and rales appreciated diffusely Abdominal: Positive bowel sounds, soft exam Extremities: No clubbing, cyanosis or edema noted Neurologic: Limited exam as patient is on mechanical ventilation  Assessment/Plan:   Hypoxemic  respiratory failure.  Multifactorial etiology to include decompensated heart failure, bilateral effusions and interstitial edema noted on CT scan and chest x-ray.  Possible superimposed pneumonia with patchy airspace disease.  NSTEMI.  Troponin  24.64, being followed by cardiology, on aspirin and heparin.  No ischemic changes appreciated on EKG. echocardiogram performed revealed reduced ejection fraction at 35 to 40% with significant hypokinesia consistent with stress-induced cardiomyopathy versus LAD infarction  Atrial fibrillation.  Patient was loaded on digoxin per cardiology on systemic and coagulation  Critical Care Total Time. 40 minutes  Adam Roberson 02/14/2018  *Care during the described time interval was provided by me and/or other providers on the critical care team.  I have reviewed this patient's available data, including medical history, events of note, physical examination and test results as part of my evaluation.

## 2018-02-14 NOTE — Progress Notes (Signed)
Spoke with lab regarding heparin level - patient is a hard stick and RN will try to draw blood for levels. If unsuccessful, patient will receive line for access.  Mauri ReadingSavanna M Martin, PharmD Pharmacy Resident  02/14/2018 6:04 PM

## 2018-02-14 NOTE — Progress Notes (Signed)
ANTICOAGULATION CONSULT NOTE - Initial Consult  Pharmacy Consult for heparin drip Indication: chest pain/ACS  No Known Allergies  Patient Measurements: Height: 5\' 6"  (167.6 cm) Weight: 138 lb 7.2 oz (62.8 kg) IBW/kg (Calculated) : 63.8 Heparin Dosing Weight: 66 kg  Vital Signs: Temp: 99.2 F (37.3 C) (12/02 1930) Temp Source: Oral (12/02 1930) BP: 100/68 (12/02 1830) Pulse Rate: 36 (12/02 1800)  Labs: Recent Labs    03-02-18 0527 2018-03-02 0935 03-02-18 1255  02-Mar-2018 1453 03/02/18 1628 03/02/2018 1910 03/02/2018 2021 Mar 02, 2018 2356 02/14/18 0537 02/14/18 0712 02/14/18 1802 02/14/18 1938  HGB 14.9  --   --   --   --   --   --  13.0 13.1  --  12.9*  --   --   HCT 45.4  --   --   --   --   --   --  40.9 41.4  --  39.8  --   --   PLT 253  --   --   --   --   --   --   --   --   --  160  --   --   APTT  --  >160*  --   --  74*  --  46*  --   --   --   --   --   --   LABPROT  --  29.2*  --   --   --   --   --   --   --   --   --   --   --   INR  --  2.81  --   --   --   --   --   --   --   --   --   --   --   HEPARINUNFRC  --   --   --    < > <0.10*  --   --   --   --  0.27*  --  0.82* 0.69  CREATININE 0.96  --   --   --   --   --   --   --   --   --  0.77  --   --   TROPONINI 26.62*  --  19.25*  --   --  24.64*  --   --   --   --   --  22.15*  --    < > = values in this interval not displayed.    Estimated Creatinine Clearance: 57.8 mL/min (by C-G formula based on SCr of 0.77 mg/dL).   Medical History: Past Medical History:  Diagnosis Date  . CAD (coronary artery disease)    a.2003 s/p CABG x3 in [place]; b. ? 2014 PCI (records not available).  . Chronic diastolic CHF (congestive heart failure) (HCC)    a. 06/2015 Echo: EF 60-65%, mod dil LA, nl RV, mild to mod TR, PASP .  Marland Kitchen DVT (deep venous thrombosis) (HCC)   . DVT of leg (deep venous thrombosis) (HCC)   . H/O: CVA (cerebrovascular accident)   . HOH (hard of hearing)   . Hyperlipidemia   . Hypertension   .  Hypertensive heart disease   . Parkinson disease (HCC)   . Permanent atrial fibrillation    a. CHA2DS2VASc= 7-->coumadin (followed by primary care).  . Pneumonia    4/17    Medications:  Warfarin as outpatient  Assessment: Trop  26.62   Goal of Therapy:  Heparin level 0.3-0.7 units/ml Monitor platelets by anticoagulation protocol: Yes   Plan:  4000 unit bolus and initial rate of 800 units/hr. First heparin level 8 hours after start of infusion  12/1 @ 15:00 :    APTT = 74 , HL < 0.1  Per RN, heparin gtt has been d/c'd due to pt having hemoptysis.  Will f/u plans to restart heparin with RN.   12/1 @ 19:30:  RN called and said the MD wants to restart drip now.  Will restart Heparin gtt @ 800 units/hr and draw HL 8 hrs after restart on 12/2 @ 0400.   12/2 AM heparin level 0.27. 1000 unit bolus and increase rate to 950 units/hr. Recheck in 6 hours.  12/2 :  HL @ 18:00 = 0.82.   Spoke with RN , he says he drew the HL from the same line heparin was infusing through.  Will repeat HL stat.   12/2: HL @ 19:38 = 0.69 .    This level was drawn from arm opposite from the one heparin was infusing in so will assume it to be valid. Will continue this pt on current rate and draw confirmation level in 8 hrs.   Shadiyah Wernli D 02/14/2018,8:05 PM

## 2018-02-14 NOTE — Progress Notes (Signed)
ANTICOAGULATION CONSULT NOTE - Initial Consult  Pharmacy Consult for heparin drip Indication: chest pain/ACS  No Known Allergies  Patient Measurements: Height: 5\' 6"  (167.6 cm) Weight: 138 lb 7.2 oz (62.8 kg) IBW/kg (Calculated) : 63.8 Heparin Dosing Weight: 66 kg  Vital Signs: Temp: 98.3 F (36.8 C) (12/02 1600) Temp Source: Oral (12/02 1600) BP: 100/68 (12/02 1830) Pulse Rate: 36 (12/02 1800)  Labs: Recent Labs    02/21/2018 0527 03/12/2018 0935 03/09/2018 1255 02/16/2018 1453 03/05/2018 1628 02/21/2018 1910 02/15/2018 2021 02/21/2018 2356 02/14/18 0537 02/14/18 0712 02/14/18 1802  HGB 14.9  --   --   --   --   --  13.0 13.1  --  12.9*  --   HCT 45.4  --   --   --   --   --  40.9 41.4  --  39.8  --   PLT 253  --   --   --   --   --   --   --   --  160  --   APTT  --  >160*  --  74*  --  46*  --   --   --   --   --   LABPROT  --  29.2*  --   --   --   --   --   --   --   --   --   INR  --  2.81  --   --   --   --   --   --   --   --   --   HEPARINUNFRC  --   --   --  <0.10*  --   --   --   --  0.27*  --  0.82*  CREATININE 0.96  --   --   --   --   --   --   --   --  0.77  --   TROPONINI 26.62*  --  19.25*  --  24.64*  --   --   --   --   --  22.15*    Estimated Creatinine Clearance: 57.8 mL/min (by C-G formula based on SCr of 0.77 mg/dL).   Medical History: Past Medical History:  Diagnosis Date  . CAD (coronary artery disease)    a.2003 s/p CABG x3 in [place]; b. ? 2014 PCI (records not available).  . Chronic diastolic CHF (congestive heart failure) (HCC)    a. 06/2015 Echo: EF 60-65%, mod dil LA, nl RV, mild to mod TR, PASP .  Marland Kitchen DVT (deep venous thrombosis) (HCC)   . DVT of leg (deep venous thrombosis) (HCC)   . H/O: CVA (cerebrovascular accident)   . HOH (hard of hearing)   . Hyperlipidemia   . Hypertension   . Hypertensive heart disease   . Parkinson disease (HCC)   . Permanent atrial fibrillation    a. CHA2DS2VASc= 7-->coumadin (followed by primary care).  .  Pneumonia    4/17    Medications:  Warfarin as outpatient  Assessment: Trop  26.62   Goal of Therapy:  Heparin level 0.3-0.7 units/ml Monitor platelets by anticoagulation protocol: Yes   Plan:  4000 unit bolus and initial rate of 800 units/hr. First heparin level 8 hours after start of infusion  12/1 @ 15:00 :    APTT = 74 , HL < 0.1  Per RN, heparin gtt has been d/c'd due to pt having hemoptysis.  Will f/u plans to restart  heparin with RN.   12/1 @ 19:30:  RN called and said the MD wants to restart drip now.  Will restart Heparin gtt @ 800 units/hr and draw HL 8 hrs after restart on 12/2 @ 0400.   12/2 AM heparin level 0.27. 1000 unit bolus and increase rate to 950 units/hr. Recheck in 6 hours.  12/2 :  HL @ 18:00 = 0.82.   Spoke with RN , he says he drew the HL from the same line heparin was infusing through.  Will repeat HL stat.  Olympia Adelsberger D 02/14/2018,7:36 PM

## 2018-02-14 NOTE — Progress Notes (Signed)
IV RN  Spoke with patient RN Romie JumperChelsey to inform her that patient PICC line would be placed 12/3 in the early am.  Patient RN verbalized understanding.

## 2018-02-14 NOTE — Progress Notes (Signed)
Progress Note  Patient Name: Adam Roberson [facility] Date of Encounter: 02/14/2018  Primary Cardiologist: [place] intubated and sedated. Troponin trending up to 24.64 as of 03/03/2018. In Afib with RVR over the past 24 hours with ventricular rates in the 110s to 140s bpm.   Inpatient Medications    Scheduled Meds: . aspirin  300 mg Rectal Daily  . chlorhexidine gluconate (MEDLINE KIT)  15 mL Mouth Rinse BID  . docusate sodium  100 mg Oral BID  . mouth rinse  15 mL Mouth Rinse 10 times per day  . pantoprazole (PROTONIX) IV  40 mg Intravenous Q24H   Continuous Infusions: . sodium chloride 125 mL/hr at 02/14/18 0742  . ceFEPime (MAXIPIME) IV Stopped (02/14/18 0603)  . fentaNYL infusion INTRAVENOUS 25 mcg/hr (02/14/18 0742)  . heparin 950 Units/hr (02/14/18 0742)  . propofol (DIPRIVAN) infusion 30 mcg/kg/min (02/14/18 0742)  . vancomycin 1,000 mg (03/01/2018 1659)   PRN Meds: acetaminophen **OR** acetaminophen, metoprolol tartrate, ondansetron **OR** ondansetron (ZOFRAN) IV   Vital Signs    Vitals:   02/14/18 0315 02/14/18 0400 02/14/18 0500 02/14/18 0600  BP: (!) 96/55 102/77 94/75 101/78  Pulse: (!) 101 (!) 127 90 (!) 110  Resp: 16 16 (!) 0 16  Temp:  98.3 F (36.8 C)    TempSrc:  Oral    SpO2: 99% 99% 99% 100%  Weight:   62.8 kg   Height:        Intake/Output Summary (Last 24 hours) at 02/14/2018 0810 Last data filed at 02/14/2018 0742 Gross per 24 hour  Intake 3379.26 ml  Output 1265 ml  Net 2114.26 ml   Filed Weights   03/11/2018 0522 02/20/2018 0829 02/14/18 0500  Weight: 66.2 kg 62 kg 62.8 kg    Telemetry    Afib with RVR, 110s to 140s bpm, currently in the 130s bpm- Personally Reviewed  ECG    n/a - Personally Reviewed  Physical Exam   GEN: No acute distress. Intubated and sedated.    Neck: No JVD. Cardiac: Tachycardic, irregularly irregular, no murmurs, rubs, or gallops.  Respiratory: Diminished breath sounds bilaterally,  intubated.  GI: Soft, nontender, non-distended.   MS: No edema; No deformity. Neuro:  Intubated and sedated.  Psych: Intubated and sedated.  Labs    Chemistry Recent Labs  Lab 02/23/2018 0527 02/14/18 0712  NA 138 139  K 3.3* 5.0  CL 100 112*  CO2 27 21*  GLUCOSE 172* 107*  BUN 20 13  CREATININE 0.96 0.77  CALCIUM 8.7* 7.4*  PROT 7.7 5.3*  ALBUMIN 3.6 2.4*  AST 102* 69*  ALT 17 25  ALKPHOS 86 56  BILITOT 0.6 1.0  GFRNONAA >60 >60  GFRAA >60 >60  ANIONGAP 11 6     Hematology Recent Labs  Lab 03/03/2018 0527 03/10/2018 2021 02/23/2018 2356 02/14/18 0712  WBC 12.5*  --   --  9.2  RBC 5.06  --   --  4.35  HGB 14.9 13.0 13.1 12.9*  HCT 45.4 40.9 41.4 39.8  MCV 89.7  --   --  91.5  MCH 29.4  --   --  29.7  MCHC 32.8  --   --  32.4  RDW 13.1  --   --  13.3  PLT 253  --   --  160    Cardiac Enzymes Recent Labs  Lab 03/03/2018 0527 02/23/2018 1255 03/03/2018 1628  TROPONINI 26.62* 19.25* 24.64*   No results for input(s):  TROPIPOC in the last 168 hours.   BNPNo results for input(s): BNP, PROBNP in the last 168 hours.   DDimer No results for input(s): DDIMER in the last 168 hours.   Radiology    Ct Angio Chest Pe W Or Wo Contrast  Result Date: 02/16/2018 IMPRESSION: Enlarged heart with interstitial pulmonary edema and bilateral pleural effusions. Superimposed patchy airspace consolidation in both lungs, more confluent in the lower lobes, suspicious for multifocal pneumonia. Borderline mediastinal lymphadenopathy, likely reactive. Aortic Atherosclerosis (ICD10-I70.0). Electronically Signed   By: Fidela Salisbury M.D.   On: 03/05/2018 12:50   Dg Chest Port 1 View  Result Date: 03/01/2018 IMPRESSION: 1. Tip of the endotracheal tube approximately 15 mm above the carina. 2. Cardiomegaly with diffuse pulmonary vascular and interstitial congestion, suggesting pulmonary interstitial edema. 3. Superimposed multifocal opacities at the right mid and lower lung and left lung  base, suspicious for infiltrates. 4. Small right pleural effusion. Electronically Signed   By: Jeannine Boga M.D.   On: 03/08/2018 05:57    Cardiac Studies   Echo 03/09/2018: Study Conclusions  - Left ventricle: The cavity size was normal. There was mild   concentric hypertrophy. Systolic function was moderately reduced.   The estimated ejection fraction was in the range of 35% to 40%.   Akinesis of the mid-apicalanteroseptal, anterior, inferior,   inferoseptal, and apical myocardium. - Aortic valve: There was mild regurgitation. - Mitral valve: Calcified annulus. There was moderate   regurgitation. - Left atrium: The atrium was mildly dilated. - Tricuspid valve: There was moderate regurgitation. - Pulmonary arteries: Systolic pressure was mildly to moderately   increased. PA peak pressure: 44 mm Hg (S).  Impressions:  - Wall motion abnormalities suggestive of stress induced   cardiomyopathy vs. LAD infarct .  Patient Profile     84 y.o. male with history of CAD s/p 3-vessel CABG in [place], permanent Afib on Coumadin, HFpEF, CVA, DVT, HTN, HLD dementia, and Parkinson's disease who we are asked to evaluate for elevated troponin in the setting of acute respiratory failure with hypoxia in the setting of suspected ARDS/PNA/acute systolic and acute on chronic diastolic CHF.   Assessment & Plan    1. NSTEMI: -Troponin trended up to 24.64 on 12/1, continue to cycle until down trending -Echo with newly reduced LVSF with WMA concerning for stress-induced CM vs LAD infarct -Remains on heparin gtt, would continue as part of medical management for 48 hours as long as blood-tinged sputum from his ET tube remains stable (12/3) -Urgent cath has been deferred given it appears his symptoms began 2 days prior to presentation -Given the patient's advanced age and comorbid conditions, including underlying dementia, he may not be the best candidate for invasive cardiac procedures at this time.  We will need to visit with the family when they arrive (they speak [place] and [place])  2. Acute systolic CHF/acute on chronic diastolic CHF: -Gentle diuresis with IV Lasix 20 mg bid with KCl repletion as indicated -Echo showed a reduced LVSF with an EF of 35-40% with WMA concerning for stress-induced cardiomyopathy vs LAD infarct -Not currently on a beta blocker, ACEi/ARB/spironolactone/Entresto secondary to relative hypotension -Escalate evidence-based heart failure therapy as able  3. Acute respiratory failure with hypoxia: -Multifactorial including likely PNA and acute systolic CHF -Currently on 30% FIO2 -Wean mechanical ventilation as able per PCCM  4. Permanent Afib with RVR: -Ventricular rates remain tachycardic over the past 24 hours -Discussed with MD, load with IV digoxin 0.5 mg today -Unable  to add beta blocker given his relative hypotension -Not a good candidate for calcium channel blockers given his new cardiomyopathy and relative hypotension -Try to avoid amiodarone unless needed for rate control -Remains on heparin gtt in place of Coumadin at this time until it is certain the patient will not require invasive procedures in the near future  -Will need to transition back to [facility] once it is clear no invasive procedures are needed   5. HTN: -Has been on the soft side as above  6. Hypoalbuminemia: -Contributing to 3rd spacing  7. Hypokalemia: -Repleted -Most recent potassium noted to be in the setting of a hemolyzed specimen -Recommend recollecting potassium    For questions or updates, please contact [place] Please consult [email or web address] for contact info under Cardiology/STEMI.    Signed, Christell Faith, PA-C [place] Pager: [phone] 02/14/2018, 8:10 AM

## 2018-02-14 NOTE — Progress Notes (Signed)
Initial Nutrition Assessment  DOCUMENTATION CODES:   Not applicable  INTERVENTION:  Initiate Vital High Protein at 45 mL/hr (1080 mL goal daily volume) per OGT. Provides 1080 kcal, 95 grams of protein, 907 mL H2O daily. With current propofol rate provides 1394 kcal daily.  Provide liquid MVI daily per tube.  Provide free water flush of 30 mL Q4hrs to maintain tube patency.  NUTRITION DIAGNOSIS:   Inadequate oral intake related to inability to eat as evidenced by NPO status.  GOAL:   Provide needs based on ASPEN/SCCM guidelines  MONITOR:   Vent status, Labs, Weight trends, TF tolerance, I & O's  REASON FOR ASSESSMENT:   Ventilator    ASSESSMENT:   84 year old male with PMHx of CAD s/p CABG x 3 in [place], Parkinson's disease, HTN, HLD, hx DVT, hx CVA, chronic diastolic CHF, A-fib who is admitted with hypoxic respiratory failure related to decompensated heart failure, bilateral effusions, interstitial edema, possible superimposed PNA requiring intubation on 12/1, also with NSTEMI, and a reduced ejection fraction of 35-40% on echo 12/1.   Patient is intubated and sedated. On PRVC mode with FiO2 28% and PEEP 5 cmH2O. Abdomen is soft. Family members at bedside report that patient had a good appetite PTA and was weight-stable. He is unable to ambulate independently and requires assistance from wife, which likely explains muscle wasting found on lower extremities. Per chart weight has fluctuated between 62.8-79.4 kg over the past year. Unsure if this is related to fluid status changes or inaccurate weights. He is currently 62.8 kg (138.45 lbs).  Enteral Access: 18 Fr. OGT placed 12/1; terminates in stomach per chest x-ray 12/1; 60 cm at corner of mouth  MAP: 69-91 mmHg  Patient is currently intubated on ventilator support Ve: 5.4 L/min Temp (24hrs), Avg:98.8 F (37.1 C), Min:97.8 F (36.6 C), Max:99.4 F (37.4 C)  Propofol: 11.9 mL/hr (314 kcal daily)  Medications reviewed and  include: Colace 100 mg BID, Lasix 20 mg BID IV, Protonix 40 mg daily per tube, NS @ 125 mL/hr, cefepime, fentanyl gtt, heparin gtt, propofol gtt.  Labs reviewed: Chloride 112, CO2 21.  I/O: 1265 mL UOP yesterday (0.8 mL/kg/hr)  Patient does not meet criteria for malnutrition at this time.  Discussed with RN and on rounds. Plan is to start tube feeds today.  NUTRITION - FOCUSED PHYSICAL EXAM:    Most Recent Value  Orbital Region  No depletion  Upper Arm Region  No depletion  Thoracic and Lumbar Region  No depletion  Buccal Region  Unable to assess  Temple Region  No depletion  Clavicle Bone Region  No depletion  Clavicle and Acromion Bone Region  No depletion  Scapular Bone Region  Unable to assess  Dorsal Hand  No depletion  Patellar Region  Mild depletion  Anterior Thigh Region  Mild depletion  Posterior Calf Region  Mild depletion  Edema (RD Assessment)  None  Hair  Reviewed  Eyes  Unable to assess  Mouth  Unable to assess  Skin  Reviewed  Nails  Reviewed     Diet Order:   Diet Order            Diet NPO time specified  Diet effective now             EDUCATION NEEDS:   No education needs have been identified at this time  Skin:  Skin Assessment: Reviewed RN Assessment(scattered ecchymosis)  Last BM:  Unknown/PTA  Height:   Ht Readings from Last  1 Encounters:  03/11/2018 5\' 6"  (1.676 m)   Weight:   Wt Readings from Last 1 Encounters:  02/14/18 62.8 kg   Ideal Body Weight:  64.5 kg  BMI:  Body mass index is 22.35 kg/m.  Estimated Nutritional Needs:   Kcal:  1403 (PSU 2003b w/ MSJ 1252, Ve 5.4, Tmax 37.4)  Protein:  75-95 grams (1.2-1.5 grams/kg)  Fluid:  1.5 L/day (25 mL/kg)  Helane RimaLeanne Christophe Rising, MS, RD, LDN Office: [phone] Pager: [phone] After Hours/Weekend Pager: [phone]

## 2018-02-14 NOTE — Progress Notes (Addendum)
[facility] - [place] [facility]      PATIENT NAME: Adam Roberson    MR#:  [id number]  DATE OF BIRTH:  [birth year]  SUBJECTIVE:   Patient admitted to the hospital secondary to shortness of breath and respiratory distress and noted to be in congestive heart failure.  Patient remains intubated and sedated.  Also noted to have a non-ST elevation MI.  patient was noted to be in atrial fibrillation with rapid ventricular response.  Family at bedside.  REVIEW OF SYSTEMS:    Review of Systems  Unable to perform ROS: Intubated    Nutrition: NPO Tolerating Diet: Tube feeds Tolerating PT: Await Eval.    DRUG ALLERGIES:  No Known Allergies  VITALS:  Blood pressure 90/71, pulse (!) 120, temperature 98.4 F (36.9 C), temperature source Oral, resp. rate 16, height 5\' 6"  (1.676 m), weight 62.8 kg, SpO2 98 %.  PHYSICAL EXAMINATION:   Physical Exam  GENERAL:  84 y.o.-year-old patient lying in bed intubated & sedated.   EYES: Pupils equal, round, reactive to light. No scleral icterus.   HEENT: Head atraumatic, normocephalic. ET and oG tubes in place.  NECK:  Supple, no jugular venous distention. No thyroid enlargement, no tenderness.  LUNGS: Normal breath sounds bilaterally, no wheezing, rales, upper airway rhonchi b/l . No use of accessory muscles of respiration.  CARDIOVASCULAR: S1, S2 normal. No murmurs, rubs, or gallops.  ABDOMEN: Soft, nontender, nondistended. Bowel sounds present. No organomegaly or mass.  EXTREMITIES: No cyanosis, clubbing or edema b/l.    NEUROLOGIC: SEdated & Intubated.  PSYCHIATRIC: Sedated & Intubated.  SKIN: No obvious rash, lesion, or ulcer.    LABORATORY PANEL:   CBC Recent Labs  Lab 02/14/18 0712  WBC 9.2  HGB 12.9*  HCT 39.8  PLT 160   ------------------------------------------------------------------------------------------------------------------  Chemistries  Recent Labs  Lab 02/14/18 0712  NA 139  K 5.0  CL  112*  CO2 21*  GLUCOSE 107*  BUN 13  CREATININE 0.77  CALCIUM 7.4*  MG 1.9  AST 69*  ALT 25  ALKPHOS 56  BILITOT 1.0   ------------------------------------------------------------------------------------------------------------------  Cardiac Enzymes Recent Labs  Lab 03/03/2018 1628  TROPONINI 24.64*   ------------------------------------------------------------------------------------------------------------------  RADIOLOGY:  Ct Angio Chest Pe W Or Wo Contrast  Result Date: 02/16/2018 CLINICAL DATA:  Decreased oxygen saturation requiring intubation. EXAM: CT ANGIOGRAPHY CHEST WITH CONTRAST TECHNIQUE: Multidetector CT imaging of the chest was performed using the standard protocol during bolus administration of intravenous contrast. Multiplanar CT image reconstructions and MIPs were obtained to evaluate the vascular anatomy. CONTRAST:  75mL OMNIPAQUE IOHEXOL 350 MG/ML SOLN COMPARISON:  Chest radiograph 02/18/2018 FINDINGS: Cardiovascular: Satisfactory opacification of the pulmonary arteries to the segmental level. No evidence of pulmonary embolism. Enlarged heart. No pericardial effusion. Calcific atherosclerotic disease of the coronary arteries and aorta. Annular calcifications of the mitral valve. Mediastinum/Nodes: Borderline enlarged bilateral mediastinal lymph nodes. The patient is intubated. Main bronchi are patent. Enteric catheter within the esophagus, terminates in the distal gastric body. Lungs/Pleura: Moderate right and small left pleural effusions. Mild interstitial pulmonary edema. Scattered ground-glass opacities throughout both lungs, with more confluent appearance in the lower lobes, left greater than right. Upper Abdomen: Right renal cyst. 18 mm hypoattenuated nodule within the spleen, likely benign. Musculoskeletal: Findings of diffuse idiopathic skeletal hyperostosis of the thoracic spine Review of the MIP images confirms the above findings. IMPRESSION: Enlarged heart with  interstitial pulmonary edema and bilateral pleural effusions. Superimposed patchy airspace consolidation in both lungs, more confluent  in the lower lobes, suspicious for multifocal pneumonia. Borderline mediastinal lymphadenopathy, likely reactive. Aortic Atherosclerosis (ICD10-I70.0). Electronically Signed   By: Ted Mcalpine M.D.   On: 02/19/2018 12:50   Dg Chest Port 1 View  Result Date: 02/28/2018 CLINICAL DATA:  Initial evaluation for sepsis, post intubation. EXAM: PORTABLE CHEST 1 VIEW COMPARISON:  Prior radiograph from 08/17/2017 FINDINGS: Endotracheal tube in place with tip position approximately 15 mm above the carina. Enteric tube courses into the abdomen with side hole beyond the GE junction. Median sternotomy wires underlying surgical clips noted, stable. Cardiomegaly unchanged. Mediastinal silhouette normal. Aortic atherosclerosis. Lungs are mildly hypoinflated. Diffuse pulmonary vascular and interstitial congestion, suggesting pulmonary interstitial edema. There are superimposed confluent multifocal opacities at the right mid and lower lung as well as the left lung base, suspicious for infiltrates. Associated small right pleural effusion. No pneumothorax. No acute osseus abnormality. IMPRESSION: 1. Tip of the endotracheal tube approximately 15 mm above the carina. 2. Cardiomegaly with diffuse pulmonary vascular and interstitial congestion, suggesting pulmonary interstitial edema. 3. Superimposed multifocal opacities at the right mid and lower lung and left lung base, suspicious for infiltrates. 4. Small right pleural effusion. Electronically Signed   By: Rise Mu M.D.   On: 03/11/2018 05:57     ASSESSMENT AND PLAN:   84 year old male with past medical history of consents disease, atrial fibrillation, hypertension, hyperlipidemia, history of previous CVA, previous history of DVT, chronic diastolic CHF who presented to the hospital due to shortness of breath and chest  pain.  1.  Acute respiratory failure with hypoxia- secondary to a combination of mild CHF, pneumonia -Patient is currently intubated and sedated.  Continue vent support as per intensivist. -Continue broad-spectrum IV antibiotics with vancomycin, cefepime. - cont. Diuresis with IV lasix.   2.  Non-ST elevation MI-patient presented to the hospital complaining of vague chest pain for the past 2 days prior to admission.  She has ruled in by cardiac markers. - Seen by cardiology and echocardiogram reviewed showing stress-induced cardiomyopathy or possibly LAD disease. - Seen by cardiology, continue heparin drip for now. - cont. Low dose Metoprolol.    3.  Atrial fibrillation with rapid ventricle response-patient's heart rates were somewhat labile this morning.  Patient was loaded with IV digoxin, continue oral metoprolol and IV metoprolol as needed. - cont. Heparin gtt.   - appreciate Cardiology input.   4.  History of previous DVT- continue heparin drip for now.  5.  History of Parkinson's disease-we will resume Sinemet once patient can take p.o.  6.  Essential hypertension- cont. Low dose Metoprolol.   7.  Acute on chronic systolic CHF- cont. IV diuresis with low-dose Lasix, follow I's and O's and daily weights.  EchoCardiogram done showing LV dysfunction with EF of 35 to 40%. -Continue low-dose metoprolol.   All the records are reviewed and case discussed with Care Management/Social Worker. Management plans discussed with the patient, family and they are in agreement.  CODE STATUS: Full code  DVT Prophylaxis: Heparin gtt  TOTAL TIME TAKING CARE OF THIS PATIENT: 30 minutes.   POSSIBLE D/C IN 3-4 DAYS, DEPENDING ON CLINICAL CONDITION.   Houston Siren M.D on 02/14/2018 at 3:05 PM  Between 7am to 6pm - Pager - [phone]  After 6pm go to [email or web address] - Therapist, nutritional Hospitalists  Office  [phone]  CC: Primary care physician;  Raynelle Bring

## 2018-02-14 NOTE — Progress Notes (Signed)
ANTICOAGULATION CONSULT NOTE - Initial Consult  Pharmacy Consult for heparin drip Indication: chest pain/ACS  No Known Allergies  Patient Measurements: Height: 5\' 6"  (167.6 cm) Weight: 138 lb 7.2 oz (62.8 kg) IBW/kg (Calculated) : 63.8 Heparin Dosing Weight: 66 kg  Vital Signs: Temp: 98.3 F (36.8 C) (12/02 0400) Temp Source: Oral (12/02 0400) BP: 101/78 (12/02 0600) Pulse Rate: 110 (12/02 0600)  Labs: Recent Labs    03-05-2018 0527 03/05/18 0935 Mar 05, 2018 1255 2018-03-05 1453 Mar 05, 2018 1628 03-05-2018 1910 03-05-2018 2021 2018-03-05 2356 02/14/18 0537  HGB 14.9  --   --   --   --   --  13.0 13.1  --   HCT 45.4  --   --   --   --   --  40.9 41.4  --   PLT 253  --   --   --   --   --   --   --   --   APTT  --  >160*  --  74*  --  46*  --   --   --   LABPROT  --  29.2*  --   --   --   --   --   --   --   INR  --  2.81  --   --   --   --   --   --   --   HEPARINUNFRC  --   --   --  <0.10*  --   --   --   --  0.27*  CREATININE 0.96  --   --   --   --   --   --   --   --   TROPONINI 26.62*  --  19.25*  --  24.64*  --   --   --   --     Estimated Creatinine Clearance: 48.2 mL/min (by C-G formula based on SCr of 0.96 mg/dL).   Medical History: Past Medical History:  Diagnosis Date  . CAD (coronary artery disease)    a.2003 Roberson/p CABG x3 in [place]; b. ? 2014 PCI (records not available).  . Chronic diastolic CHF (congestive heart failure) (HCC)    a. 06/2015 Echo: EF 60-65%, mod dil LA, nl RV, mild to mod TR, PASP .  Marland Kitchen DVT (deep venous thrombosis) (HCC)   . DVT of leg (deep venous thrombosis) (HCC)   . Dysrhythmia   . H/O: CVA (cerebrovascular accident)   . HOH (hard of hearing)   . Hyperlipidemia   . Hypertension   . Hypertensive heart disease   . Parkinson disease (HCC)   . Permanent atrial fibrillation    a. CHA2DS2VASc= 7-->coumadin (followed by primary care).  . Pneumonia    4/17  . Stroke [facility])     Medications:  Warfarin as outpatient  Assessment: Trop   26.62   Goal of Therapy:  Heparin level 0.3-0.7 units/ml Monitor platelets by anticoagulation protocol: Yes   Plan:  4000 unit bolus and initial rate of 800 units/hr. First heparin level 8 hours after start of infusion  12/1 @ 15:00 :    APTT = 74 , HL < 0.1  Per RN, heparin gtt has been d/c'd due to pt having hemoptysis.  Will f/u plans to restart heparin with RN.   12/1 @ 19:30:  RN called and said the MD wants to restart drip now.  Will restart Heparin gtt @ 800 units/hr and draw HL 8 hrs after restart on 12/2 @  0400.   12/2 AM heparin level 0.27. 1000 unit bolus and increase rate to 950 units/hr. Recheck in 6 hours.  Adam Roberson 02/14/2018,7:03 AM

## 2018-02-14 NOTE — Consult Note (Addendum)
Pharmacy Antibiotic Note  Adam Roberson is a 84 y.o. male admitted on 03/06/2018 with sepsis secondary to respiratory failure. Patient presented to the emergency departed through EMS. Patient admitted to the ICU and is requiring mechanical ventilation. Pharmacy has been consulted for Cefepime dosing.  Plan: Continue Cefepime 2g IV every 12 hours.   Per am rounds, will discontinue vancomycin secondary to negative MRSA PCR.    Height: 5\' 6"  (167.6 cm) Weight: 138 lb 7.2 oz (62.8 kg) IBW/kg (Calculated) : 63.8  Temp (24hrs), Avg:98.4 F (36.9 C), Min:97.8 F (36.6 C), Max:99.2 F (37.3 C)  Recent Labs  Lab 03/02/2018 0527 02/17/2018 0543 03/13/2018 0935 03/11/2018 1255 03/13/2018 1628 02/14/18 0712  WBC 12.5*  --   --   --   --  9.2  CREATININE 0.96  --   --   --   --  0.77  LATICACIDVEN  --  2.85* 2.6* 1.8 1.3  --     Estimated Creatinine Clearance: 57.8 mL/min (by C-G formula based on SCr of 0.77 mg/dL).    No Known Allergies  Antimicrobials this admission: Metronidazole 12/1 x 1 Vancomycin 12/1 >> 12/2 Cefepime 12/1 >>   Dose adjustments this admission:  Microbiology results: 12/1 BCx: no growth x 1 12/1 UCx: no growth  12/1  MRSA PCR: negative   Thank you for allowing pharmacy to be a part of this patient's care.  Adam Roberson, 02/14/2018 8:18 PM

## 2018-02-15 ENCOUNTER — Inpatient Hospital Stay: Payer: Medicaid Other

## 2018-02-15 DIAGNOSIS — I4891 Unspecified atrial fibrillation: Secondary | ICD-10-CM

## 2018-02-15 DIAGNOSIS — I5023 Acute on chronic systolic (congestive) heart failure: Secondary | ICD-10-CM

## 2018-02-15 LAB — GLUCOSE, CAPILLARY
Glucose-Capillary: 112 mg/dL — ABNORMAL HIGH (ref 70–99)
Glucose-Capillary: 143 mg/dL — ABNORMAL HIGH (ref 70–99)
Glucose-Capillary: 95 mg/dL (ref 70–99)
Glucose-Capillary: 131 mg/dL — ABNORMAL HIGH (ref 70–99)
Glucose-Capillary: 98 mg/dL (ref 70–99)
Glucose-Capillary: 132 mg/dL — ABNORMAL HIGH (ref 70–99)
Glucose-Capillary: 136 mg/dL — ABNORMAL HIGH (ref 70–99)

## 2018-02-15 LAB — COMPREHENSIVE METABOLIC PANEL
ALT: 28 U/L (ref 0–44)
AST: 99 U/L — ABNORMAL HIGH (ref 15–41)
Albumin: 2.4 g/dL — ABNORMAL LOW (ref 3.5–5.0)
Alkaline Phosphatase: 59 U/L (ref 38–126)
Anion gap: 7 (ref 5–15)
BUN: 16 mg/dL (ref 8–23)
CO2: 26 mmol/L (ref 22–32)
CREATININE: 1.09 mg/dL (ref 0.61–1.24)
Calcium: 7 mg/dL — ABNORMAL LOW (ref 8.9–10.3)
Chloride: 107 mmol/L (ref 98–111)
GFR calc Af Amer: >60 mL/min (ref >60)
GFR calc non Af Amer: >60 mL/min (ref >60)
Glucose, Bld: 141 mg/dL — ABNORMAL HIGH (ref 70–99)
Potassium: 3.1 mmol/L — ABNORMAL LOW (ref 3.5–5.1)
Sodium: 140 mmol/L (ref 135–145)
Total Bilirubin: 0.4 mg/dL (ref 0.3–1.2)
Total Protein: 5.5 g/dL — ABNORMAL LOW (ref 6.5–8.1)

## 2018-02-15 LAB — HEPARIN LEVEL (UNFRACTIONATED): Heparin Unfractionated: 0.49 IU/mL (ref 0.30–0.70)

## 2018-02-15 LAB — DIGOXIN LEVEL: Digoxin Level: 0.6 ng/mL — ABNORMAL LOW (ref 0.8–2.0)

## 2018-02-15 LAB — MAGNESIUM: Magnesium: 2 mg/dL (ref 1.7–2.4)

## 2018-02-15 LAB — POTASSIUM: Potassium: 4.3 mmol/L (ref 3.5–5.1)

## 2018-02-15 LAB — PHOSPHORUS: Phosphorus: 2.5 mg/dL (ref 2.5–4.6)

## 2018-02-15 MED ORDER — POTASSIUM CHLORIDE 20 MEQ/15ML (10%) PO SOLN
30.0000 meq | ORAL | Status: AC
  Administered 2018-02-15 (×2): 30 meq
  Filled 2018-02-15 (×2): qty 30

## 2018-02-15 MED ORDER — DEXMEDETOMIDINE HCL IN NACL 400 MCG/100ML IV SOLN
0.4000 ug/kg/h | INTRAVENOUS | Status: DC
  Administered 2018-02-15: 0.6 ug/kg/h via INTRAVENOUS
  Administered 2018-02-16: 0.8 ug/kg/h via INTRAVENOUS
  Administered 2018-02-16 – 2018-02-18 (×3): 0.4 ug/kg/h via INTRAVENOUS
  Administered 2018-02-19: 0.6 ug/kg/h via INTRAVENOUS
  Administered 2018-02-19 – 2018-02-20 (×2): 0.4 ug/kg/h via INTRAVENOUS
  Administered 2018-02-20: 0.6 ug/kg/h via INTRAVENOUS
  Administered 2018-02-21: 1.2 ug/kg/h via INTRAVENOUS
  Administered 2018-02-21: 0.4 ug/kg/h via INTRAVENOUS
  Administered 2018-02-22: 1.2 ug/kg/h via INTRAVENOUS
  Filled 2018-02-15 (×12): qty 100

## 2018-02-15 MED ORDER — DOCUSATE SODIUM 50 MG/5ML PO LIQD
100.0000 mg | Freq: Two times a day (BID) | ORAL | Status: DC
  Administered 2018-02-15 – 2018-02-22 (×15): 100 mg
  Filled 2018-02-15 (×14): qty 10

## 2018-02-15 MED ORDER — SODIUM CHLORIDE 0.9% FLUSH: 10.0000 mL | INTRAVENOUS | Status: DC | PRN

## 2018-02-15 NOTE — Progress Notes (Signed)
[employer] - [place] [facility]      PATIENT NAME: Adam Roberson    MR#:  [id number]  DATE OF BIRTH:  [birth year]  SUBJECTIVE:   No acute events overnight, heart rates are improved today.  Patient's family is at bedside.  Patient remains intubated, plan for weaning off sedation and doing a spontaneous breathing trial today.  REVIEW OF SYSTEMS:    Review of Systems  Unable to perform ROS: Intubated    Nutrition: NPO Tolerating Diet: Tube feeds and yes Tolerating PT: Await Eval.    DRUG ALLERGIES:  No Known Allergies  VITALS:  Blood pressure 90/67, pulse 98, temperature 98.8 F (37.1 C), resp. rate 16, height 5\' 6"  (1.676 m), weight 62 kg, SpO2 96 %.  PHYSICAL EXAMINATION:   Physical Exam  GENERAL:  84 y.o.-year-old patient lying in bed intubated & sedated.   EYES: Pupils equal, round, reactive to light. No scleral icterus.   HEENT: Head atraumatic, normocephalic. ET and OG tubes in place.  NECK:  Supple, no jugular venous distention. No thyroid enlargement, no tenderness.  LUNGS: Normal breath sounds bilaterally, no wheezing, rales, upper airway rhonchi b/l . No use of accessory muscles of respiration.  CARDIOVASCULAR: S1, S2, RRR. No murmurs, rubs, or gallops.  ABDOMEN: Soft, nontender, nondistended. Bowel sounds present. No organomegaly or mass.  EXTREMITIES: No cyanosis, clubbing or edema b/l.    NEUROLOGIC: SEdated & Intubated.  PSYCHIATRIC: Sedated & Intubated.  SKIN: No obvious rash, lesion, or ulcer.    LABORATORY PANEL:   CBC Recent Labs  Lab 02/14/18 0712  WBC 9.2  HGB 12.9*  HCT 39.8  PLT 160   ------------------------------------------------------------------------------------------------------------------  Chemistries  Recent Labs  Lab 02/15/18 0349 02/15/18 0352 02/15/18 1253  NA  --  140  --   K  --  3.1* 4.3  CL  --  107  --   CO2  --  26  --   GLUCOSE  --  141*  --   BUN  --  16  --   CREATININE  --  1.09   --   CALCIUM  --  7.0*  --   MG 2.0  --   --   AST  --  99*  --   ALT  --  28  --   ALKPHOS  --  59  --   BILITOT  --  0.4  --    ------------------------------------------------------------------------------------------------------------------  Cardiac Enzymes Recent Labs  Lab 02/14/18 1802  TROPONINI 22.15*   ------------------------------------------------------------------------------------------------------------------  RADIOLOGY:  Dg Chest Port 1 View  Result Date: 02/15/2018 CLINICAL DATA:  PICC Line placement EXAM: PORTABLE CHEST 1 VIEW COMPARISON:  02/15/2018 FINDINGS: Interval placement of RIGHT-sided PICC line, tip overlying the superior vena cava. Endotracheal tube is in place with tip approximately 1 centimeter above the carina. A nasogastric tube is in place, tip off the image beyond the gastroesophageal junction. The heart is enlarged and stable in configuration. There are patchy infiltrates in the lungs bilaterally. Bilateral pleural effusions are stable. IMPRESSION: Interval placement of RIGHT-sided PICC line, tip to the superior vena cava. Stable bilateral pleural effusions and parenchymal opacities. Electronically Signed   By: Norva PavlovElizabeth  Brown M.D.   On: 02/15/2018 09:40   Dg Chest Port 1 View  Result Date: 02/15/2018 CLINICAL DATA:  Acute respiratory failure EXAM: PORTABLE CHEST 1 VIEW COMPARISON:  2017-11-03 FINDINGS: Cardiac shadow is stable. Endotracheal tube is noted at the level of the carina. This could be withdrawn 2-3  cm. Nasogastric catheter extends into the stomach. Vascular congestion and increasing pleural effusions are seen. Bibasilar atelectasis/infiltrate is noted as well. IMPRESSION: Endotracheal tube at the level of the carina. This could be withdrawn 2-3 cm. Persistent changes of CHF with increasing effusions bilaterally. Bibasilar opacities consistent with atelectasis or infiltrate. Electronically Signed   By: Alcide Clever M.D.   On: 02/15/2018 07:26    [place] Ekg Site Rite  Result Date: 02/14/2018 If Site Rite image not attached, placement could not be confirmed due to current cardiac rhythm.    ASSESSMENT AND PLAN:   84 year old male with past medical history of consents disease, atrial fibrillation, hypertension, hyperlipidemia, history of previous CVA, previous history of DVT, chronic diastolic CHF who presented to the hospital due to shortness of breath and chest pain.  1.  Acute respiratory failure with hypoxia- secondary to a combination of mild CHF, pneumonia -Patient is currently intubated and sedated.  Continue vent support as per intensivist. -Continue broad-spectrum IV antibiotics with Cefepime for now. - cont. Diuresis with IV lasix but remains net + overall.   2.  Non-ST elevation MI-patient presented to the hospital complaining of vague chest pain for the past 2 days prior to admission.  he has ruled in by cardiac markers. - Seen by cardiology and echocardiogram reviewed showing stress-induced cardiomyopathy or possibly LAD disease. - Seen by cardiology, continue heparin drip for now. - cont. Low dose Metoprolol.    3.  Atrial fibrillation with rapid ventricle response- rates have improved since yesterday.  Continue IV metoprolol pushes and oral metoprolol for now. - cont. Heparin gtt.   - appreciate Cardiology input.   4.  History of previous DVT- continue heparin drip for now.  5.  History of Parkinson's disease-we will resume Sinemet once patient can take p.o.  6.  Essential hypertension- cont. Low dose Metoprolol.   7.  Acute on chronic systolic CHF- cont. IV diuresis with low-dose Lasix and pt. Remains net + about 3L or so.  - EchoCardiogram done showing LV dysfunction with EF of 35 to 40%. -Continue low-dose metoprolol.  8. Hypokalemia - improved w/ supplementation and will cont. To monitor.    All the records are reviewed and case discussed with Care Management/Social Worker. Management plans discussed  with the patient, family and they are in agreement.  CODE STATUS: Full code  DVT Prophylaxis: Heparin gtt  TOTAL TIME TAKING CARE OF THIS PATIENT: 30 minutes.   POSSIBLE D/C unclear , DEPENDING ON CLINICAL CONDITION and progress.    Houston Siren M.D on 02/15/2018 at 3:48 PM  Between 7am to 6pm - Pager - [phone]  After 6pm go to [email or web address] - Therapist, [facility] Hospital[facility]  Office  [phone]  CC: Primary care physician; Raynelle Bring

## 2018-02-15 NOTE — Progress Notes (Addendum)
Follow up - Critical Care Medicine Note  Patient Details:    Adam Roberson is an 84 y.o. male.  With a past medical history remarkable for coronary artery disease, hypertension, atrial fibrillation, diastolic heart failure, presented to the emergency department via EMS secondary to aggressive respiratory failure with hypoxemia.  Patient was subsequently intubated and admitted to the intensive care unit.  Was treated for respiratory failure and pneumonia.  Also found to have NSTEMI  Lines, Airways, Drains: Airway (Active)     Airway 8 mm (Active)  Secured at (cm) 22 cm 02/14/2018  8:47 AM  Measured From Lips 02/14/2018  8:47 AM  Secured Location Center 02/14/2018  8:47 AM  Secured By [employer] Tube Holder 02/14/2018  8:47 AM  Tube Holder Repositioned Yes 02/14/2018  2:38 AM  Cuff Pressure (cm H2O) 27 cm H2O 02/14/2018  8:47 AM  Site Condition Dry 02/14/2018  8:47 AM     NG/OG Tube Orogastric 18 Fr. Center mouth Xray;Aucultation Documented cm marking at nare/ corner of mouth 60 cm (Active)  Cm Marking at Nare/Corner of Mouth (if applicable) 60 cm 02/14/2018  4:00 AM  Site Assessment Clean;Dry;Intact 02/14/2018  4:00 AM  Ongoing Placement Verification No change in cm markings or external length of tube from initial placement 02/14/2018  4:00 AM  Status Suction-low intermittent 02/14/2018  4:00 AM  Amount of suction 80 mmHg 02/14/2018  4:00 AM  Drainage Appearance Green 02/14/2018  4:00 AM     Urethral Catheter April RN Straight-tip 16 Fr. (Active)  Indication for Insertion or Continuance of Catheter Unstable critical patients (first 24-48 hours) 02/14/2018  4:00 AM  Site Assessment Clean;Intact 02/14/2018  4:00 AM  Catheter Maintenance Bag below level of bladder;Catheter secured;Drainage bag/tubing not touching floor;Insertion date on drainage bag;No dependent loops;Seal intact 02/14/2018  8:00 AM  Collection Container Standard drainage bag 02/14/2018  4:00 AM  Securement Method Leg strap 02/14/2018   4:00 AM  Urinary Catheter Interventions Unclamped 02/14/2018  4:00 AM  Output (mL) 100 mL 02/14/2018  5:46 AM    Anti-infectives:  Anti-infectives (From admission, onward)   Start     Dose/Rate Route Frequency Ordered Stop   03/01/2018 1800  ceFEPIme (MAXIPIME) 2 g in sodium chloride 0.9 % 100 mL IVPB     2 g 200 mL/hr over 30 Minutes Intravenous Every 12 hours 03/12/2018 0654     03/01/2018 1600  vancomycin (VANCOCIN) IVPB 1000 mg/200 mL premix  Status:  Discontinued     1,000 mg 200 mL/hr over 60 Minutes Intravenous Every 18 hours 02/14/2018 0831 02/14/18 1031   02/14/2018 0600  oseltamivir (TAMIFLU) capsule 75 mg  Status:  Discontinued     75 mg Oral  Once 03/02/2018 0555 02/24/2018 0758   02/17/2018 0530  ceFEPIme (MAXIPIME) 2 g in sodium chloride 0.9 % 100 mL IVPB     2 g 200 mL/hr over 30 Minutes Intravenous  Once 02/21/2018 0524 03/14/2018 0636   02/25/2018 0530  metroNIDAZOLE (FLAGYL) IVPB 500 mg  Status:  Discontinued     500 mg 100 mL/hr over 60 Minutes Intravenous Every 8 hours 02/19/2018 0524 02/27/2018 1109   03/13/2018 0530  vancomycin (VANCOCIN) IVPB 1000 mg/200 mL premix     1,000 mg 200 mL/hr over 60 Minutes Intravenous  Once 03/08/2018 0524 02/14/2018 0859      Microbiology: Results for orders placed or performed during the hospital encounter of 02/20/2018  Blood Culture (routine x 2)     Status: None (Preliminary result)  Collection Time: 23-Feb-2018  5:27 AM  Result Value Ref Range Status   Specimen Description BLOOD RIGHT HAND  Final   Special Requests   Final    BOTTLES DRAWN AEROBIC AND ANAEROBIC Blood Culture adequate volume   Culture   Final    NO GROWTH 2 DAYS Performed at [facility], [address] [place], [place] [address]    Report Status PENDING  Incomplete  Blood Culture (routine x 2)     Status: None (Preliminary result)   Collection Time: 02/23/2018  5:27 AM  Result Value Ref Range Status   Specimen Description BLOOD LEFT FOREARM  Final   Special Requests   Final     BOTTLES DRAWN AEROBIC AND ANAEROBIC Blood Culture adequate volume   Culture   Final    NO GROWTH 2 DAYS Performed at [facility], [address], [place], [place] [address]    Report Status PENDING  Incomplete  Urine culture     Status: None   Collection Time: 02-23-2018  5:27 AM  Result Value Ref Range Status   Specimen Description   Final    URINE, RANDOM Performed at [facility], [address] [place], [place] [address]    Special Requests   Final    NONE Performed at [facility], [address] [place], [place] [address]    Culture   Final    NO GROWTH Performed at [facility], [address], [place], [place] [address]    Report Status 02/14/2018 FINAL  Final  MRSA PCR Screening     Status: None   Collection Time: 02/23/2018  8:32 AM  Result Value Ref Range Status   MRSA by PCR NEGATIVE NEGATIVE Final    Comment:        The GeneXpert MRSA Assay (FDA approved for NASAL specimens only), is one component of a comprehensive MRSA colonization surveillance program. It is not intended to diagnose MRSA infection nor to guide or monitor treatment for MRSA infections. Performed at [facility], [address], [place], [place] [address]   Studies: Ct Angio Chest Pe W Or Wo Contrast  Result Date: February 23, 2018 CLINICAL DATA:  Decreased oxygen saturation requiring intubation. EXAM: CT ANGIOGRAPHY CHEST WITH CONTRAST TECHNIQUE: Multidetector CT imaging of the chest was performed using the standard protocol during bolus administration of intravenous contrast. Multiplanar CT image reconstructions and MIPs were obtained to evaluate the vascular anatomy. CONTRAST:  75mL OMNIPAQUE IOHEXOL 350 MG/ML SOLN COMPARISON:  Chest radiograph 02/23/18 FINDINGS: Cardiovascular: Satisfactory opacification of the pulmonary arteries to the segmental level. No evidence of pulmonary embolism. Enlarged heart. No pericardial effusion. Calcific  atherosclerotic disease of the coronary arteries and aorta. Annular calcifications of the mitral valve. Mediastinum/Nodes: Borderline enlarged bilateral mediastinal lymph nodes. The patient is intubated. Main bronchi are patent. Enteric catheter within the esophagus, terminates in the distal gastric body. Lungs/Pleura: Moderate right and small left pleural effusions. Mild interstitial pulmonary edema. Scattered ground-glass opacities throughout both lungs, with more confluent appearance in the lower lobes, left greater than right. Upper Abdomen: Right renal cyst. 18 mm hypoattenuated nodule within the spleen, likely benign. Musculoskeletal: Findings of diffuse idiopathic skeletal hyperostosis of the thoracic spine Review of the MIP images confirms the above findings. IMPRESSION: Enlarged heart with interstitial pulmonary edema and bilateral pleural effusions. Superimposed patchy airspace consolidation in both lungs, more confluent in the lower lobes, suspicious for multifocal pneumonia. Borderline mediastinal lymphadenopathy, likely reactive. Aortic Atherosclerosis (ICD10-I70.0). Electronically Signed  By: Ted Mcalpine M.D.   On: Mar 06, 2018 12:50   Dg Chest Port 1 View  Result Date: 02/15/2018 CLINICAL DATA:  Acute respiratory failure EXAM: PORTABLE CHEST 1 VIEW COMPARISON:  March 06, 2018 FINDINGS: Cardiac shadow is stable. Endotracheal tube is noted at the level of the carina. This could be withdrawn 2-3 cm. Nasogastric catheter extends into the stomach. Vascular congestion and increasing pleural effusions are seen. Bibasilar atelectasis/infiltrate is noted as well. IMPRESSION: Endotracheal tube at the level of the carina. This could be withdrawn 2-3 cm. Persistent changes of CHF with increasing effusions bilaterally. Bibasilar opacities consistent with atelectasis or infiltrate. Electronically Signed   By: Alcide Clever M.D.   On: 02/15/2018 07:26   Dg Chest Port 1 View  Result Date:  2018/03/06 CLINICAL DATA:  Initial evaluation for sepsis, post intubation. EXAM: PORTABLE CHEST 1 VIEW COMPARISON:  Prior radiograph from 08/17/2017 FINDINGS: Endotracheal tube in place with tip position approximately 15 mm above the carina. Enteric tube courses into the abdomen with side hole beyond the GE junction. Median sternotomy wires underlying surgical clips noted, stable. Cardiomegaly unchanged. Mediastinal silhouette normal. Aortic atherosclerosis. Lungs are mildly hypoinflated. Diffuse pulmonary vascular and interstitial congestion, suggesting pulmonary interstitial edema. There are superimposed confluent multifocal opacities at the right mid and lower lung as well as the left lung base, suspicious for infiltrates. Associated small right pleural effusion. No pneumothorax. No acute osseus abnormality. IMPRESSION: 1. Tip of the endotracheal tube approximately 15 mm above the carina. 2. Cardiomegaly with diffuse pulmonary vascular and interstitial congestion, suggesting pulmonary interstitial edema. 3. Superimposed multifocal opacities at the right mid and lower lung and left lung base, suspicious for infiltrates. 4. Small right pleural effusion. Electronically Signed   By: Rise Mu M.D.   On: 03/06/18 05:57   [place] Ekg Site Rite  Result Date: 02/14/2018 If Site Rite image not attached, placement could not be confirmed due to current cardiac rhythm.   Consults: Treatment Team:  Iran Ouch, MD   Subjective:    Overnight Issues: Patient intubated, sedated with atrial fibrillation.  Received 2 doses of Lopressor last evening.  Marginal blood pressure  Objective:  Vital signs for last 24 hours: Temp:  [98.1 F (36.7 C)-100 F (37.8 C)] 99.2 F (37.3 C) (12/03 0400) Pulse Rate:  [36-134] 92 (12/03 0600) Resp:  [10-18] 16 (12/03 0700) BP: (77-119)/(56-90) 84/61 (12/03 0700) SpO2:  [93 %-98 %] 97 % (12/03 0827) FiO2 (%):  [28 %] 28 % (12/03 0827) Weight:  [62 kg] 62 kg  (12/03 0346)  Intake/Output from previous day: 12/02 0701 - 12/03 0700 In: 3662.8 [I.V.:3462.8; IV Piggyback:200] Out: 2450 [Urine:2450]  Intake/Output this shift: No intake/output data recorded.  Vent settings for last 24 hours: Vent Mode: PRVC FiO2 (%):  [28 %] 28 % Set Rate:  [16 bmp] 16 bmp Vt Set:  [400 mL] 400 mL PEEP:  [5 cmH20] 5 cmH20 Plateau Pressure:  [14 cmH20-24 cmH20] 22 cmH20  Physical Exam:  Vital signs: Please see the above listed vital signs HEENT: Patient is orally intubated, trachea midline, no thyromegaly appreciated Cardiovascular: Irregularly iregular rhythm with rapid ventricular response Pulmonary: Coarse rhonchi and rales appreciated diffusely Abdominal: Positive bowel sounds, soft exam Extremities: No clubbing, cyanosis or edema noted Neurologic: Limited exam as patient is on mechanical ventilation  Assessment/Plan:   Hypoxemic respiratory failure.  Multifactorial etiology to include decompensated heart failure, bilateral effusions and interstitial edema noted on CT scan and chest x-ray.  Possible superimposed pneumonia with patchy  airspace disease.  Positive input at 2.1 L.  Diuresis has been limited secondary to marginal blood pressures.  Patient has been empirically on cefepime for possible superimposed pneumonia.  His Chest x-ray shows pulmonary edema and cardiomegaly  begin spontaneous awakening and breathing trials today  NSTEMI.  Troponin  24.64, being followed by cardiology, on aspirin and heparin.  No ischemic changes appreciated on EKG. echocardiogram performed revealed reduced ejection fraction at 35 to 40% with significant hypokinesia consistent with stress-induced cardiomyopathy versus LAD infarction.  Additional medicines have been limited secondary to marginal blood pressure  Atrial fibrillation.  Patient was loaded on digoxin, as needed doses of Lopressor and on systemic  Anticoagulation.  Will follow cardiac recommendations, will try to  avoid Cardizem and amiodarone if possible  Hypokalemia.  Potassium is 3.1.  Will replace  Critical Care Total Time. 40 minutes  Spike Desilets 02/15/2018  *Care during the described time interval was provided by me and/or other providers on the critical care team.  I have reviewed this patient's available data, including medical history, events of note, physical examination and test results as part of my evaluation. Patient ID: Tayton Decaire, male   DOB: [birth year], 84 y.o.   MRN: [id number]

## 2018-02-15 NOTE — Progress Notes (Signed)
ANTICOAGULATION CONSULT NOTE - Initial Consult  Pharmacy Consult for heparin drip Indication: chest pain/ACS  No Known Allergies  Patient Measurements: Height: 5\' 6"  (167.6 cm) Weight: 136 lb 11 oz (62 kg) IBW/kg (Calculated) : 63.8 Heparin Dosing Weight: 66 kg  Vital Signs: Temp: 99.2 F (37.3 C) (12/03 0400) Temp Source: Oral (12/03 0400) BP: 86/56 (12/03 0400) Pulse Rate: 43 (12/03 0400)  Labs: Recent Labs    Feb 25, 2018 0527 02-25-2018 0935 02/25/2018 1255 25-Feb-2018 1453 2018-02-25 1628 25-Feb-2018 1910 February 25, 2018 2021 02-25-2018 2356  02/14/18 0712 02/14/18 1802 02/14/18 1938 02/15/18 0349 02/15/18 0352  HGB 14.9  --   --   --   --   --  13.0 13.1  --  12.9*  --   --   --   --   HCT 45.4  --   --   --   --   --  40.9 41.4  --  39.8  --   --   --   --   PLT 253  --   --   --   --   --   --   --   --  160  --   --   --   --   APTT  --  >160*  --  74*  --  46*  --   --   --   --   --   --   --   --   LABPROT  --  29.2*  --   --   --   --   --   --   --   --   --   --   --   --   INR  --  2.81  --   --   --   --   --   --   --   --   --   --   --   --   HEPARINUNFRC  --   --   --  <0.10*  --   --   --   --    < >  --  0.82* 0.69 0.49  --   CREATININE 0.96  --   --   --   --   --   --   --   --  0.77  --   --   --  1.09  TROPONINI 26.62*  --  19.25*  --  24.64*  --   --   --   --   --  22.15*  --   --   --    < > = values in this interval not displayed.    Estimated Creatinine Clearance: 41.9 mL/min (by C-G formula based on SCr of 1.09 mg/dL).   Medical History: Past Medical History:  Diagnosis Date  . CAD (coronary artery disease)    a.2003 s/p CABG x3 in [place]; b. ? 2014 PCI (records not available).  . Chronic diastolic CHF (congestive heart failure) (HCC)    a. 06/2015 Echo: EF 60-65%, mod dil LA, nl RV, mild to mod TR, PASP .  Marland Kitchen DVT (deep venous thrombosis) (HCC)   . DVT of leg (deep venous thrombosis) (HCC)   . H/O: CVA (cerebrovascular accident)   . HOH (hard of  hearing)   . Hyperlipidemia   . Hypertension   . Hypertensive heart disease   . Parkinson disease (HCC)   . Permanent atrial fibrillation    a. CHA2DS2VASc= 7-->coumadin (followed by  primary care).  . Pneumonia    4/17    Medications:  Warfarin as outpatient  Assessment: Trop  26.62   Goal of Therapy:  Heparin level 0.3-0.7 units/ml Monitor platelets by anticoagulation protocol: Yes   Plan:  12/03 @ 0400 HL 0.49 therapeutic. Will continue rate at 950 units/hr and will recheck HL w/ am labs. CBC has been trending down, trops also trending down will continue to monitor.  Thomasene Rippleavid Khyree Carillo, PharmD, BCPS Clinical Pharmacist 02/15/2018

## 2018-02-15 NOTE — Consult Note (Signed)
Pharmacy Antibiotic Note  Adam Roberson is a 84 y.o. male admitted on 02/23/2018 with sepsis secondary to respiratory failure. Patient presented to the emergency departed through EMS. Patient admitted to the ICU and is requiring mechanical ventilation. Pharmacy has been consulted for Cefepime dosing.  Plan: Continue Cefepime 2g IV every 12 hours.   Height: 5\' 6"  (167.6 cm) Weight: 136 lb 11 oz (62 kg) IBW/kg (Calculated) : 63.8  Temp (24hrs), Avg:99.3 F (37.4 C), Min:98.8 F (37.1 C), Max:100 F (37.8 C)  Recent Labs  Lab 2018-01-17 0527 2018-01-17 0543 2018-01-17 0935 2018-01-17 1255 2018-01-17 1628 02/14/18 0712 02/15/18 0352  WBC 12.5*  --   --   --   --  9.2  --   CREATININE 0.96  --   --   --   --  0.77 1.09  LATICACIDVEN  --  2.85* 2.6* 1.8 1.3  --   --     Estimated Creatinine Clearance: 41.9 mL/min (by C-G formula based on SCr of 1.09 mg/dL).    No Known Allergies  Antimicrobials this admission: Metronidazole 12/1 x 1 Vancomycin 12/1 >> 12/2 Cefepime 12/1 >>   Dose adjustments this admission:  Microbiology results: 12/1 BCx: no growth x 2 12/1 UCx: no growth  12/1  MRSA PCR: negative   Thank you for allowing pharmacy to be a part of this patient's care.  Williamson Cavanah L, 02/15/2018 5:39 PM

## 2018-02-15 NOTE — Progress Notes (Signed)
Progress Note  Patient Name: Adam Roberson [facility] Date of Encounter: 02/15/2018  Primary Cardiologist: [place] intubated and sedated in Afib with RVR with improved ventricular rates in the low 100s to 110s bpm. He did receive 2 doses of IV Lopressor 2.5 mg overnight. Potassium low at 3.1 this morning, currently being repleted. BP soft in the 12Y systolic. Remains net + for the admission. No family present at this time.   Inpatient Medications    Scheduled Meds: . aspirin  324 mg Per Tube Daily  . chlorhexidine gluconate (MEDLINE KIT)  15 mL Mouth Rinse BID  . docusate sodium  100 mg Oral BID  . furosemide  20 mg Intravenous BID  . mouth rinse  15 mL Mouth Rinse 10 times per day  . metoprolol tartrate  25 mg Oral BID  . multivitamin  15 mL Per Tube Daily  . pantoprazole sodium  40 mg Per Tube Daily  . potassium chloride  30 mEq Per Tube Q4H   Continuous Infusions: . sodium chloride 125 mL/hr at 02/15/18 0600  . ceFEPime (MAXIPIME) IV Stopped (02/15/18 0545)  . feeding supplement (VITAL HIGH PROTEIN) 1,000 mL (02/14/18 1353)  . fentaNYL infusion INTRAVENOUS 125 mcg/hr (02/15/18 0600)  . heparin 950 Units/hr (02/15/18 0600)  . propofol (DIPRIVAN) infusion 20 mcg/kg/min (02/15/18 0600)   PRN Meds: acetaminophen **OR** acetaminophen, metoprolol tartrate, ondansetron **OR** ondansetron (ZOFRAN) IV   Vital Signs    Vitals:   02/15/18 0400 02/15/18 0500 02/15/18 0600 02/15/18 0700  BP: (!) 86/56 (!) 77/57 (!) 87/64 (!) 84/61  Pulse: (!) 43 92 92   Resp: _0 Temp: 99.2 F (37.3 C)     TempSrc: Oral     SpO2: 97% 97% 98%   Weight:      Height:        Intake/Output Summary (Last 24 hours) at 02/15/2018 0817 Last data filed at 02/15/2018 0600 Gross per 24 hour  Intake 3455.35 ml  Output 2450 ml  Net 1005.35 ml   Filed Weights   02/24/2018 0829 02/14/18 0500 02/15/18 0346  Weight: 62 kg 62.8 kg 62 kg    Telemetry    Afib with RVR with  ventricular rates improved to the low 100s to 110s bpm - Personally Reviewed  ECG    n/a - Personally Reviewed  Physical Exam   GEN: No acute distress. Intubated and sedated.   Neck: No JVD. Cardiac: RRR, no murmurs, rubs, or gallops.  Respiratory: Diminished breath sounds bilaterally, intubated.  GI: Soft, nontender, non-distended.   MS: No edema; No deformity. Neuro:  Intubated and sedated.  Psych: Intubated and sedated.  Labs    Chemistry Recent Labs  Lab 02/18/2018 0527 02/14/18 0712 02/14/18 1710 02/15/18 0352  NA 138 139  --  140  K 3.3* 5.0 4.2 3.1*  CL 100 112*  --  107  CO2 27 21*  --  26  GLUCOSE 172* 107*  --  141*  BUN 20 13  --  16  CREATININE 0.96 0.77  --  1.09  CALCIUM 8.7* 7.4*  --  7.0*  PROT 7.7 5.3*  --  5.5*  ALBUMIN 3.6 2.4*  --  2.4*  AST 102* 69*  --  99*  ALT 17 25  --  28  ALKPHOS 86 56  --  59  BILITOT 0.6 1.0  --  0.4  GFRNONAA >60 >60  --  >60  GFRAA >60 >60  --  >  [place] 6  --  7     Hematology Recent Labs  Lab 03/09/2018 0527 02/15/2018 2021 02/17/2018 2356 02/14/18 0712  WBC 12.5*  --   --  9.2  RBC 5.06  --   --  4.35  HGB 14.9 13.0 13.1 12.9*  HCT 45.4 40.9 41.4 39.8  MCV 89.7  --   --  91.5  MCH 29.4  --   --  29.7  MCHC 32.8  --   --  32.4  RDW 13.1  --   --  13.3  PLT 253  --   --  160    Cardiac Enzymes Recent Labs  Lab 02/16/2018 0527 03/14/2018 1255 02/27/2018 1628 02/14/18 1802  TROPONINI 26.62* 19.25* 24.64* 22.15*   No results for input(s): TROPIPOC in the last 168 hours.   BNPNo results for input(s): BNP, PROBNP in the last 168 hours.   DDimer No results for input(s): DDIMER in the last 168 hours.   Radiology    Ct Angio Chest Pe W Or Wo Contrast  Result Date: 03/07/2018 IMPRESSION: Enlarged heart with interstitial pulmonary edema and bilateral pleural effusions. Superimposed patchy airspace consolidation in both lungs, more confluent in the lower lobes, suspicious for multifocal pneumonia.  Borderline mediastinal lymphadenopathy, likely reactive. Aortic Atherosclerosis (ICD10-I70.0). Electronically Signed   By: Fidela Salisbury M.D.   On: 03/13/2018 12:50   Dg Chest Port 1 View  Result Date: 02/15/2018 IMPRESSION: Endotracheal tube at the level of the carina. This could be withdrawn 2-3 cm. Persistent changes of CHF with increasing effusions bilaterally. Bibasilar opacities consistent with atelectasis or infiltrate. Electronically Signed   By: Inez Catalina M.D.   On: 02/15/2018 07:26     Cardiac Studies   Echo 03/12/2018: Study Conclusions  - Left ventricle: The cavity size was normal. There was mild concentric hypertrophy. Systolic function was moderately reduced. The estimated ejection fraction was in the range of 35% to 40%. Akinesis of the mid-apicalanteroseptal, anterior, inferior, inferoseptal, and apical myocardium. - Aortic valve: There was mild regurgitation. - Mitral valve: Calcified annulus. There was moderate regurgitation. - Left atrium: The atrium was mildly dilated. - Tricuspid valve: There was moderate regurgitation. - Pulmonary arteries: Systolic pressure was mildly to moderately increased. PA peak pressure: 44 mm Hg (S).  Impressions:  - Wall motion abnormalities suggestive of stress induced cardiomyopathy vs. LAD infarct.  Patient Profile     84 y.o. male with history of CAD s/p 3-vessel CABG in [place], permanent Afib on Coumadin, HFpEF, CVA, DVT, HTN, HLD dementia, and Parkinson's disease who we are asked to evaluate for elevated troponin in the setting of acute respiratory failure with hypoxia in the setting of suspected ARDS/PNA/acute systolic and acute on chronic diastolic CHF.   Assessment & Plan    1. NSTEMI: -Troponin peaked at 24.64 on 12/1 -Echo with newly reduced LVSF with WMA concerning for stress-induced CM vs LAD infarct -He has completed 48 hours of IV heparin from a NSTEMI standpoint, though will remain on  heparin given his Afib -Urgent cath has been deferred given it appears his symptoms began 2 days prior to presentation -Given the patient's advanced age and comorbid conditions, including underlying dementia, he may not be the best candidate for invasive cardiac procedures at this time. We will need to visit with the family when they arrive (they speak [place] and [place])  2. Acute systolic CHF/acute on chronic diastolic CHF: -Gentle diuresis with IV Lasix 20 mg bid with KCl repletion -  Echo showed a reduced LVSF with an EF of 35-40% with WMA concerning for stress-induced cardiomyopathy vs LAD infarct -Not currently on a beta blocker, ACEi/ARB/spironolactone/Entresto secondary to relative hypotension -Escalate evidence-based heart failure therapy as able -As below, consider changing Propofol to Precedex, defer to PCCM  3. Acute respiratory failure with hypoxia: -Multifactorial including likely PNA and acute systolic CHF -Wean mechanical ventilation as able per PCCM  4. Permanent Afib with RVR: -Ventricular rates remain tachycardic over the past 24 hours, though improving  -He received IV digoxin 0.5 mg x 1 on 12/2 as well as PO Lopressor 25 mg x 1 -Check digoxin level at 12 today -He received 2 doses of IV Lopressor 2.5 mg overnight -Rates are reasonably controlled at this time -Continue prn IV Lopressor -Not a good candidate for calcium channel blockers given his new cardiomyopathy and relative hypotension -Try to avoid amiodarone unless needed for rate control -Remains on heparin gtt in place of Coumadin at this time until it is certain the patient will not require invasive procedures in the near future  -Will need to transition back to [facility] once it is clear no invasive procedures are needed  -In the setting of his hypotension, we are unable to use scheduled PO Lopressor, perhaps his propofol could be changed to Precedex, which would help with his BP and HR  5. HTN: -Has been on the  soft side as above -Precludes beta blocker at this time for rate control  6. Hypoalbuminemia: -Contributing to 3rd spacing  7. Hypokalemia: -Being repleted -Order placed for recheck potassium at 12  For questions or updates, please contact [place] Please consult [email or web address] for contact info under Cardiology/STEMI.    Signed, Christell Faith, PA-C [facility] [place] Pager: [phone] 02/15/2018, 8:17 AM

## 2018-02-15 NOTE — Progress Notes (Signed)
Peripherally Inserted Central Catheter/Midline Placement  The IV Nurse has discussed with the patient and/or persons authorized to consent for the patient, the purpose of this procedure and the potential benefits and risks involved with this procedure.  The benefits include less needle sticks, lab draws from the catheter, and the patient may be discharged home with the catheter. Risks include, but not limited to, infection, bleeding, blood clot (thrombus formation), and puncture of an artery; nerve damage and irregular heartbeat and possibility to perform a PICC exchange if needed/ordered by physician.  Alternatives to this procedure were also discussed.  Bard Power PICC patient education guide, fact sheet on infection prevention and patient information card has been provided to patient /or left at bedside.    PICC/Midline Placement Documentation        Audrie GallusByerly, Loris Seelye Ramos 02/15/2018, 9:28 AM

## 2018-02-16 ENCOUNTER — Inpatient Hospital Stay: Payer: Medicaid Other

## 2018-02-16 DIAGNOSIS — I255 Ischemic cardiomyopathy: Secondary | ICD-10-CM

## 2018-02-16 LAB — BASIC METABOLIC PANEL
Anion gap: 4 — ABNORMAL LOW (ref 5–15)
BUN: 27 mg/dL — ABNORMAL HIGH (ref 8–23)
CO2: 28 mmol/L (ref 22–32)
Calcium: 7.7 mg/dL — ABNORMAL LOW (ref 8.9–10.3)
Chloride: 111 mmol/L (ref 98–111)
Creatinine, Ser: 1 mg/dL (ref 0.61–1.24)
GFR calc non Af Amer: >60 mL/min (ref >60)
Glucose, Bld: 201 mg/dL — ABNORMAL HIGH (ref 70–99)
POTASSIUM: 3.8 mmol/L (ref 3.5–5.1)
Sodium: 143 mmol/L (ref 135–145)

## 2018-02-16 LAB — GLUCOSE, CAPILLARY
Glucose-Capillary: 180 mg/dL — ABNORMAL HIGH (ref 70–99)
Glucose-Capillary: 152 mg/dL — ABNORMAL HIGH (ref 70–99)
Glucose-Capillary: 116 mg/dL — ABNORMAL HIGH (ref 70–99)
Glucose-Capillary: 152 mg/dL — ABNORMAL HIGH (ref 70–99)
Glucose-Capillary: 137 mg/dL — ABNORMAL HIGH (ref 70–99)
Glucose-Capillary: 130 mg/dL — ABNORMAL HIGH (ref 70–99)

## 2018-02-16 LAB — CBC
HCT: 38.6 % — ABNORMAL LOW (ref 39.0–52.0)
Hemoglobin: 12.1 g/dL — ABNORMAL LOW (ref 13.0–17.0)
MCH: 29.3 pg (ref 26.0–34.0)
MCHC: 31.3 g/dL (ref 30.0–36.0)
MCV: 93.5 fL (ref 80.0–100.0)
Platelets: 141 10*3/uL — ABNORMAL LOW (ref 150–400)
RBC: 4.13 MIL/uL — AB (ref 4.22–5.81)
RDW: 13.5 % (ref 11.5–15.5)
WBC: 9.8 10*3/uL (ref 4.0–10.5)
nRBC: 0 % (ref 0.0–0.2)

## 2018-02-16 LAB — HEPARIN LEVEL (UNFRACTIONATED): Heparin Unfractionated: 0.54 IU/mL (ref 0.30–0.70)

## 2018-02-16 LAB — TROPONIN I: Troponin I: 17.5 ng/mL (ref <0.03)

## 2018-02-16 MED ORDER — NOREPINEPHRINE 16 MG/250ML-% IV SOLN
0.0000 ug/min | INTRAVENOUS | Status: DC
  Filled 2018-02-16: qty 250

## 2018-02-16 NOTE — Progress Notes (Signed)
ET tube was moved back from 22 cm to 24 cm at the lip per provider order.

## 2018-02-16 NOTE — Progress Notes (Signed)
ANTICOAGULATION CONSULT NOTE - Initial Consult  Pharmacy Consult for heparin drip Indication: chest pain/ACS  No Known Allergies  Patient Measurements: Height: 5\' 6"  (167.6 cm) Weight: 136 lb 11 oz (62 kg) IBW/kg (Calculated) : 63.8 Heparin Dosing Weight: 66 kg  Vital Signs: Temp: 96.8 F (36 C) (12/04 0400) Temp Source: Axillary (12/04 0400) BP: 82/58 (12/04 0500) Pulse Rate: 66 (12/04 0500)  Labs: Recent Labs    02/28/2018 0527 03/06/2018 0935 03/07/2018 1255 03/01/2018 1453 03/12/2018 1628 03/05/2018 1910  03/10/2018 2356  02/14/18 0712 02/14/18 1802 02/14/18 1938 02/15/18 0349 02/15/18 0352 02/16/18 0410  HGB 14.9  --   --   --   --   --    < > 13.1  --  12.9*  --   --   --   --  12.1*  HCT 45.4  --   --   --   --   --    < > 41.4  --  39.8  --   --   --   --  38.6*  PLT 253  --   --   --   --   --   --   --   --  160  --   --   --   --  141*  APTT  --  >160*  --  74*  --  46*  --   --   --   --   --   --   --   --   --   LABPROT  --  29.2*  --   --   --   --   --   --   --   --   --   --   --   --   --   INR  --  2.81  --   --   --   --   --   --   --   --   --   --   --   --   --   HEPARINUNFRC  --   --   --  <0.10*  --   --   --   --    < >  --  0.82* 0.69 0.49  --  0.54  CREATININE 0.96  --   --   --   --   --   --   --   --  0.77  --   --   --  1.09 1.00  TROPONINI 26.62*  --  19.25*  --  24.64*  --   --   --   --   --  22.15*  --   --   --   --    < > = values in this interval not displayed.    Estimated Creatinine Clearance: 45.6 mL/min (by C-G formula based on SCr of 1 mg/dL).   Medical History: Past Medical History:  Diagnosis Date  . CAD (coronary artery disease)    a.2003 s/p CABG x3 in [place]; b. ? 2014 PCI (records not available).  . Chronic diastolic CHF (congestive heart failure) (HCC)    a. 06/2015 Echo: EF 60-65%, mod dil LA, nl RV, mild to mod TR, PASP 46mmHg.  Marland Kitchen. DVT (deep venous thrombosis) (HCC)   . DVT of leg (deep venous thrombosis) (HCC)   . H/O:  CVA (cerebrovascular accident)   . HOH (hard of hearing)   . Hyperlipidemia   . Hypertension   .  Hypertensive heart disease   . Parkinson disease (HCC)   . Permanent atrial fibrillation    a. CHA2DS2VASc= 7-->coumadin (followed by primary care).  . Pneumonia    4/17    Medications:  Warfarin as outpatient  Assessment: Trop  26.62   Goal of Therapy:  Heparin level 0.3-0.7 units/ml Monitor platelets by anticoagulation protocol: Yes   Plan:  12/04 @ 0400 HL 0.54 therapeutic. Will continue rate at 950 units/hr and will recheck HL w/ am labs. CBC has been trending down, trops also trending down will continue to monitor.  Thomasene Ripple, PharmD, BCPS Clinical Pharmacist 02/16/2018

## 2018-02-16 NOTE — Progress Notes (Signed)
[employer] - [place] [facility]      PATIENT NAME: Delight HohMohammad Birkeland    MR#:  [id number]  DATE OF BIRTH:  [birth year]  SUBJECTIVE:   The patient is a still on ventilation, unable to wean off sedation and ventilation. REVIEW OF SYSTEMS:    Review of Systems  Unable to perform ROS: Intubated    Nutrition: NPO Tolerating Diet: Tube feeds and yes Tolerating PT: Await Eval.    DRUG ALLERGIES:  No Known Allergies  VITALS:  Blood pressure 97/60, pulse 74, temperature 98.1 F (36.7 C), temperature source Oral, resp. rate 18, height 5\' 6"  (1.676 m), weight 62.4 kg, SpO2 97 %.  PHYSICAL EXAMINATION:   Physical Exam  GENERAL:  84 y.o.-year-old patient lying in bed intubated & sedated.   EYES: Pupils equal, round, reactive to light. No scleral icterus.   HEENT: Head atraumatic, normocephalic. ET and OG tubes in place.  NECK:  Supple, no jugular venous distention. No thyroid enlargement, no tenderness.  LUNGS: Normal breath sounds bilaterally, no wheezing, rales, upper airway rhonchi b/l . No use of accessory muscles of respiration.  CARDIOVASCULAR: Irregular rate and rhythm, tachycardia. No murmurs, rubs, or gallops.  ABDOMEN: Soft, nontender, nondistended. Bowel sounds present. No organomegaly or mass.  EXTREMITIES: No cyanosis, clubbing or edema b/l.    NEUROLOGIC: SEdated & Intubated.  PSYCHIATRIC: Sedated & Intubated.  SKIN: No obvious rash, lesion, or ulcer.    LABORATORY PANEL:   CBC Recent Labs  Lab 02/16/18 0410  WBC 9.8  HGB 12.1*  HCT 38.6*  PLT 141*   ------------------------------------------------------------------------------------------------------------------  Chemistries  Recent Labs  Lab 02/15/18 0349 02/15/18 0352  02/16/18 0410  NA  --  140  --  143  K  --  3.1*   < > 3.8  CL  --  107  --  111  CO2  --  26  --  28  GLUCOSE  --  141*  --  201*  BUN  --  16  --  27*  CREATININE  --  1.09  --  1.00  CALCIUM  --  7.0*  --   7.7*  MG 2.0  --   --   --   AST  --  99*  --   --   ALT  --  28  --   --   ALKPHOS  --  59  --   --   BILITOT  --  0.4  --   --    < > = values in this interval not displayed.   ------------------------------------------------------------------------------------------------------------------  Cardiac Enzymes Recent Labs  Lab 02/16/18 0410  TROPONINI 17.50*   ------------------------------------------------------------------------------------------------------------------  RADIOLOGY:  Dg Chest Port 1 View  Result Date: 02/16/2018 CLINICAL DATA:  Respiratory distress.  Patient on ventilation. EXAM: PORTABLE CHEST 1 VIEW COMPARISON:  February 15, 2018 FINDINGS: The ETT terminates slightly above the carina, 5 mm. An NG tube terminates below today's film. A right PICC line terminates in the SVC. Diffuse interstitial opacities. Bilateral effusions. More focal infiltrate in the left base. Stable cardiomediastinal silhouette. IMPRESSION: 1. The ETT terminates slightly above the carina, 5 mm. Recommend withdrawing 2 cm. 2. Other support apparatus as above. 3. Diffuse interstitial opacities and bilateral effusions. 4. More focal infiltrate in the left base. These results will be called to the ordering clinician or representative by the Radiologist Assistant, and communication documented in the PACS or zVision Dashboard. Electronically Signed   By: Gerome Samavid  Williams III M.D  On: 02/16/2018 14:22   Dg Chest Port 1 View  Result Date: 02/15/2018 CLINICAL DATA:  PICC Line placement EXAM: PORTABLE CHEST 1 VIEW COMPARISON:  02/15/2018 FINDINGS: Interval placement of RIGHT-sided PICC line, tip overlying the superior vena cava. Endotracheal tube is in place with tip approximately 1 centimeter above the carina. A nasogastric tube is in place, tip off the image beyond the gastroesophageal junction. The heart is enlarged and stable in configuration. There are patchy infiltrates in the lungs bilaterally. Bilateral  pleural effusions are stable. IMPRESSION: Interval placement of RIGHT-sided PICC line, tip to the superior vena cava. Stable bilateral pleural effusions and parenchymal opacities. Electronically Signed   By: Norva Pavlov M.D.   On: 02/15/2018 09:40   Dg Chest Port 1 View  Result Date: 02/15/2018 CLINICAL DATA:  Acute respiratory failure EXAM: PORTABLE CHEST 1 VIEW COMPARISON:  03/14/2018 FINDINGS: Cardiac shadow is stable. Endotracheal tube is noted at the level of the carina. This could be withdrawn 2-3 cm. Nasogastric catheter extends into the stomach. Vascular congestion and increasing pleural effusions are seen. Bibasilar atelectasis/infiltrate is noted as well. IMPRESSION: Endotracheal tube at the level of the carina. This could be withdrawn 2-3 cm. Persistent changes of CHF with increasing effusions bilaterally. Bibasilar opacities consistent with atelectasis or infiltrate. Electronically Signed   By: Alcide Clever M.D.   On: 02/15/2018 07:26   [place] Ekg Site Rite  Result Date: 02/14/2018 If Site Rite image not attached, placement could not be confirmed due to current cardiac rhythm.    ASSESSMENT AND PLAN:   84 year old male with past medical history of consents disease, atrial fibrillation, hypertension, hyperlipidemia, history of previous CVA, previous history of DVT, chronic diastolic CHF who presented to the hospital due to shortness of breath and chest pain.  1.  Acute respiratory failure with hypoxia- secondary to a combination of mild CHF, pneumonia -Patient is currently intubated and sedated.  Continue vent support as per intensivist. -Continue broad-spectrum IV antibiotics with Cefepime for now. - cont. Diuresis with IV lasix but remains net + overall.   2.  Non-ST elevation MI-patient presented to the hospital complaining of vague chest pain for the past 2 days prior to admission.  he has ruled in by cardiac markers. - Seen by cardiology and echocardiogram reviewed showing  stress-induced cardiomyopathy or possibly LAD disease. - Seen by cardiology, continue heparin drip for now. - cont. Low dose Metoprolol.    3.  Atrial fibrillation with rapid ventricle response-HR was up to 150s due to spontaneous awakening and breathing trials.  continue IV metoprolol pushes and oral metoprolol for now. Loaded on digoxin. - cont. Heparin gtt.   - appreciate Cardiology input.   4.  History of previous DVT- continue heparin drip for now.  5.  History of Parkinson's disease-we will resume Sinemet once patient can take p.o.  6.  Essential hypertension- cont. Low dose Metoprolol.   7.  Acute on chronic systolic CHF, EF of 35-40%- cont. IV diuresis with low-dose Lasix and pt. Remains net + about 3L or so.  - EchoCardiogram done showing LV dysfunction with EF of 35 to 40%. -Continue low-dose metoprolol.  8. Hypokalemia - improved w/ supplementation and will cont. To monitor.    All the records are reviewed and case discussed with Care Management/Social Worker. Management plans discussed with the patient, family and they are in agreement.  CODE STATUS: Full code  DVT Prophylaxis: Heparin gtt  TOTAL TIME TAKING CARE OF THIS PATIENT: 32 minutes.  POSSIBLE D/C unclear , DEPENDING ON CLINICAL CONDITION and progress.    Shaune Pollack M.D on 02/16/2018 at 6:02 PM  Between 7am to 6pm - Pager - [phone]  After 6pm go to [email or web address] - Therapist, nutritional Hospitalists  Office  [phone]  CC: Primary care physician; Raynelle Bring

## 2018-02-16 NOTE — Consult Note (Signed)
Pharmacy Antibiotic Note  Adam Roberson is a 84 y.o. male admitted on 02/21/2018 with sepsis secondary to respiratory failure. Patient presented to the emergency departed through EMS. Patient admitted to the ICU and is requiring mechanical ventilation. Pharmacy has been consulted for Cefepime dosing.  Plan: Continue Cefepime 2g IV every 12 hours.   Height: 5\' 6"  (167.6 cm) Weight: 137 lb 9.1 oz (62.4 kg) IBW/kg (Calculated) : 63.8  Temp (24hrs), Avg:98.3 F (36.8 C), Min:96.8 F (36 C), Max:98.9 F (37.2 C)  Recent Labs  Lab 03/03/2018 0527 03/15/2018 0543 03/10/2018 0935 02/23/2018 1255 02/20/2018 1628 02/14/18 0712 02/15/18 0352 02/16/18 0410  WBC 12.5*  --   --   --   --  9.2  --  9.8  CREATININE 0.96  --   --   --   --  0.77 1.09 1.00  LATICACIDVEN  --  2.85* 2.6* 1.8 1.3  --   --   --     Estimated Creatinine Clearance: 45.9 mL/min (by C-G formula based on SCr of 1 mg/dL).    No Known Allergies  Antimicrobials this admission: Metronidazole 12/1 x 1 Vancomycin 12/1 >> 12/2 Cefepime 12/1 >>   Dose adjustments this admission:  Microbiology results: 12/1 BCx: no growth x 2 12/1 UCx: no growth  12/1  MRSA PCR: negative   Thank you for allowing pharmacy to be a part of this patient's care.  Clovia CuffLisa Azharia Surratt, PharmD, BCPS 02/16/2018 3:48 PM

## 2018-02-16 NOTE — Progress Notes (Signed)
Report given to Asher MuirJamie, RN who is now taking over patient's care. Off Fentanyl drip since 0940 and precedex off since 1330.  Patient has not woken up to follow commands and no display of pain.  Withdraws from pain.

## 2018-02-16 NOTE — Progress Notes (Signed)
Follow up - Critical Care Medicine Note  Patient Details:    Adam Roberson is an 84 y.o. male.  With a past medical history remarkable for coronary artery disease, hypertension, atrial fibrillation, diastolic heart failure, presented to the emergency department via EMS secondary to aggressive respiratory failure with hypoxemia.  Patient was subsequently intubated and admitted to the intensive care unit.  Was treated for respiratory failure and pneumonia.  Also found to have NSTEMI  Lines, Airways, Drains: Airway (Active)     Airway 8 mm (Active)  Secured at (cm) 22 cm 02/14/2018  8:47 AM  Measured From Lips 02/14/2018  8:47 AM  Secured Location Center 02/14/2018  8:47 AM  Secured By [employer] Tube Holder 02/14/2018  8:47 AM  Tube Holder Repositioned Yes 02/14/2018  2:38 AM  Cuff Pressure (cm H2O) 27 cm H2O 02/14/2018  8:47 AM  Site Condition Dry 02/14/2018  8:47 AM     NG/OG Tube Orogastric 18 Fr. Center mouth Xray;Aucultation Documented cm marking at nare/ corner of mouth 60 cm (Active)  Cm Marking at Nare/Corner of Mouth (if applicable) 60 cm 02/14/2018  4:00 AM  Site Assessment Clean;Dry;Intact 02/14/2018  4:00 AM  Ongoing Placement Verification No change in cm markings or external length of tube from initial placement 02/14/2018  4:00 AM  Status Suction-low intermittent 02/14/2018  4:00 AM  Amount of suction 80 mmHg 02/14/2018  4:00 AM  Drainage Appearance Green 02/14/2018  4:00 AM     Urethral Catheter April RN Straight-tip 16 Fr. (Active)  Indication for Insertion or Continuance of Catheter Unstable critical patients (first 24-48 hours) 02/14/2018  4:00 AM  Site Assessment Clean;Intact 02/14/2018  4:00 AM  Catheter Maintenance Bag below level of bladder;Catheter secured;Drainage bag/tubing not touching floor;Insertion date on drainage bag;No dependent loops;Seal intact 02/14/2018  8:00 AM  Collection Container Standard drainage bag 02/14/2018  4:00 AM  Securement Method Leg strap 02/14/2018   4:00 AM  Urinary Catheter Interventions Unclamped 02/14/2018  4:00 AM  Output (mL) 100 mL 02/14/2018  5:46 AM    Anti-infectives:  Anti-infectives (From admission, onward)   Start     Dose/Rate Route Frequency Ordered Stop   03/01/2018 1800  ceFEPIme (MAXIPIME) 2 g in sodium chloride 0.9 % 100 mL IVPB     2 g 200 mL/hr over 30 Minutes Intravenous Every 12 hours 03/12/2018 0654     03/01/2018 1600  vancomycin (VANCOCIN) IVPB 1000 mg/200 mL premix  Status:  Discontinued     1,000 mg 200 mL/hr over 60 Minutes Intravenous Every 18 hours 02/14/2018 0831 02/14/18 1031   02/14/2018 0600  oseltamivir (TAMIFLU) capsule 75 mg  Status:  Discontinued     75 mg Oral  Once 03/02/2018 0555 02/24/2018 0758   02/17/2018 0530  ceFEPIme (MAXIPIME) 2 g in sodium chloride 0.9 % 100 mL IVPB     2 g 200 mL/hr over 30 Minutes Intravenous  Once 02/21/2018 0524 03/14/2018 0636   02/25/2018 0530  metroNIDAZOLE (FLAGYL) IVPB 500 mg  Status:  Discontinued     500 mg 100 mL/hr over 60 Minutes Intravenous Every 8 hours 02/19/2018 0524 02/27/2018 1109   03/13/2018 0530  vancomycin (VANCOCIN) IVPB 1000 mg/200 mL premix     1,000 mg 200 mL/hr over 60 Minutes Intravenous  Once 03/08/2018 0524 02/14/2018 0859      Microbiology: Results for orders placed or performed during the hospital encounter of 02/20/2018  Blood Culture (routine x 2)     Status: None (Preliminary result)  Collection Time: 02/25/2018  5:27 AM  Result Value Ref Range Status   Specimen Description BLOOD RIGHT HAND  Final   Special Requests   Final    BOTTLES DRAWN AEROBIC AND ANAEROBIC Blood Culture adequate volume   Culture   Final    NO GROWTH 3 DAYS Performed at [facility], [address], [place], [place] [address]    Report Status PENDING  Incomplete  Blood Culture (routine x 2)     Status: None (Preliminary result)   Collection Time: 03/02/2018  5:27 AM  Result Value Ref Range Status   Specimen Description BLOOD LEFT FOREARM  Final   Special Requests   Final     BOTTLES DRAWN AEROBIC AND ANAEROBIC Blood Culture adequate volume   Culture   Final    NO GROWTH 3 DAYS Performed at [facility], [address] [place], [place] [address]    Report Status PENDING  Incomplete  Urine culture     Status: None   Collection Time: 03/12/2018  5:27 AM  Result Value Ref Range Status   Specimen Description   Final    URINE, RANDOM Performed at [facility], [address] [place], [place] [address]    Special Requests   Final    NONE Performed at [facility], [address] [place], [place] [address]    Culture   Final    NO GROWTH Performed at [facility], [address] [place], [place] [address]    Report Status 02/14/2018 FINAL  Final  MRSA PCR Screening     Status: None   Collection Time: 03/02/2018  8:32 AM  Result Value Ref Range Status   MRSA by PCR NEGATIVE NEGATIVE Final    Comment:        The GeneXpert MRSA Assay (FDA approved for NASAL specimens only), is one component of a comprehensive MRSA colonization surveillance program. It is not intended to diagnose MRSA infection nor to guide or monitor treatment for MRSA infections. Performed at [facility], [address], [place], [place] [address]   Studies: Ct Angio Chest Pe W Or Wo Contrast  Result Date: 02/18/2018 CLINICAL DATA:  Decreased oxygen saturation requiring intubation. EXAM: CT ANGIOGRAPHY CHEST WITH CONTRAST TECHNIQUE: Multidetector CT imaging of the chest was performed using the standard protocol during bolus administration of intravenous contrast. Multiplanar CT image reconstructions and MIPs were obtained to evaluate the vascular anatomy. CONTRAST:  75mL OMNIPAQUE IOHEXOL 350 MG/ML SOLN COMPARISON:  Chest radiograph 03/03/2018 FINDINGS: Cardiovascular: Satisfactory opacification of the pulmonary arteries to the segmental level. No evidence of pulmonary embolism. Enlarged heart. No pericardial effusion. Calcific  atherosclerotic disease of the coronary arteries and aorta. Annular calcifications of the mitral valve. Mediastinum/Nodes: Borderline enlarged bilateral mediastinal lymph nodes. The patient is intubated. Main bronchi are patent. Enteric catheter within the esophagus, terminates in the distal gastric body. Lungs/Pleura: Moderate right and small left pleural effusions. Mild interstitial pulmonary edema. Scattered ground-glass opacities throughout both lungs, with more confluent appearance in the lower lobes, left greater than right. Upper Abdomen: Right renal cyst. 18 mm hypoattenuated nodule within the spleen, likely benign. Musculoskeletal: Findings of diffuse idiopathic skeletal hyperostosis of the thoracic spine Review of the MIP images confirms the above findings. IMPRESSION: Enlarged heart with interstitial pulmonary edema and bilateral pleural effusions. Superimposed patchy airspace consolidation in both lungs, more confluent in the lower lobes, suspicious for multifocal pneumonia. Borderline mediastinal lymphadenopathy, likely reactive. Aortic Atherosclerosis (ICD10-I70.0). Electronically Signed  By: Ted Mcalpine M.D.   On: 03/12/2018 12:50   Dg Chest Port 1 View  Result Date: 02/15/2018 CLINICAL DATA:  PICC Line placement EXAM: PORTABLE CHEST 1 VIEW COMPARISON:  02/15/2018 FINDINGS: Interval placement of RIGHT-sided PICC line, tip overlying the superior vena cava. Endotracheal tube is in place with tip approximately 1 centimeter above the carina. A nasogastric tube is in place, tip off the image beyond the gastroesophageal junction. The heart is enlarged and stable in configuration. There are patchy infiltrates in the lungs bilaterally. Bilateral pleural effusions are stable. IMPRESSION: Interval placement of RIGHT-sided PICC line, tip to the superior vena cava. Stable bilateral pleural effusions and parenchymal opacities. Electronically Signed   By: Norva Pavlov M.D.   On: 02/15/2018 09:40    Dg Chest Port 1 View  Result Date: 02/15/2018 CLINICAL DATA:  Acute respiratory failure EXAM: PORTABLE CHEST 1 VIEW COMPARISON:  03/06/2018 FINDINGS: Cardiac shadow is stable. Endotracheal tube is noted at the level of the carina. This could be withdrawn 2-3 cm. Nasogastric catheter extends into the stomach. Vascular congestion and increasing pleural effusions are seen. Bibasilar atelectasis/infiltrate is noted as well. IMPRESSION: Endotracheal tube at the level of the carina. This could be withdrawn 2-3 cm. Persistent changes of CHF with increasing effusions bilaterally. Bibasilar opacities consistent with atelectasis or infiltrate. Electronically Signed   By: Alcide Clever M.D.   On: 02/15/2018 07:26   Dg Chest Port 1 View  Result Date: 03/07/2018 CLINICAL DATA:  Initial evaluation for sepsis, post intubation. EXAM: PORTABLE CHEST 1 VIEW COMPARISON:  Prior radiograph from 08/17/2017 FINDINGS: Endotracheal tube in place with tip position approximately 15 mm above the carina. Enteric tube courses into the abdomen with side hole beyond the GE junction. Median sternotomy wires underlying surgical clips noted, stable. Cardiomegaly unchanged. Mediastinal silhouette normal. Aortic atherosclerosis. Lungs are mildly hypoinflated. Diffuse pulmonary vascular and interstitial congestion, suggesting pulmonary interstitial edema. There are superimposed confluent multifocal opacities at the right mid and lower lung as well as the left lung base, suspicious for infiltrates. Associated small right pleural effusion. No pneumothorax. No acute osseus abnormality. IMPRESSION: 1. Tip of the endotracheal tube approximately 15 mm above the carina. 2. Cardiomegaly with diffuse pulmonary vascular and interstitial congestion, suggesting pulmonary interstitial edema. 3. Superimposed multifocal opacities at the right mid and lower lung and left lung base, suspicious for infiltrates. 4. Small right pleural effusion. Electronically  Signed   By: Rise Mu M.D.   On: 02/15/2018 05:57   [place] Ekg Site Rite  Result Date: 02/14/2018 If Site Rite image not attached, placement could not be confirmed due to current cardiac rhythm.   Consults: Treatment Team:  Iran Ouch, MD   Subjective:    Overnight Issues: Patient intubated, sedated with atrial fibrillation.  Patient failed spontaneous breathing trial yesterday  Objective:  Vital signs for last 24 hours: Temp:  [96.8 F (36 C)-98.9 F (37.2 C)] 98.1 F (36.7 C) (12/04 0800) Pulse Rate:  [53-153] 86 (12/04 0900) Resp:  [15-27] 24 (12/04 0900) BP: (75-117)/(49-88) 106/49 (12/04 0900) SpO2:  [95 %-99 %] 99 % (12/04 0900) FiO2 (%):  [28 %] 28 % (12/04 0307) Weight:  [62.4 kg] 62.4 kg (12/04 0500)  Intake/Output from previous day: 12/03 0701 - 12/04 0700 In: 637 [I.V.:536.9; IV Piggyback:100.1] Out: 485 [Urine:485]  Intake/Output this shift: No intake/output data recorded.  Vent settings for last 24 hours: Vent Mode: PRVC FiO2 (%):  [28 %] 28 % Set Rate:  [16 bmp] 16  bmp Vt Set:  [400 mL] 400 mL PEEP:  [5 cmH20] 5 cmH20 Plateau Pressure:  [17 cmH20-27 cmH20] 25 cmH20  Physical Exam:  Vital signs: Please see the above listed vital signs HEENT: Patient is orally intubated, trachea midline, no thyromegaly appreciated Cardiovascular: Irregularly iregular rhythm with rapid ventricular response Pulmonary: Coarse rhonchi and rales appreciated diffusely Abdominal: Positive bowel sounds, soft exam Extremities: No clubbing, cyanosis or edema noted Neurologic: Limited exam as patient is on mechanical ventilation  Assessment/Plan:   Hypoxemic respiratory failure.  Multifactorial etiology to include decompensated heart failure, bilateral effusions and interstitial edema noted on CT scan and chest x-ray.  Possible superimposed pneumonia with patchy airspace disease.  Diuresis has been limited secondary to marginal blood pressures.  Patient has been  empirically on cefepime for possible superimposed pneumonia.  His Chest x-ray shows pulmonary edema and cardiomegaly  begin spontaneous awakening and breathing trials today  NSTEMI.  Troponin decreasing to 17.5, being followed by cardiology, on aspirin and heparin.  No ischemic changes appreciated on EKG. echocardiogram performed revealed reduced ejection fraction at 35 to 40% with significant hypokinesia consistent with stress-induced cardiomyopathy versus LAD infarction.  Additional medicines have been limited secondary to marginal blood pressure  Atrial fibrillation.  Patient was loaded on digoxin, metoprolol was added   Prerenal azotemia  Hyperglycemia.  Scale coverage  We will try again spontaneous awakening and breathing trial this morning  Adam KindredJohn Jermiah Howton, DO Critical Care Total Time. 40 minutes  Adam Roberson 02/16/2018  *Care during the described time interval was provided by me and/or other providers on the critical care team.  I have reviewed this patient's available data, including medical history, events of note, physical examination and test results as part of my evaluation. Patient ID: Adam Roberson, male   DOB: 11/15/1928, 84 y.o.   MRN: [id number] Patient ID: Adam Roberson, male   DOB: 11/15/1928, 84 y.o.   MRN: [id number]

## 2018-02-16 NOTE — Progress Notes (Signed)
Progress Note  Patient Name: Adam Roberson [facility] Date of Encounter: 02/16/2018  Primary Cardiologist: [name]  Subjective    intubated, agitation this morning with associated tachycardia rate up to 140s Off sedation most of the day, resting comfortably heart rate 70-80 atrial fibrillation  No family present at this time.  FiO2 28% Chest x-ray with stable bilateral pleural effusions, parenchymal opacities Troponin trending down, on heparin infusion  Inpatient Medications    Scheduled Meds: . aspirin  324 mg Per Tube Daily  . chlorhexidine gluconate (MEDLINE KIT)  15 mL Mouth Rinse BID  . docusate  100 mg Per Tube BID  . furosemide  20 mg Intravenous BID  . mouth rinse  15 mL Mouth Rinse 10 times per day  . metoprolol tartrate  25 mg Oral BID  . multivitamin  15 mL Per Tube Daily  . pantoprazole sodium  40 mg Per Tube Daily   Continuous Infusions: . ceFEPime (MAXIPIME) IV Stopped (02/16/18 0093)  . dexmedetomidine (PRECEDEX) IV infusion Stopped (02/16/18 1331)  . feeding supplement (VITAL HIGH PROTEIN) Stopped (02/16/18 0946)  . fentaNYL infusion INTRAVENOUS Stopped (02/16/18 0937)  . heparin 950 Units/hr (02/16/18 1300)  . norepinephrine (LEVOPHED) Adult infusion    . propofol (DIPRIVAN) infusion Stopped (02/15/18 1150)   PRN Meds: acetaminophen **OR** acetaminophen, metoprolol tartrate, ondansetron **OR** ondansetron (ZOFRAN) IV, sodium chloride flush   Vital Signs    Vitals:   02/16/18 1300 02/16/18 1328 02/16/18 1330 02/16/18 1400  BP: 101/74  92/71 91/67  Pulse: 83  88 75  Resp: (!) 23  17 (!) 23  Temp:      TempSrc:      SpO2: 98% 98% 96% 97%  Weight:      Height:        Intake/Output Summary (Last 24 hours) at 02/16/2018 1416 Last data filed at 02/16/2018 1300 Gross per 24 hour  Intake 2995.27 ml  Output 685 ml  Net 2310.27 ml   Filed Weights   02/14/18 0500 02/15/18 0346 02/16/18 0500  Weight: 62.8 kg 62 kg 62.4 kg    Telemetry    Afib with  RVR with ventricular rates currently 70-80 personally Reviewed This morning with periodic rates up to 140s in the setting of agitation  ECG    n/a - Personally Reviewed  Physical Exam    Constitutional: Intubated and sedated, peaceful HENT:  Head: Grossly normal Eyes:  no discharge. No scleral icterus.  Neck: No JVD, no carotid bruits  Cardiovascular: Regular rate and rhythm, no murmurs appreciated Pulmonary/Chest: Dullness at the bases, bronchial breath sounds Abdominal: Soft.  no distension.  no tenderness.  Musculoskeletal: Sedated, intubated, unable to test Neurological: Unable to test, intubated Skin: Skin warm and dry Psychiatric: [place]  Lab 03/15/2018 0527 02/14/18 0712  02/15/18 0352 02/15/18 1253 02/16/18 0410  NA 138 139  --  140  --  143  K 3.3* 5.0   < > 3.1* 4.3 3.8  CL 100 112*  --  107  --  111  CO2 27 21*  --  26  --  28  GLUCOSE 172* 107*  --  141*  --  201*  BUN 20 13  --  16  --  27*  CREATININE 0.96 0.77  --  1.09  --  1.00  CALCIUM 8.7* 7.4*  --  7.0*  --  7.7*  PROT 7.7 5.3*  --  5.5*  --   --  ALBUMIN 3.6 2.4*  --  2.4*  --   --   AST 102* 69*  --  99*  --   --   ALT 17 25  --  28  --   --   ALKPHOS 86 56  --  59  --   --   BILITOT 0.6 1.0  --  0.4  --   --   GFRNONAA >60 >60  --  >60  --  >60  GFRAA >60 >60  --  >60  --  >60  ANIONGAP 11 6  --  7  --  4*   < > = values in this interval not displayed.     Hematology Recent Labs  Lab 03/12/2018 0527  03/12/2018 2356 02/14/18 0712 02/16/18 0410  WBC 12.5*  --   --  9.2 9.8  RBC 5.06  --   --  4.35 4.13*  HGB 14.9   < > 13.1 12.9* 12.1*  HCT 45.4   < > 41.4 39.8 38.6*  MCV 89.7  --   --  91.5 93.5  MCH 29.4  --   --  29.7 29.3  MCHC 32.8  --   --  32.4 31.3  RDW 13.1  --   --  13.3 13.5  PLT 253  --   --  160 141*   < > = values in this interval not displayed.    Cardiac Enzymes Recent Labs  Lab 03/08/2018 1255 02/21/2018 1628 02/14/18 1802  02/16/18 0410  TROPONINI 19.25* 24.64* 22.15* 17.50*   No results for input(s): TROPIPOC in the last 168 hours.   BNPNo results for input(s): BNP, PROBNP in the last 168 hours.   DDimer No results for input(s): DDIMER in the last 168 hours.   Radiology    Ct Angio Chest Pe W Or Wo Contrast  Result Date: 03/12/2018 IMPRESSION: Enlarged heart with interstitial pulmonary edema and bilateral pleural effusions. Superimposed patchy airspace consolidation in both lungs, more confluent in the lower lobes, suspicious for multifocal pneumonia. Borderline mediastinal lymphadenopathy, likely reactive. Aortic Atherosclerosis (ICD10-I70.0). Electronically Signed   By: Fidela Salisbury M.D.   On: 03/09/2018 12:50   Dg Chest Port 1 View  Result Date: 02/15/2018 IMPRESSION: Endotracheal tube at the level of the carina. This could be withdrawn 2-3 cm. Persistent changes of CHF with increasing effusions bilaterally. Bibasilar opacities consistent with atelectasis or infiltrate. Electronically Signed   By: Inez Catalina M.D.   On: 02/15/2018 07:26     Cardiac Studies   Echo 02/17/2018: Study Conclusions  - Left ventricle: The cavity size was normal. There was mild concentric hypertrophy. Systolic function was moderately reduced. The estimated ejection fraction was in the range of 35% to 40%. Akinesis of the mid-apicalanteroseptal, anterior, inferior, inferoseptal, and apical myocardium. - Aortic valve: There was mild regurgitation. - Mitral valve: Calcified annulus. There was moderate regurgitation. - Left atrium: The atrium was mildly dilated. - Tricuspid valve: There was moderate regurgitation. - Pulmonary arteries: Systolic pressure was mildly to moderately increased. PA peak pressure: 44 mm Hg (S).  Impressions:  - Wall motion abnormalities suggestive of stress induced cardiomyopathy vs. LAD infarct.  Patient Profile     84 y.o. male with history of CAD s/p 3-vessel  CABG in [place], permanent Afib on Coumadin, HFpEF, CVA, DVT, HTN, HLD dementia, and Parkinson's disease who we are asked to evaluate for elevated troponin in the setting of acute respiratory failure with hypoxia in the setting of suspected ARDS/PNA/acute  systolic and acute on chronic diastolic CHF.   Assessment & Plan    1. NSTEMI: -Troponin peaked at 24.64 on 12/1 Enzymes now trending down Concern for ischemic cardiomyopathy, though unable to exclude stress cardia myopathy -Continue heparin infusion Seen by interventional cardiology yesterday.  Decision made to defer catheterization at this time Complicating issues include patient's advanced age and comorbid conditions, dementia, agitation as demonstrated this morning -No family at the bedside for discussion Could have discussion once extubated  2. Acute systolic CHF/acute on chronic diastolic CHF: -Echo showed a reduced LVSF with an EF of 35-40% with WMA concerning for stress-induced cardiomyopathy vs LAD infarct Most medications on hold including beta blocker, ACEi/ARB/spironolactone/Entresto secondary to relative hypotension  3. Acute respiratory failure with hypoxia:  PNA and acute systolic CHF FiO2 79% Agitation this morning, may make it difficult to wean off the ventilator  4. Permanent Afib with RVR: Elevated rate in the setting agitation this morning now much improved rate as he has calm down and comfortable rate 70-80 Could give short acting anxiolytics as needed for agitation He does have metoprolol low-dose 2.5 mg as needed IV.  Likely be limited in the setting of hypotension  Case discussed with nursing  Total encounter time more than 25 minutes  Greater than 50% was spent in counseling and coordination of care with the patient   For questions or updates, please contact [place] Please consult [email or web address] for contact info under Cardiology/STEMI.    Signed, Christell Faith, PA-C [place] Pager: [phone] 02/16/2018, 2:16 PM

## 2018-02-17 ENCOUNTER — Inpatient Hospital Stay: Payer: Medicaid Other

## 2018-02-17 DIAGNOSIS — Z9911 Dependence on respirator [ventilator] status: Secondary | ICD-10-CM

## 2018-02-17 DIAGNOSIS — I420 Dilated cardiomyopathy: Secondary | ICD-10-CM

## 2018-02-17 DIAGNOSIS — J189 Pneumonia, unspecified organism: Secondary | ICD-10-CM

## 2018-02-17 LAB — HEPARIN LEVEL (UNFRACTIONATED): Heparin Unfractionated: 0.43 IU/mL (ref 0.30–0.70)

## 2018-02-17 LAB — CBC
HCT: 37.1 % — ABNORMAL LOW (ref 39.0–52.0)
Hemoglobin: 11.9 g/dL — ABNORMAL LOW (ref 13.0–17.0)
MCH: 29.4 pg (ref 26.0–34.0)
MCHC: 32.1 g/dL (ref 30.0–36.0)
MCV: 91.6 fL (ref 80.0–100.0)
PLATELETS: 152 10*3/uL (ref 150–400)
RBC: 4.05 MIL/uL — ABNORMAL LOW (ref 4.22–5.81)
RDW: 13.8 % (ref 11.5–15.5)
WBC: 9.2 10*3/uL (ref 4.0–10.5)
nRBC: 0 % (ref 0.0–0.2)

## 2018-02-17 LAB — GLUCOSE, CAPILLARY
Glucose-Capillary: 134 mg/dL — ABNORMAL HIGH (ref 70–99)
Glucose-Capillary: 139 mg/dL — ABNORMAL HIGH (ref 70–99)
Glucose-Capillary: 133 mg/dL — ABNORMAL HIGH (ref 70–99)
Glucose-Capillary: 166 mg/dL — ABNORMAL HIGH (ref 70–99)
Glucose-Capillary: 187 mg/dL — ABNORMAL HIGH (ref 70–99)

## 2018-02-17 MED ORDER — QUETIAPINE FUMARATE 25 MG PO TABS
25.0000 mg | ORAL_TABLET | Freq: Two times a day (BID) | ORAL | Status: DC
  Administered 2018-02-17 – 2018-02-22 (×10): 25 mg
  Filled 2018-02-17 (×10): qty 1

## 2018-02-17 MED ORDER — PRO-STAT SUGAR FREE PO LIQD
30.0000 mL | Freq: Every day | ORAL | Status: DC
  Administered 2018-02-17 – 2018-02-22 (×6): 30 mL

## 2018-02-17 MED ORDER — VITAL 1.5 CAL PO LIQD
1000.0000 mL | ORAL | Status: DC
  Administered 2018-02-17 – 2018-02-21 (×5): 1000 mL

## 2018-02-17 MED ORDER — METOPROLOL TARTRATE 5 MG/5ML IV SOLN
5.0000 mg | INTRAVENOUS | Status: AC
  Administered 2018-02-17: 5 mg via INTRAVENOUS

## 2018-02-17 NOTE — Progress Notes (Signed)
Pt tachypneic respiratory rate upper 30's and developed atrial fibrillation with rvr heart 130-140's during [facility], therefore instructed RN to restart Precedex and Fentanyl gtts.  Will continue to monitor and assess pt.

## 2018-02-17 NOTE — Progress Notes (Signed)
[facility] [place] [facility]      PATIENT NAME: Adam Roberson    MR#:  [id number]  DATE OF BIRTH:  10-24-1928  SUBJECTIVE:   Still on ventilation, unable to wean off sedation and ventilation due to A. fib with RVR and tachypnea.  REVIEW OF SYSTEMS:    Review of Systems  Unable to perform ROS: Intubated    Nutrition: NPO Tolerating Diet: Tube feeds and yes Tolerating PT: Await Eval.    DRUG ALLERGIES:  No Known Allergies  VITALS:  Blood pressure (!) 89/62, pulse 79, temperature 99.3 F (37.4 C), temperature source Oral, resp. rate (!) 22, height 5\' 6"  (1.676 m), weight 63.2 kg, SpO2 98 %.  PHYSICAL EXAMINATION:   Physical Exam  GENERAL:  84 y.o.-year-old patient lying in bed intubated & sedated.   EYES: Pupils equal, round, reactive to light. No scleral icterus.   HEENT: Head atraumatic, normocephalic. ET and OG tubes in place.  NECK:  Supple, no jugular venous distention. No thyroid enlargement, no tenderness.  LUNGS: Normal breath sounds bilaterally, no wheezing, rales, upper airway rhonchi b/l . No use of accessory muscles of respiration.  CARDIOVASCULAR: Irregular rate and rhythm, tachycardia. No murmurs, rubs, or gallops.  ABDOMEN: Soft, nontender, nondistended. Bowel sounds present. No organomegaly or mass.  EXTREMITIES: No cyanosis, clubbing or edema b/l.    NEUROLOGIC: SEdated & Intubated.  PSYCHIATRIC: Sedated & Intubated.  SKIN: No obvious rash, lesion, or ulcer.    LABORATORY PANEL:   CBC Recent Labs  Lab 02/17/18 0645  WBC 9.2  HGB 11.9*  HCT 37.1*  PLT 152   ------------------------------------------------------------------------------------------------------------------  Chemistries  Recent Labs  Lab 02/15/18 0349 02/15/18 0352  02/16/18 0410  NA  --  140  --  143  K  --  3.1*   < > 3.8  CL  --  107  --  111  CO2  --  26  --  28  GLUCOSE  --  141*  --  201*  BUN  --  16  --  27*  CREATININE  --  1.09  --   1.00  CALCIUM  --  7.0*  --  7.7*  MG 2.0  --   --   --   AST  --  99*  --   --   ALT  --  28  --   --   ALKPHOS  --  59  --   --   BILITOT  --  0.4  --   --    < > = values in this interval not displayed.   ------------------------------------------------------------------------------------------------------------------  Cardiac Enzymes Recent Labs  Lab 02/16/18 0410  TROPONINI 17.50*   ------------------------------------------------------------------------------------------------------------------  RADIOLOGY:  Dg Chest Port 1 View  Result Date: 02/17/2018 CLINICAL DATA:  Mechanically assisted ventilation. EXAM: PORTABLE CHEST 1 VIEW COMPARISON:  02/16/2018. FINDINGS: Endotracheal tube again terminates approximately 5 mm above the carina. Proximal repositioning of approximately 2-3 cm again suggested. Interim placement of right PICC line, its tip is over the right atrium. NG tube noted with tip below left hemidiaphragm. Prior CABG. Cardiomegaly with bilateral pulmonary infiltrates/edema again noted. Small bilateral pleural effusions again noted. Findings suggest congestive heart failure. No pneumothorax. IMPRESSION: 1. Endotracheal tube tip again noted 5 mm above the carina, proximal repositioning of approximately 2 3 cm again suggested. 2. Interim placement of PICC line. Its tip is over the right atrium. 3. Prior CABG. Findings consistent with congestive heart failure bilateral pulmonary edema  and bilateral pleural effusions again noted. These results will be called to the ordering clinician or representative by the Radiologist Assistant, and communication documented in the PACS or zVision Dashboard. Electronically Signed   By: Maisie Fushomas  Register   On: 02/17/2018 13:00   Dg Chest Port 1 View  Result Date: 02/16/2018 CLINICAL DATA:  Respiratory distress.  Patient on ventilation. EXAM: PORTABLE CHEST 1 VIEW COMPARISON:  February 15, 2018 FINDINGS: The ETT terminates slightly above the carina,  5 mm. An NG tube terminates below today's film. A right PICC line terminates in the SVC. Diffuse interstitial opacities. Bilateral effusions. More focal infiltrate in the left base. Stable cardiomediastinal silhouette. IMPRESSION: 1. The ETT terminates slightly above the carina, 5 mm. Recommend withdrawing 2 cm. 2. Other support apparatus as above. 3. Diffuse interstitial opacities and bilateral effusions. 4. More focal infiltrate in the left base. These results will be called to the ordering clinician or representative by the Radiologist Assistant, and communication documented in the PACS or zVision Dashboard. Electronically Signed   By: Gerome Samavid  Williams III M.D   On: 02/16/2018 14:22     ASSESSMENT AND PLAN:   84 year old male with past medical history of consents disease, atrial fibrillation, hypertension, hyperlipidemia, history of previous CVA, previous history of DVT, chronic diastolic CHF who presented to the hospital due to shortness of breath and chest pain.  1.  Acute respiratory failure with hypoxia- secondary to a combination of mild CHF, pneumonia -Patient is currently intubated and sedated.  Continue vent support as per intensivist. -Continue Cefepime and lasix.  2.  Non-ST elevation MI-patient presented to the hospital complaining of vague chest pain for the past 2 days prior to admission.  he has ruled in by cardiac markers. - Seen by cardiology and echocardiogram reviewed showing stress-induced cardiomyopathy or possibly LAD disease. - Seen by cardiology, continue heparin drip for now. - cont. Low dose Metoprolol.    3.  Atrial fibrillation with rapid ventricle response-HR was up to 150s due to spontaneous awakening and breathing trials.  continue IV metoprolol pushes and oral metoprolol for now. Loaded on digoxin. - cont. Heparin gtt.    4.  History of previous DVT- continue heparin drip for now.  5.  History of Parkinson's disease-we will resume Sinemet once patient can take  p.o.  6.  Essential hypertension- cont. Low dose Metoprolol.   7.  Acute on chronic systolic CHF, EF of 35-40% cont. IV low-dose Lasix and low-dose metoprolol.  8. Hypokalemia - improved w/ supplementation.  Discussed with Dr. Lonn Georgiaonforti. All the records are reviewed and case discussed with Care Management/Social Worker. Management plans discussed with the patient, family and they are in agreement.  CODE STATUS: Full code  DVT Prophylaxis: Heparin gtt  TOTAL TIME TAKING CARE OF THIS PATIENT: 25 minutes.   POSSIBLE D/C unclear , DEPENDING ON CLINICAL CONDITION and progress.    Shaune PollackQing Jackie Russman M.D on 02/17/2018 at 3:51 PM  Between 7am to 6pm - Pager - [phone]  After 6pm go to [email or web address] - Therapist, nutritionalpassword EPAS [facility]  [facility]  Office  [phone]  CC: Primary care physician; [facility]

## 2018-02-17 NOTE — Progress Notes (Signed)
Progress Note  Patient Name: Adam Roberson [facility] Date of Encounter: 02/17/2018  Primary Cardiologist: [name]  Subjective   Sedation weaned this morning, agitation, heart rate up to 140 atrial fibrillation Back on sedation,  Heart rate back down to 07-86 systolic No family at the bedside Case discussed with ICU team  Inpatient Medications    Scheduled Meds: . aspirin  324 mg Per Tube Daily  . chlorhexidine gluconate (MEDLINE KIT)  15 mL Mouth Rinse BID  . docusate  100 mg Per Tube BID  . feeding supplement (PRO-STAT SUGAR FREE 64)  30 mL Per Tube Daily  . feeding supplement (VITAL 1.5 CAL)  1,000 mL Per Tube Q24H  . furosemide  20 mg Intravenous BID  . mouth rinse  15 mL Mouth Rinse 10 times per day  . metoprolol tartrate  25 mg Oral BID  . multivitamin  15 mL Per Tube Daily  . pantoprazole sodium  40 mg Per Tube Daily   Continuous Infusions: . ceFEPime (MAXIPIME) IV Stopped (02/17/18 0559)  . dexmedetomidine (PRECEDEX) IV infusion 0.4 mcg/kg/hr (02/17/18 1501)  . fentaNYL infusion INTRAVENOUS 50 mcg/hr (02/17/18 1501)  . heparin 950 Units/hr (02/17/18 1501)  . norepinephrine (LEVOPHED) Adult infusion    . propofol (DIPRIVAN) infusion Stopped (02/15/18 1150)   PRN Meds: acetaminophen **OR** acetaminophen, metoprolol tartrate, ondansetron **OR** ondansetron (ZOFRAN) IV, sodium chloride flush   Vital Signs    Vitals:   02/17/18 1401 02/17/18 1430 02/17/18 1500 02/17/18 1530  BP: 103/67 109/68 (!) 89/62   Pulse: 86 89 79   Resp: (!) 23 (!) 24 (!) 22   Temp:      TempSrc:      SpO2: 98% 98% 98% 98%  Weight:      Height:        Intake/Output Summary (Last 24 hours) at 02/17/2018 1710 Last data filed at 02/17/2018 1501 Gross per 24 hour  Intake 1348.97 ml  Output 2200 ml  Net -851.03 ml   Filed Weights   02/15/18 0346 02/16/18 0500 02/17/18 0301  Weight: 62 kg 62.4 kg 63.2 kg    Telemetry    Atrial fibrillation, rate is elevated this morning in the  setting of weaning sedation rate up to 140s  ECG    n/a - Personally Reviewed  Physical Exam   Constitutional: Intubated sedated HENT:  Head: Grossly normal Eyes:  no discharge. No scleral icterus.  Neck: Unable to estimate JVD, no carotid bruits  Cardiovascular:  irregularly irregular,no murmurs appreciated Pulmonary/Chest: Clear, scattered Rales Abdominal: Soft.  no distension.  no tenderness.  Musculoskeletal: Normal range of motion Neurological: Able to test, sedated Skin: Skin warm and dry Psychiatric: Intubated sedated   Labs    Chemistry Recent Labs  Lab 03/13/2018 0527 02/14/18 0712  02/15/18 0352 02/15/18 1253 02/16/18 0410  NA 138 139  --  140  --  143  K 3.3* 5.0   < > 3.1* 4.3 3.8  CL 100 112*  --  107  --  111  CO2 27 21*  --  26  --  28  GLUCOSE 172* 107*  --  141*  --  201*  BUN 20 13  --  16  --  27*  CREATININE 0.96 0.77  --  1.09  --  1.00  CALCIUM 8.7* 7.4*  --  7.0*  --  7.7*  PROT 7.7 5.3*  --  5.5*  --   --   ALBUMIN 3.6 2.4*  --  2.4*  --   --   AST 102* 69*  --  99*  --   --   ALT 17 25  --  28  --   --   ALKPHOS 86 56  --  59  --   --   BILITOT 0.6 1.0  --  0.4  --   --   GFRNONAA >60 >60  --  >60  --  >60  GFRAA >60 >60  --  >60  --  >60  ANIONGAP 11 6  --  7  --  4*   < > = values in this interval not displayed.     Hematology Recent Labs  Lab 02/14/18 0712 02/16/18 0410 02/17/18 0645  WBC 9.2 9.8 9.2  RBC 4.35 4.13* 4.05*  HGB 12.9* 12.1* 11.9*  HCT 39.8 38.6* 37.1*  MCV 91.5 93.5 91.6  MCH 29.7 29.3 29.4  MCHC 32.4 31.3 32.1  RDW 13.3 13.5 13.8  PLT 160 141* 152    Cardiac Enzymes Recent Labs  Lab 03/06/2018 1255 03/02/2018 1628 02/14/18 1802 02/16/18 0410  TROPONINI 19.25* 24.64* 22.15* 17.50*   No results for input(s): TROPIPOC in the last 168 hours.   BNPNo results for input(s): BNP, PROBNP in the last 168 hours.   DDimer No results for input(s): DDIMER in the last 168 hours.   Radiology    Ct Angio Chest  Pe W Or Wo Contrast  Result Date: 03/08/2018 IMPRESSION: Enlarged heart with interstitial pulmonary edema and bilateral pleural effusions. Superimposed patchy airspace consolidation in both lungs, more confluent in the lower lobes, suspicious for multifocal pneumonia. Borderline mediastinal lymphadenopathy, likely reactive. Aortic Atherosclerosis (ICD10-I70.0). Electronically Signed   By: Fidela Salisbury M.D.   On: 02/15/2018 12:50   Dg Chest Port 1 View  Result Date: 02/15/2018 IMPRESSION: Endotracheal tube at the level of the carina. This could be withdrawn 2-3 cm. Persistent changes of CHF with increasing effusions bilaterally. Bibasilar opacities consistent with atelectasis or infiltrate. Electronically Signed   By: Inez Catalina M.D.   On: 02/15/2018 07:26     Cardiac Studies   Echo 03/03/2018: Study Conclusions  - Left ventricle: The cavity size was normal. There was mild concentric hypertrophy. Systolic function was moderately reduced. The estimated ejection fraction was in the range of 35% to 40%. Akinesis of the mid-apicalanteroseptal, anterior, inferior, inferoseptal, and apical myocardium. - Aortic valve: There was mild regurgitation. - Mitral valve: Calcified annulus. There was moderate regurgitation. - Left atrium: The atrium was mildly dilated. - Tricuspid valve: There was moderate regurgitation. - Pulmonary arteries: Systolic pressure was mildly to moderately increased. PA peak pressure: 44 mm Hg (S).  Impressions:  - Wall motion abnormalities suggestive of stress induced cardiomyopathy vs. LAD infarct.  Patient Profile     84 y.o. male with history of CAD s/p 3-vessel CABG in [place], permanent Afib on Coumadin, HFpEF, CVA, DVT, HTN, HLD dementia, and Parkinson's disease who we are asked to evaluate for elevated troponin in the setting of acute respiratory failure with hypoxia in the setting of suspected ARDS/PNA/acute systolic and acute on chronic  diastolic CHF.   Assessment & Plan    1. NSTEMI: -Troponin peaked at 24.64 on 12/1, they have trended downward Concern for ischemic cardiomyopathy No family to discuss issues, Language barrier as patient is farsi speaking --- heparin infusion   Decision on arrival made to defer catheterization  Complicating issues include patient's advanced age and comorbid conditions, dementia, agitation when awake Could have discussion  once extubated  2. Acute systolic CHF/acute on chronic diastolic CHF: EF of 75-17% with WMA Unable to exclude stress-induced cardiomyopathy vs ischemia Medications on hold secondary to hypotension  3. Acute respiratory failure with hypoxia:  PNA and acute systolic CHF FiO2 00% Agitation this morning, may make it difficult to wean off the ventilator  Same thing happened yesterday --Consider Seroquel as sedation wean  4. Permanent Afib with RVR: Would continue beta-blocker as blood pressure allows Consider adding something such as Seroquel for anxiolytic as sedation weaned Agitation likely contributing to elevated rate up to 140s With sedation down to 80 and 90 bpm Other option would be to use amiodarone, other medications limited by hypotension  Case discussed with nursing and ICU team  Total encounter time more than 25 minutes  Greater than 50% was spent in counseling and coordination of care with the patient   For questions or updates, please contact [place] Please consult [email or web address] for contact info under Cardiology/STEMI.    Signed, Christell Faith, PA-C [place] Pager: [phone] 02/17/2018, 5:10 PM

## 2018-02-17 NOTE — Progress Notes (Signed)
Nutrition Follow-up  DOCUMENTATION CODES:   Not applicable  INTERVENTION:  Initiate new goal TF regimen of Vital 1.5 at 40 mL/hr (960 mL goal daily volume) + Pro-Stat 30 mL once daily per OGT. Provides 1540 kcal, 80 grams of protein, 730 mL H2O daily.  Continue free water flush of 30 mL Q4hrs to maintain tube patency.  Continue liquid MVI daily per tube.  Consider increasing bowel regimen as patient has not had a bowel movement yet this admission.  NUTRITION DIAGNOSIS:   Inadequate oral intake related to inability to eat as evidenced by NPO status.  Ongoing - addressing with TF regimen.  GOAL:   Provide needs based on ASPEN/SCCM guidelines  Met with TF regimen.  MONITOR:   Vent status, Labs, Weight trends, TF tolerance, I & O's  REASON FOR ASSESSMENT:   Ventilator    ASSESSMENT:   84 year old male with PMHx of CAD s/p CABG x 3 in [place], Parkinson's disease, HTN, HLD, hx DVT, hx CVA, chronic diastolic CHF, A-fib who is admitted with hypoxic respiratory failure related to decompensated heart failure, bilateral effusions, interstitial edema, possible superimposed PNA requiring intubation on 12/1, also with NSTEMI, and a reduced ejection fraction of 35-40% on echo 12/1.  Patient remains intubated and sedated. On PRVC mode with FiO2 28% and PEEP 5 cmH2O. Abdomen soft. Patient tolerating tube feeds but has not yet had a BM this admission.  Enteral Access: 18 Fr. OGT placed 12/1; terminates in stomach per chest x-ray 12/3; 60 cm at corner of mouth  MAP: 71-96 mmHg  TF: pt tolerating Vital High Protein at 45 mL/hr  Patient is currently intubated on ventilator support Ve: 10 L/min Temp (24hrs), Avg:98.7 F (37.1 C), Min:98 F (36.7 C), Max:99.5 F (37.5 C)  Propofol: N/A; pt now off propofol gtt  Medications reviewed and include: Colace 100 mg BID per tube, Lasix 20 mg BID IV, liquid MVI daily per tube, Protonix 40 mg daily per tube, cefepime, Precedex gtt, fentanyl  gtt, heparin gtt.  Labs reviewed: CBG 130-152. On 12/4 BUN 27 (increase from 16 on 12/3).  I/O: 2100 mL UOP yesterday (1.2 mL/kg/hr)  Weight trend: 63.2 kg on 12/5; +1.2 kg from admission  Discussed with RN and on rounds.  Diet Order:   Diet Order            Diet NPO time specified  Diet effective now             EDUCATION NEEDS:   No education needs have been identified at this time  Skin:  Skin Assessment: Reviewed RN Assessment(scattered ecchymosis)  Last BM:  Unknown/PTA  Height:   Ht Readings from Last 1 Encounters:  03/13/2018 _0  (1.676 m)   Weight:   Wt Readings from Last 1 Encounters:  02/17/18 63.2 kg   Ideal Body Weight:  64.5 kg  BMI:  Body mass index is 22.49 kg/m.  Estimated Nutritional Needs:   Kcal:  1562 (PSU 2003b w/ MSJ 1252, Ve 10, Tmax 37.5)  Protein:  75-95 grams (1.2-1.5 grams/kg)  Fluid:  1.5 L/day (25 mL/kg)  Willey Blade, MS, RD, LDN Office: [phone] Pager: [phone] After Hours/Weekend Pager: [phone]

## 2018-02-17 NOTE — Progress Notes (Addendum)
Spoke with Dr. Lonn Georgiaonforti and Annabelle Harmanana, NP about patient's heart rate being elevated 130-150's afib and off sedation since 0730 for vent wean.  Providers discussing patient's care and NP ordering IV metoprolol.

## 2018-02-17 NOTE — Progress Notes (Signed)
ANTICOAGULATION CONSULT NOTE - Initial Consult  Pharmacy Consult for heparin drip Indication: chest pain/ACS  No Known Allergies  Patient Measurements: Height: 5\' 6"  (167.6 cm) Weight: 139 lb 5.3 oz (63.2 kg) IBW/kg (Calculated) : 63.8 Heparin Dosing Weight: 66 kg  Vital Signs: Temp: 99 F (37.2 C) (12/05 0400) Temp Source: Oral (12/05 0400) BP: 97/58 (12/05 0600) Pulse Rate: 69 (12/05 0600)  Labs: Recent Labs    02/14/18 1802  02/15/18 0349 02/15/18 0352 02/16/18 0410 02/17/18 0645  HGB  --   --   --   --  12.1* 11.9*  HCT  --   --   --   --  38.6* 37.1*  PLT  --   --   --   --  141* 152  HEPARINUNFRC 0.82*   < > 0.49  --  0.54 0.43  CREATININE  --   --   --  1.09 1.00  --   TROPONINI 22.15*  --   --   --  17.50*  --    < > = values in this interval not displayed.    Estimated Creatinine Clearance: 46.5 mL/min (by C-G formula based on SCr of 1 mg/dL).   Medical History: Past Medical History:  Diagnosis Date  . CAD (coronary artery disease)    a.2003 s/p CABG x3 in [place]; b. ? 2014 PCI (records not available).  . Chronic diastolic CHF (congestive heart failure) (HCC)    a. 06/2015 Echo: EF 60-65%, mod dil LA, nl RV, mild to mod TR, PASP 46mmHg.  Marland Kitchen. DVT (deep venous thrombosis) (HCC)   . DVT of leg (deep venous thrombosis) (HCC)   . H/O: CVA (cerebrovascular accident)   . HOH (hard of hearing)   . Hyperlipidemia   . Hypertension   . Hypertensive heart disease   . Parkinson disease (HCC)   . Permanent atrial fibrillation    a. CHA2DS2VASc= 7-->coumadin (followed by primary care).  . Pneumonia    4/17    Medications:  Warfarin as outpatient  Assessment: Trop  26.62   Goal of Therapy:  Heparin level 0.3-0.7 units/ml Monitor platelets by anticoagulation protocol: Yes   Plan:  12/05 @ 0700 HL 0.43 therapeutic. Will continue rate at 950 units/hr and will recheck HL w/ am labs. CBC has been trending down, trops also trending down will continue to  monitor.  Thomasene Rippleavid Tymia Streb, PharmD, BCPS Clinical Pharmacist 02/17/2018

## 2018-02-17 NOTE — Progress Notes (Signed)
Follow up - Critical Care Medicine Note  Patient Details:    Adam Roberson is an 84 y.o. male.  With a past medical history remarkable for coronary artery disease, hypertension, atrial fibrillation, diastolic heart failure, presented to the emergency department via EMS secondary to aggressive respiratory failure with hypoxemia.  Patient was subsequently intubated and admitted to the intensive care unit.  Was treated for respiratory failure and pneumonia.  Also found to have NSTEMI  Lines, Airways, Drains: Airway (Active)     Airway 8 mm (Active)  Secured at (cm) 22 cm 02/14/2018  8:47 AM  Measured From Lips 02/14/2018  8:47 AM  Secured Location Center 02/14/2018  8:47 AM  Secured By [employer] Tube Holder 02/14/2018  8:47 AM  Tube Holder Repositioned Yes 02/14/2018  2:38 AM  Cuff Pressure (cm H2O) 27 cm H2O 02/14/2018  8:47 AM  Site Condition Dry 02/14/2018  8:47 AM     NG/OG Tube Orogastric 18 Fr. Center mouth Xray;Aucultation Documented cm marking at nare/ corner of mouth 60 cm (Active)  Cm Marking at Nare/Corner of Mouth (if applicable) 60 cm 02/14/2018  4:00 AM  Site Assessment Clean;Dry;Intact 02/14/2018  4:00 AM  Ongoing Placement Verification No change in cm markings or external length of tube from initial placement 02/14/2018  4:00 AM  Status Suction-low intermittent 02/14/2018  4:00 AM  Amount of suction 80 mmHg 02/14/2018  4:00 AM  Drainage Appearance Green 02/14/2018  4:00 AM     Urethral Catheter April RN Straight-tip 16 Fr. (Active)  Indication for Insertion or Continuance of Catheter Unstable critical patients (first 24-48 hours) 02/14/2018  4:00 AM  Site Assessment Clean;Intact 02/14/2018  4:00 AM  Catheter Maintenance Bag below level of bladder;Catheter secured;Drainage bag/tubing not touching floor;Insertion date on drainage bag;No dependent loops;Seal intact 02/14/2018  8:00 AM  Collection Container Standard drainage bag 02/14/2018  4:00 AM  Securement Method Leg strap 02/14/2018   4:00 AM  Urinary Catheter Interventions Unclamped 02/14/2018  4:00 AM  Output (mL) 100 mL 02/14/2018  5:46 AM    Anti-infectives:  Anti-infectives (From admission, onward)   Start     Dose/Rate Route Frequency Ordered Stop   03/01/2018 1800  ceFEPIme (MAXIPIME) 2 g in sodium chloride 0.9 % 100 mL IVPB     2 g 200 mL/hr over 30 Minutes Intravenous Every 12 hours 03/12/2018 0654     03/01/2018 1600  vancomycin (VANCOCIN) IVPB 1000 mg/200 mL premix  Status:  Discontinued     1,000 mg 200 mL/hr over 60 Minutes Intravenous Every 18 hours 02/14/2018 0831 02/14/18 1031   02/14/2018 0600  oseltamivir (TAMIFLU) capsule 75 mg  Status:  Discontinued     75 mg Oral  Once 03/02/2018 0555 02/24/2018 0758   02/17/2018 0530  ceFEPIme (MAXIPIME) 2 g in sodium chloride 0.9 % 100 mL IVPB     2 g 200 mL/hr over 30 Minutes Intravenous  Once 02/21/2018 0524 03/14/2018 0636   02/25/2018 0530  metroNIDAZOLE (FLAGYL) IVPB 500 mg  Status:  Discontinued     500 mg 100 mL/hr over 60 Minutes Intravenous Every 8 hours 02/19/2018 0524 02/27/2018 1109   03/13/2018 0530  vancomycin (VANCOCIN) IVPB 1000 mg/200 mL premix     1,000 mg 200 mL/hr over 60 Minutes Intravenous  Once 03/08/2018 0524 02/14/2018 0859      Microbiology: Results for orders placed or performed during the hospital encounter of 02/20/2018  Blood Culture (routine x 2)     Status: None (Preliminary result)  Collection Time: 13-Oct-2017  5:27 AM  Result Value Ref Range Status   Specimen Description BLOOD RIGHT HAND  Final   Special Requests   Final    BOTTLES DRAWN AEROBIC AND ANAEROBIC Blood Culture adequate volume   Culture   Final    NO GROWTH 4 DAYS Performed at [facility], [address] [place], [place] [address]    Report Status PENDING  Incomplete  Blood Culture (routine x 2)     Status: None (Preliminary result)   Collection Time: 13-Oct-2017  5:27 AM  Result Value Ref Range Status   Specimen Description BLOOD LEFT FOREARM  Final   Special Requests   Final     BOTTLES DRAWN AEROBIC AND ANAEROBIC Blood Culture adequate volume   Culture   Final    NO GROWTH 4 DAYS Performed at [facility], [address] [place], [place] [address]    Report Status PENDING  Incomplete  Urine culture     Status: None   Collection Time: 13-Oct-2017  5:27 AM  Result Value Ref Range Status   Specimen Description   Final    URINE, RANDOM Performed at [facility], [address] [place], [place] [address]    Special Requests   Final    NONE Performed at [facility], [address] [place], [place] [address]    Culture   Final    NO GROWTH Performed at [facility], [address] [place], [place] [address]    Report Status 02/14/2018 FINAL  Final  MRSA PCR Screening     Status: None   Collection Time: 13-Oct-2017  8:32 AM  Result Value Ref Range Status   MRSA by PCR NEGATIVE NEGATIVE Final    Comment:        The GeneXpert MRSA Assay (FDA approved for NASAL specimens only), is one component of a comprehensive MRSA colonization surveillance program. It is not intended to diagnose MRSA infection nor to guide or monitor treatment for MRSA infections. Performed at [facility], [address] [place], [place] [address]   Studies: Ct Angio Chest Pe W Or Wo Contrast  Result Date: 02/14/2018 CLINICAL DATA:  Decreased oxygen saturation requiring intubation. EXAM: CT ANGIOGRAPHY CHEST WITH CONTRAST TECHNIQUE: Multidetector CT imaging of the chest was performed using the standard protocol during bolus administration of intravenous contrast. Multiplanar CT image reconstructions and MIPs were obtained to evaluate the vascular anatomy. CONTRAST:  75mL OMNIPAQUE IOHEXOL 350 MG/ML SOLN COMPARISON:  Chest radiograph November 18, 2017 FINDINGS: Cardiovascular: Satisfactory opacification of the pulmonary arteries to the segmental level. No evidence of pulmonary embolism. Enlarged heart. No pericardial effusion. Calcific  atherosclerotic disease of the coronary arteries and aorta. Annular calcifications of the mitral valve. Mediastinum/Nodes: Borderline enlarged bilateral mediastinal lymph nodes. The patient is intubated. Main bronchi are patent. Enteric catheter within the esophagus, terminates in the distal gastric body. Lungs/Pleura: Moderate right and small left pleural effusions. Mild interstitial pulmonary edema. Scattered ground-glass opacities throughout both lungs, with more confluent appearance in the lower lobes, left greater than right. Upper Abdomen: Right renal cyst. 18 mm hypoattenuated nodule within the spleen, likely benign. Musculoskeletal: Findings of diffuse idiopathic skeletal hyperostosis of the thoracic spine Review of the MIP images confirms the above findings. IMPRESSION: Enlarged heart with interstitial pulmonary edema and bilateral pleural effusions. Superimposed patchy airspace consolidation in both lungs, more confluent in the lower lobes, suspicious for multifocal pneumonia. Borderline mediastinal lymphadenopathy, likely reactive. Aortic Atherosclerosis (ICD10-I70.0). Electronically Signed  By: Ted Mcalpine M.D.   On: 02/17/2018 12:50   Dg Chest Port 1 View  Result Date: 02/16/2018 CLINICAL DATA:  Respiratory distress.  Patient on ventilation. EXAM: PORTABLE CHEST 1 VIEW COMPARISON:  February 15, 2018 FINDINGS: The ETT terminates slightly above the carina, 5 mm. An NG tube terminates below today's film. A right PICC line terminates in the SVC. Diffuse interstitial opacities. Bilateral effusions. More focal infiltrate in the left base. Stable cardiomediastinal silhouette. IMPRESSION: 1. The ETT terminates slightly above the carina, 5 mm. Recommend withdrawing 2 cm. 2. Other support apparatus as above. 3. Diffuse interstitial opacities and bilateral effusions. 4. More focal infiltrate in the left base. These results will be called to the ordering clinician or representative by the Radiologist  Assistant, and communication documented in the PACS or zVision Dashboard. Electronically Signed   By: Gerome Sam III M.D   On: 02/16/2018 14:22   Dg Chest Port 1 View  Result Date: 02/15/2018 CLINICAL DATA:  PICC Line placement EXAM: PORTABLE CHEST 1 VIEW COMPARISON:  02/15/2018 FINDINGS: Interval placement of RIGHT-sided PICC line, tip overlying the superior vena cava. Endotracheal tube is in place with tip approximately 1 centimeter above the carina. A nasogastric tube is in place, tip off the image beyond the gastroesophageal junction. The heart is enlarged and stable in configuration. There are patchy infiltrates in the lungs bilaterally. Bilateral pleural effusions are stable. IMPRESSION: Interval placement of RIGHT-sided PICC line, tip to the superior vena cava. Stable bilateral pleural effusions and parenchymal opacities. Electronically Signed   By: Norva Pavlov M.D.   On: 02/15/2018 09:40   Dg Chest Port 1 View  Result Date: 02/15/2018 CLINICAL DATA:  Acute respiratory failure EXAM: PORTABLE CHEST 1 VIEW COMPARISON:  02/21/2018 FINDINGS: Cardiac shadow is stable. Endotracheal tube is noted at the level of the carina. This could be withdrawn 2-3 cm. Nasogastric catheter extends into the stomach. Vascular congestion and increasing pleural effusions are seen. Bibasilar atelectasis/infiltrate is noted as well. IMPRESSION: Endotracheal tube at the level of the carina. This could be withdrawn 2-3 cm. Persistent changes of CHF with increasing effusions bilaterally. Bibasilar opacities consistent with atelectasis or infiltrate. Electronically Signed   By: Alcide Clever M.D.   On: 02/15/2018 07:26   Dg Chest Port 1 View  Result Date: 02/18/2018 CLINICAL DATA:  Initial evaluation for sepsis, post intubation. EXAM: PORTABLE CHEST 1 VIEW COMPARISON:  Prior radiograph from 08/17/2017 FINDINGS: Endotracheal tube in place with tip position approximately 15 mm above the carina. Enteric tube courses into  the abdomen with side hole beyond the GE junction. Median sternotomy wires underlying surgical clips noted, stable. Cardiomegaly unchanged. Mediastinal silhouette normal. Aortic atherosclerosis. Lungs are mildly hypoinflated. Diffuse pulmonary vascular and interstitial congestion, suggesting pulmonary interstitial edema. There are superimposed confluent multifocal opacities at the right mid and lower lung as well as the left lung base, suspicious for infiltrates. Associated small right pleural effusion. No pneumothorax. No acute osseus abnormality. IMPRESSION: 1. Tip of the endotracheal tube approximately 15 mm above the carina. 2. Cardiomegaly with diffuse pulmonary vascular and interstitial congestion, suggesting pulmonary interstitial edema. 3. Superimposed multifocal opacities at the right mid and lower lung and left lung base, suspicious for infiltrates. 4. Small right pleural effusion. Electronically Signed   By: Rise Mu M.D.   On: 03/02/2018 05:57   [place] Ekg Site Rite  Result Date: 02/14/2018 If Site Rite image not attached, placement could not be confirmed due to current cardiac rhythm.   Consults:  Treatment Team:  Iran Ouch, MD   Subjective:    Overnight Issues: Patient intubated, sedated with atrial fibrillation.  Patient failed spontaneous breathing trial yesterday  Objective:  Vital signs for last 24 hours: Temp:  [98 F (36.7 C)-99.5 F (37.5 C)] 98 F (36.7 C) (12/05 0730) Pulse Rate:  [60-135] 75 (12/05 0800) Resp:  [14-26] 14 (12/05 0800) BP: (89-125)/(49-84) 103/66 (12/05 0800) SpO2:  [96 %-100 %] 99 % (12/05 0800) FiO2 (%):  [28 %] 28 % (12/05 0751) Weight:  [63.2 kg] 63.2 kg (12/05 0301)  Intake/Output from previous day: 12/04 0701 - 12/05 0700 In: 3181.3 [I.V.:592.8; WU/JW:1191.4; IV Piggyback:300] Out: 2100 [Urine:2100]  Intake/Output this shift: Total I/O In: 70.7 [I.V.:25.7; NG/GT:45] Out: -   Vent settings for last 24 hours: Vent Mode:  PRVC FiO2 (%):  [28 %] 28 % Set Rate:  [16 bmp] 16 bmp Vt Set:  [400 mL] 400 mL PEEP:  [5 cmH20] 5 cmH20 Pressure Support:  [10 cmH20] 10 cmH20 Plateau Pressure:  [21 cmH20-26 cmH20] 23 cmH20  Physical Exam:  Vital signs: Please see the above listed vital signs HEENT: Patient is orally intubated, trachea midline, no thyromegaly appreciated Cardiovascular: Irregularly iregular rhythm with rapid ventricular response Pulmonary: Coarse rhonchi and rales appreciated diffusely Abdominal: Positive bowel sounds, soft exam Extremities: No clubbing, cyanosis or edema noted Neurologic: Limited exam as patient is on mechanical ventilation  Assessment/Plan:   Hypoxemic respiratory failure.  Multifactorial etiology to include decompensated heart failure, bilateral effusions and interstitial edema noted on CT scan and chest x-ray.  Possible superimposed pneumonia with patchy airspace disease.  Diuresis has been limited secondary to marginal blood pressures.  Patient has been empirically on cefepime for possible superimposed pneumonia.  His Chest x-ray shows pulmonary edema and cardiomegaly  begin spontaneous awakening and breathing trials today  NSTEMI.  Troponin decreasing to 17.5, being followed by cardiology, on aspirin and heparin.  No ischemic changes appreciated on EKG. echocardiogram performed revealed reduced ejection fraction at 35 to 40% with significant hypokinesia consistent with stress-induced cardiomyopathy versus LAD infarction.  Additional medicines have been limited secondary to marginal blood pressure  Atrial fibrillation.  Patient was loaded on digoxin, metoprolol was added   Prerenal azotemia  Hyperglycemia.  Scale coverage  We will try again spontaneous awakening and breathing trial this morning  Tora Kindred, DO Critical Care Total Time. 40 minutes  Jordie Schreur 02/17/2018  *Care during the described time interval was provided by me and/or other providers on the critical  care team.  I have reviewed this patient's available data, including medical history, events of note, physical examination and test results as part of my evaluation. Patient ID: Masson Nalepa, male   DOB: 1928/09/29, 84 y.o.   MRN: [id number] Patient ID: Tracey Stewart, male   DOB: September 13, 1928, 84 y.o.   MRN: [id number] Patient ID: Dayna Geurts, male   DOB: December 11, 1928, 84 y.o.   MRN: [id number]

## 2018-02-18 ENCOUNTER — Inpatient Hospital Stay: Payer: Medicaid Other

## 2018-02-18 DIAGNOSIS — R0603 Acute respiratory distress: Secondary | ICD-10-CM

## 2018-02-18 DIAGNOSIS — I255 Ischemic cardiomyopathy: Secondary | ICD-10-CM

## 2018-02-18 LAB — BASIC METABOLIC PANEL
Anion gap: 6 (ref 5–15)
Anion gap: 6 (ref 5–15)
BUN: 40 mg/dL — ABNORMAL HIGH (ref 8–23)
BUN: 44 mg/dL — ABNORMAL HIGH (ref 8–23)
CO2: 32 mmol/L (ref 22–32)
CO2: 35 mmol/L — ABNORMAL HIGH (ref 22–32)
Calcium: 7.7 mg/dL — ABNORMAL LOW (ref 8.9–10.3)
Calcium: 7.7 mg/dL — ABNORMAL LOW (ref 8.9–10.3)
Chloride: 109 mmol/L (ref 98–111)
Chloride: 107 mmol/L (ref 98–111)
Creatinine, Ser: 0.87 mg/dL (ref 0.61–1.24)
Creatinine, Ser: 0.86 mg/dL (ref 0.61–1.24)
GFR calc Af Amer: >60 mL/min (ref >60)
GFR calc Af Amer: >60 mL/min (ref >60)
GFR calc non Af Amer: >60 mL/min (ref >60)
Glucose, Bld: 192 mg/dL — ABNORMAL HIGH (ref 70–99)
Glucose, Bld: 188 mg/dL — ABNORMAL HIGH (ref 70–99)
POTASSIUM: 2.7 mmol/L — AB (ref 3.5–5.1)
Potassium: 3 mmol/L — ABNORMAL LOW (ref 3.5–5.1)
Sodium: 147 mmol/L — ABNORMAL HIGH (ref 135–145)
Sodium: 148 mmol/L — ABNORMAL HIGH (ref 135–145)

## 2018-02-18 LAB — URINALYSIS, COMPLETE (UACMP) WITH MICROSCOPIC
Bilirubin Urine: NEGATIVE
Glucose, UA: NEGATIVE mg/dL
Ketones, ur: NEGATIVE mg/dL
Leukocytes, UA: NEGATIVE
Nitrite: NEGATIVE
Protein, ur: 30 mg/dL — AB
Specific Gravity, Urine: 1.011 (ref 1.005–1.030)
pH: 5 (ref 5.0–8.0)

## 2018-02-18 LAB — CBC
HCT: 36.1 % — ABNORMAL LOW (ref 39.0–52.0)
Hemoglobin: 11.7 g/dL — ABNORMAL LOW (ref 13.0–17.0)
MCH: 29.8 pg (ref 26.0–34.0)
MCHC: 32.4 g/dL (ref 30.0–36.0)
MCV: 91.9 fL (ref 80.0–100.0)
Platelets: 173 10*3/uL (ref 150–400)
RBC: 3.93 MIL/uL — ABNORMAL LOW (ref 4.22–5.81)
RDW: 13.8 % (ref 11.5–15.5)
WBC: 8.9 10*3/uL (ref 4.0–10.5)
nRBC: 0 % (ref 0.0–0.2)

## 2018-02-18 LAB — GLUCOSE, CAPILLARY
Glucose-Capillary: 114 mg/dL — ABNORMAL HIGH (ref 70–99)
Glucose-Capillary: 161 mg/dL — ABNORMAL HIGH (ref 70–99)
Glucose-Capillary: 165 mg/dL — ABNORMAL HIGH (ref 70–99)
Glucose-Capillary: 165 mg/dL — ABNORMAL HIGH (ref 70–99)
Glucose-Capillary: 173 mg/dL — ABNORMAL HIGH (ref 70–99)
Glucose-Capillary: 195 mg/dL — ABNORMAL HIGH (ref 70–99)

## 2018-02-18 LAB — CULTURE, BLOOD (ROUTINE X 2)
CULTURE: NO GROWTH
Culture: NO GROWTH
Special Requests: ADEQUATE
Special Requests: ADEQUATE

## 2018-02-18 LAB — PROCALCITONIN: Procalcitonin: 0.27 ng/mL

## 2018-02-18 LAB — HEPARIN LEVEL (UNFRACTIONATED)
Heparin Unfractionated: 0.32 IU/mL (ref 0.30–0.70)
Heparin Unfractionated: 0.4 IU/mL (ref 0.30–0.70)
Heparin Unfractionated: 0.45 IU/mL (ref 0.30–0.70)

## 2018-02-18 LAB — POTASSIUM: Potassium: 3.4 mmol/L — ABNORMAL LOW (ref 3.5–5.1)

## 2018-02-18 LAB — MAGNESIUM: Magnesium: 2 mg/dL (ref 1.7–2.4)

## 2018-02-18 MED ORDER — POTASSIUM CHLORIDE 20 MEQ/15ML (10%) PO SOLN
40.0000 meq | ORAL | Status: AC
  Administered 2018-02-18 (×2): 40 meq via ORAL
  Filled 2018-02-18 (×2): qty 30

## 2018-02-18 MED ORDER — INSULIN ASPART 100 UNIT/ML ~~LOC~~ SOLN
0.0000 [IU] | SUBCUTANEOUS | Status: DC
  Administered 2018-02-18 – 2018-02-20 (×12): 2 [IU] via SUBCUTANEOUS
  Administered 2018-02-20 (×2): 1 [IU] via SUBCUTANEOUS
  Administered 2018-02-21 (×6): 2 [IU] via SUBCUTANEOUS
  Administered 2018-02-22 (×2): 3 [IU] via SUBCUTANEOUS
  Administered 2018-02-22: 7 [IU] via SUBCUTANEOUS
  Filled 2018-02-18 (×21): qty 1

## 2018-02-18 MED ORDER — POTASSIUM CHLORIDE 10 MEQ/100ML IV SOLN
10.0000 meq | INTRAVENOUS | Status: AC
  Administered 2018-02-18 – 2018-02-19 (×2): 10 meq via INTRAVENOUS
  Filled 2018-02-18 (×2): qty 100

## 2018-02-18 NOTE — Progress Notes (Signed)
Dr. Lonn Georgiaonforti speaking with family and talking to grandaughter on the phone for family update.  Patient has been in pressure support and is tachypneic with labored breathing, afib rate 1 teens.  [place], RRT placing patient back on full vent support.

## 2018-02-18 NOTE — Progress Notes (Signed)
Notified Dana, NP of fever of 103, cultures to be ordered.

## 2018-02-18 NOTE — Progress Notes (Signed)
[facility] [place] [facility]      PATIENT NAME: Adam Roberson    MR#:  [id number]  DATE OF BIRTH:  05/18/1928  SUBJECTIVE:   Still on ventilation, unable to wean off sedation and ventilation. REVIEW OF SYSTEMS:    Review of Systems  Unable to perform ROS: Intubated    Nutrition: NPO Tolerating Diet: Tube feeds and yes Tolerating PT: Await Eval.    DRUG ALLERGIES:  No Known Allergies  VITALS:  Blood pressure (!) 105/51, pulse 92, temperature 98.8 F (37.1 C), temperature source Oral, resp. rate (!) 22, height 5\' 6"  (1.676 m), weight 63.2 kg, SpO2 98 %.  PHYSICAL EXAMINATION:   Physical Exam  GENERAL:  84 y.o.-year-old patient lying in bed intubated & sedated.   EYES: Pupils equal, round, reactive to light. No scleral icterus.   HEENT: Head atraumatic, normocephalic. ET and OG tubes in place.  NECK:  Supple, no jugular venous distention. No thyroid enlargement, no tenderness.  LUNGS: Normal breath sounds bilaterally, no wheezing, rales, upper airway rhonchi b/l . No use of accessory muscles of respiration.  CARDIOVASCULAR: Irregular rate and rhythm, tachycardia. No murmurs, rubs, or gallops.  ABDOMEN: Soft, nontender, nondistended. Bowel sounds present. No organomegaly or mass.  EXTREMITIES: No cyanosis, clubbing or edema b/l.    NEUROLOGIC: SEdated & Intubated.  PSYCHIATRIC: Sedated & Intubated.  SKIN: No obvious rash, lesion, or ulcer.  LABORATORY PANEL:   CBC Recent Labs  Lab 02/18/18 0329  WBC 8.9  HGB 11.7*  HCT 36.1*  PLT 173   ------------------------------------------------------------------------------------------------------------------  Chemistries  Recent Labs  Lab 02/15/18 0352  02/18/18 1341  NA 140   < > 148*  K 3.1*   < > 2.7*  CL 107   < > 107  CO2 26   < > 35*  GLUCOSE 141*   < > 188*  BUN 16   < > 44*  CREATININE 1.09   < > 0.86  CALCIUM 7.0*   < > 7.7*  MG  --   --  2.0  AST 99*  --   --   ALT 28  --    --   ALKPHOS 59  --   --   BILITOT 0.4  --   --    < > = values in this interval not displayed.   ------------------------------------------------------------------------------------------------------------------  Cardiac Enzymes Recent Labs  Lab 02/16/18 0410  TROPONINI 17.50*   ------------------------------------------------------------------------------------------------------------------  RADIOLOGY:  Dg Chest Port 1 View  Result Date: 02/18/2018 CLINICAL DATA:  Respiratory failure EXAM: PORTABLE CHEST 1 VIEW COMPARISON:  02/17/2018 and prior studies FINDINGS: An endotracheal tube with tip 2 cm above the carina, NG tube entering the stomach with tip off the field of view and RIGHT PICC line with tip overlying the SUPERIOR cavoatrial junction again noted. Cardiomediastinal silhouette is unchanged. CABG changes again identified. Bilateral interstitial opacities/edema again identified with bilateral pleural effusions. No pneumothorax. IMPRESSION: Unchanged appearance of the chest with pulmonary edema and pleural effusions. Electronically Signed   By: Harmon Pier M.D.   On: 02/18/2018 10:50   Dg Chest Port 1 View  Result Date: 02/17/2018 CLINICAL DATA:  Mechanically assisted ventilation. EXAM: PORTABLE CHEST 1 VIEW COMPARISON:  02/16/2018. FINDINGS: Endotracheal tube again terminates approximately 5 mm above the carina. Proximal repositioning of approximately 2-3 cm again suggested. Interim placement of right PICC line, its tip is over the right atrium. NG tube noted with tip below left hemidiaphragm. Prior CABG. Cardiomegaly with bilateral  pulmonary infiltrates/edema again noted. Small bilateral pleural effusions again noted. Findings suggest congestive heart failure. No pneumothorax. IMPRESSION: 1. Endotracheal tube tip again noted 5 mm above the carina, proximal repositioning of approximately 2 3 cm again suggested. 2. Interim placement of PICC line. Its tip is over the right atrium. 3.  Prior CABG. Findings consistent with congestive heart failure bilateral pulmonary edema and bilateral pleural effusions again noted. These results will be called to the ordering clinician or representative by the Radiologist Assistant, and communication documented in the PACS or zVision Dashboard. Electronically Signed   By: Maisie Fushomas  Register   On: 02/17/2018 13:00     ASSESSMENT AND PLAN:   84 year old male with past medical history of consents disease, atrial fibrillation, hypertension, hyperlipidemia, history of previous CVA, previous history of DVT, chronic diastolic CHF who presented to the hospital due to shortness of breath and chest pain.  1.  Acute respiratory failure with hypoxia- secondary to a combination of mild CHF, pneumonia -Patient is currently intubated and sedated.  Continue vent support as per intensivist. -Continue Cefepime and lasix.  2.  Non-ST elevation MI-patient presented to the hospital complaining of vague chest pain for the past 2 days prior to admission.  he has ruled in by cardiac markers. - Seen by cardiology and echocardiogram reviewed showing stress-induced cardiomyopathy or possibly LAD disease. - Seen by cardiology, continue heparin drip for now. - cont. Low dose Metoprolol.    3.  Atrial fibrillation with rapid ventricle response-HR was up to 150s due to spontaneous awakening and breathing trials.  continue IV metoprolol pushes and oral metoprolol for now. Loaded on digoxin. - cont. Heparin gtt.    4.  History of previous DVT- continue heparin drip for now.  5.  History of Parkinson's disease-we will resume Sinemet once patient can take p.o.  6.  Essential hypertension- cont. Low dose Metoprolol.   7.  Acute on chronic systolic CHF, EF of 35-40% cont. IV low-dose Lasix and low-dose metoprolol.  8. Hypokalemia, potassium decreased to 2.7, potassium supplement and follow-up level.  Magnesium is normal.  Hypernatremia.  Follow-up BMP while on Lasix.   Unable to give IV fluids due to CHF.  Discussed with Dr. Lonn Georgiaonforti. All the records are reviewed and case discussed with Care Management/Social Worker. Management plans discussed with the patient, family and they are in agreement.  CODE STATUS: Full code  DVT Prophylaxis: Heparin gtt  TOTAL TIME TAKING CARE OF THIS PATIENT: 25 minutes.   POSSIBLE D/C unclear , DEPENDING ON CLINICAL CONDITION and progress.    Shaune PollackQing Fortunata Betty M.D on 02/18/2018 at 2:22 PM  Between 7am to 6pm - Pager - [phone]  After 6pm go to [email or web address] - Therapist, nutritionalpassword EPAS [facility]  [facility] [place] [facility]  Office  [phone]  CC: Primary care physician; [facility]

## 2018-02-18 NOTE — Progress Notes (Signed)
MEDICATION RELATED CONSULT NOTE - ELECTROLYTE MANAGEMENT   Pharmacy Consult for Electrolyte Replacement Indication: Hypokalemia  No Known Allergies   Labs: BMET    Component Value Date/Time   NA 148 (H) 02/18/2018 1341   NA 138 08/04/2017 1145   NA 143 12/22/2011 1844   K 3.4 (L) 02/18/2018 2119   K 4.2 12/22/2011 1844   CL 107 02/18/2018 1341   CL 106 12/22/2011 1844   CO2 35 (H) 02/18/2018 1341   CO2 30 12/22/2011 1844   GLUCOSE 188 (H) 02/18/2018 1341   GLUCOSE 93 12/22/2011 1844   BUN 44 (H) 02/18/2018 1341   BUN 12 08/04/2017 1145   BUN 13 12/22/2011 1844   CREATININE 0.86 02/18/2018 1341   CREATININE 1.03 12/22/2011 1844   CALCIUM 7.7 (L) 02/18/2018 1341   CALCIUM 8.5 12/22/2011 1844   GFRNONAA >60 02/18/2018 1341   GFRNONAA >60 12/22/2011 1844   GFRAA >60 02/18/2018 1341   GFRAA >60 12/22/2011 1844    Estimated Creatinine Clearance: 54.1 mL/min (by C-G formula based on SCr of 0.86 mg/dL).  Assessment: Patient is an 84yo male admitted for NSTEMI. Noted to be hypokalemic this morning. Pharmacy consulted for Electrolyte management.  K=3.0, repeat K=2.7 (prior to KCl supplementation), Mag=2.0  Patient is currently on Furosemide 20mg  IV BID  Goal of Therapy:  Maintain K ~ 4.0 and Mag ~ 2.0  Plan:  Will order KCl 40mEq PO q4h x 2. Will recheck K at 21:00 tonight. Follow up on AM labs.  12/6:  K @ 21:00 = 3.4 Will order KCl 10 mEq IV X 2 and recheck electrolytes on 12/7 with AM labs.   Suleyman Ehrman D 02/18/2018 10:21 PM

## 2018-02-18 NOTE — Progress Notes (Signed)
Progress Note  Patient Name: Adam Roberson [facility] Date of Encounter: 02/18/2018  Primary Cardiologist: Adam Roberson  Subjective   Started on Seroquel last night Comfortable this morning, no family at the bedside Periodic elevated heart rate this morning   Inpatient Medications    Scheduled Meds: . aspirin  324 mg Per Tube Daily  . chlorhexidine gluconate (MEDLINE KIT)  15 mL Mouth Rinse BID  . docusate  100 mg Per Tube BID  . feeding supplement (PRO-STAT SUGAR FREE 64)  30 mL Per Tube Daily  . feeding supplement (VITAL 1.5 CAL)  1,000 mL Per Tube Q24H  . furosemide  20 mg Intravenous BID  . mouth rinse  15 mL Mouth Rinse 10 times per day  . metoprolol tartrate  25 mg Oral BID  . multivitamin  15 mL Per Tube Daily  . pantoprazole sodium  40 mg Per Tube Daily  . QUEtiapine  25 mg Per Tube BID   Continuous Infusions: . dexmedetomidine (PRECEDEX) IV infusion Stopped (02/18/18 0742)  . fentaNYL infusion INTRAVENOUS Stopped (02/18/18 0744)  . heparin 1,000 Units/hr (02/18/18 1000)  . norepinephrine (LEVOPHED) Adult infusion    . propofol (DIPRIVAN) infusion Stopped (02/15/18 1150)   PRN Meds: acetaminophen **OR** acetaminophen, metoprolol tartrate, ondansetron **OR** ondansetron (ZOFRAN) IV, sodium chloride flush   Vital Signs    Vitals:   02/18/18 0900 02/18/18 0908 02/18/18 0930 02/18/18 1000  BP: 113/63  (!) 91/58 (!) 96/52  Pulse: 85 92 83 92  Resp: (!) 23  (!) 25 (!) 26  Temp:      TempSrc:      SpO2: 98%  98% 97%  Weight:      Height:        Intake/Output Summary (Last 24 hours) at 02/18/2018 1045 Last data filed at 02/18/2018 1000 Gross per 24 hour  Intake 1655.95 ml  Output 2200 ml  Net -544.05 ml   Filed Weights   02/15/18 0346 02/16/18 0500 02/17/18 0301  Weight: 62 kg 62.4 kg 63.2 kg    Telemetry    Atrial fibrillation, rate up to 120, Higher with agitation  ECG    n/a - Personally Reviewed  Physical Exam   No significant change in  exam Constitutional: Intubated sedated HENT:  Head: Grossly normal Eyes:  no discharge. No scleral icterus.  Neck: Unable to estimate JVD, no carotid bruits  Cardiovascular:  irregularly irregular,no murmurs appreciated Pulmonary/Chest: Clear, scattered Rales Abdominal: Soft.  no distension.  no tenderness.  Musculoskeletal: Grossly nonfocal, no purposeful movement Neurological: Intubated sedated Skin: Skin warm and dry Psychiatric: Intubated sedated   Labs    Chemistry Recent Labs  Lab 03/12/2018 0527 02/14/18 0712  02/15/18 0352 02/15/18 1253 02/16/18 0410 02/18/18 0329  NA 138 139  --  140  --  143 147*  K 3.3* 5.0   < > 3.1* 4.3 3.8 3.0*  CL 100 112*  --  107  --  111 109  CO2 27 21*  --  26  --  28 32  GLUCOSE 172* 107*  --  141*  --  201* 192*  BUN 20 13  --  16  --  27* 40*  CREATININE 0.96 0.77  --  1.09  --  1.00 0.87  CALCIUM 8.7* 7.4*  --  7.0*  --  7.7* 7.7*  PROT 7.7 5.3*  --  5.5*  --   --   --   ALBUMIN 3.6 2.4*  --  2.4*  --   --   --  AST 102* 69*  --  99*  --   --   --   ALT 17 25  --  28  --   --   --   ALKPHOS 86 56  --  59  --   --   --   BILITOT 0.6 1.0  --  0.4  --   --   --   GFRNONAA >60 >60  --  >60  --  >60 >60  GFRAA >60 >60  --  >60  --  >60 >60  ANIONGAP 11 6  --  7  --  4* 6   < > = values in this interval not displayed.     Hematology Recent Labs  Lab 02/16/18 0410 02/17/18 0645 02/18/18 0329  WBC 9.8 9.2 8.9  RBC 4.13* 4.05* 3.93*  HGB 12.1* 11.9* 11.7*  HCT 38.6* 37.1* 36.1*  MCV 93.5 91.6 91.9  MCH 29.3 29.4 29.8  MCHC 31.3 32.1 32.4  RDW 13.5 13.8 13.8  PLT 141* 152 173    Cardiac Enzymes Recent Labs  Lab 03/12/2018 1255 02/26/2018 1628 02/14/18 1802 02/16/18 0410  TROPONINI 19.25* 24.64* 22.15* 17.50*   No results for input(s): TROPIPOC in the last 168 hours.   BNPNo results for input(s): BNP, PROBNP in the last 168 hours.   DDimer No results for input(s): DDIMER in the last 168 hours.   Radiology    Ct  Angio Chest Pe W Or Wo Contrast  Result Date: 03/08/2018 IMPRESSION: Enlarged heart with interstitial pulmonary edema and bilateral pleural effusions. Superimposed patchy airspace consolidation in both lungs, more confluent in the lower lobes, suspicious for multifocal pneumonia. Borderline mediastinal lymphadenopathy, likely reactive. Aortic Atherosclerosis (ICD10-I70.0). Electronically Signed   By: Adam Roberson M.D.   On: 03/02/2018 12:50   Dg Chest Port 1 View  Result Date: 02/15/2018 IMPRESSION: Endotracheal tube at the level of the carina. This could be withdrawn 2-3 cm. Persistent changes of CHF with increasing effusions bilaterally. Bibasilar opacities consistent with atelectasis or infiltrate. Electronically Signed   By: Adam Roberson M.D.   On: 02/15/2018 07:26     Cardiac Studies   Echo 02/17/2018: Study Conclusions  - Left ventricle: The cavity size was normal. There was mild concentric hypertrophy. Systolic function was moderately reduced. The estimated ejection fraction was in the range of 35% to 40%. Akinesis of the mid-apicalanteroseptal, anterior, inferior, inferoseptal, and apical myocardium. - Aortic valve: There was mild regurgitation. - Mitral valve: Calcified annulus. There was moderate regurgitation. - Left atrium: The atrium was mildly dilated. - Tricuspid valve: There was moderate regurgitation. - Pulmonary arteries: Systolic pressure was mildly to moderately increased. PA peak pressure: 44 mm Hg (S).  Impressions:  - Wall motion abnormalities suggestive of stress induced cardiomyopathy vs. LAD infarct.  Patient Profile     84 y.o. male with history of CAD s/p 3-vessel CABG in [place], permanent Afib on Coumadin, HFpEF, CVA, DVT, HTN, HLD dementia, and Parkinson's disease who we are asked to evaluate for elevated troponin in the setting of acute respiratory failure with hypoxia in the setting of suspected ARDS/PNA/acute systolic and acute  on chronic diastolic CHF.   Assessment & Plan    1. NSTEMI: -Troponin peaked at 24.64 on 12/1, they have trended downward Concern for ischemic cardiomyopathy No family to discuss issues, Language barrier as patient does not speak English and is intubated  --- heparin infusion   Decision on arrival made to defer catheterization  Complicating issues include patient's  advanced age and comorbid conditions, dementia, agitation when awake, difficulty extubating, tachycardia with agitation --Would plan on discussion once extubated Would continue heparin infusion  2. Acute systolic CHF/acute on chronic diastolic CHF: EF of 35-70% with WMA Unable to exclude stress-induced cardiomyopathy vs ischemia Medications on hold secondary to hypotension Ischemic work-up after extubation and discussion with patient and family  3. Acute respiratory failure with hypoxia:  PNA and acute systolic CHF FiO2 17% Repeated agitation on weaning of sedation leading to atrial fibrillation with RVR Started on Seroquel  4. Permanent Afib with RVR: Would continue beta-blocker as blood pressure allows -- Seroquel for anxiolytic as sedation weaned Agitation likely contributing to elevated rate up to 140s    Total encounter time more than 25 minutes  Greater than 50% was spent in counseling and coordination of care with the patient   For questions or updates, please contact [facility] Please consult [email or web address] for contact info under Cardiology/STEMI.    Signed, Christell Faith, PA-C [facility] Pager: [phone] 02/18/2018, 10:45 AM

## 2018-02-18 NOTE — Progress Notes (Signed)
ANTICOAGULATION CONSULT NOTE - Initial Consult  Pharmacy Consult for heparin drip Indication: chest pain/ACS  No Known Allergies  Patient Measurements: Height: 5\' 6"  (167.6 cm) Weight: 139 lb 5.3 oz (63.2 kg) IBW/kg (Calculated) : 63.8 Heparin Dosing Weight: 66 kg  Vital Signs: Temp: 98.8 F (37.1 C) (12/06 1230) Temp Source: Oral (12/06 1230) BP: 109/72 (12/06 1500) Pulse Rate: 95 (12/06 1500)  Labs: Recent Labs    02/16/18 0410 02/17/18 0645 02/18/18 0329 02/18/18 1341  HGB 12.1* 11.9* 11.7*  --   HCT 38.6* 37.1* 36.1*  --   PLT 141* 152 173  --   HEPARINUNFRC 0.54 0.43 0.32 0.40  CREATININE 1.00  --  0.87 0.86  TROPONINI 17.50*  --   --   --     Estimated Creatinine Clearance: 54.1 mL/min (by C-G formula based on SCr of 0.86 mg/dL).   Medical History: Past Medical History:  Diagnosis Date  . CAD (coronary artery disease)    a.2003 s/[place] CABG x3 in [place]; b. ? 2014 PCI (records not available).  . Chronic diastolic CHF (congestive heart failure) (HCC)    a. 06/2015 Echo: EF 60-65%, mod dil LA, nl RV, mild to mod TR, PASP 46mmHg.  Marland Kitchen. DVT (deep venous thrombosis) (HCC)   . DVT of leg (deep venous thrombosis) (HCC)   . H/O: CVA (cerebrovascular accident)   . HOH (hard of hearing)   . Hyperlipidemia   . Hypertension   . Hypertensive heart disease   . Parkinson disease (HCC)   . Permanent atrial fibrillation    a. CHA2DS2VASc= 7-->coumadin (followed by primary care).  . Pneumonia    4/17    Medications:  Warfarin as outpatient  Assessment: Trop  26.62   12/6 13:00 Heparin level resulted at 0.40, heparin infusing at 1000 units/hr.  Goal of Therapy:  Heparin level 0.3-0.7 units/ml Monitor platelets by anticoagulation protocol: Yes   Plan:  Will continue with current rate of 1000 units/hr and recheck Heparin level in 8 hours for confirmation. Daily CBC while on Heparin infusion.  Clovia CuffLisa Remee Charley, PharmD, BCPS 02/18/2018 3:37 PM

## 2018-02-18 NOTE — Progress Notes (Signed)
Follow up - Critical Care Medicine Note  Patient Details:    Adam Roberson is an 84 y.o. male.  With a past medical history remarkable for coronary artery disease, hypertension, atrial fibrillation, diastolic heart failure, presented to the emergency department via EMS secondary to aggressive respiratory failure with hypoxemia.  Patient was subsequently intubated and admitted to the intensive care unit.  Was treated for respiratory failure and pneumonia.  Also found to have NSTEMI  Lines, Airways, Drains: Airway (Active)     Airway 8 mm (Active)  Secured at (cm) 22 cm 02/14/2018  8:47 AM  Measured From Lips 02/14/2018  8:47 AM  Secured Location Center 02/14/2018  8:47 AM  Secured By [employer] Tube Holder 02/14/2018  8:47 AM  Tube Holder Repositioned Yes 02/14/2018  2:38 AM  Cuff Pressure (cm H2O) 27 cm H2O 02/14/2018  8:47 AM  Site Condition Dry 02/14/2018  8:47 AM     NG/OG Tube Orogastric 18 Fr. Center mouth Xray;Aucultation Documented cm marking at nare/ corner of mouth 60 cm (Active)  Cm Marking at Nare/Corner of Mouth (if applicable) 60 cm 02/14/2018  4:00 AM  Site Assessment Clean;Dry;Intact 02/14/2018  4:00 AM  Ongoing Placement Verification No change in cm markings or external length of tube from initial placement 02/14/2018  4:00 AM  Status Suction-low intermittent 02/14/2018  4:00 AM  Amount of suction 80 mmHg 02/14/2018  4:00 AM  Drainage Appearance Green 02/14/2018  4:00 AM     Urethral Catheter April RN Straight-tip 16 Fr. (Active)  Indication for Insertion or Continuance of Catheter Unstable critical patients (first 24-48 hours) 02/14/2018  4:00 AM  Site Assessment Clean;Intact 02/14/2018  4:00 AM  Catheter Maintenance Bag below level of bladder;Catheter secured;Drainage bag/tubing not touching floor;Insertion date on drainage bag;No dependent loops;Seal intact 02/14/2018  8:00 AM  Collection Container Standard drainage bag 02/14/2018  4:00 AM  Securement Method Leg strap 02/14/2018   4:00 AM  Urinary Catheter Interventions Unclamped 02/14/2018  4:00 AM  Output (mL) 100 mL 02/14/2018  5:46 AM    Anti-infectives:  Anti-infectives (From admission, onward)   Start     Dose/Rate Route Frequency Ordered Stop   03/06/2018 1800  ceFEPIme (MAXIPIME) 2 g in sodium chloride 0.9 % 100 mL IVPB     2 g 200 mL/hr over 30 Minutes Intravenous Every 12 hours 02/25/2018 0654 02/17/18 1801   03/09/2018 1600  vancomycin (VANCOCIN) IVPB 1000 mg/200 mL premix  Status:  Discontinued     1,000 mg 200 mL/hr over 60 Minutes Intravenous Every 18 hours 02/19/2018 0831 02/14/18 1031   02/24/2018 0600  oseltamivir (TAMIFLU) capsule 75 mg  Status:  Discontinued     75 mg Oral  Once 03/12/2018 0555 03/04/2018 0758   02/25/2018 0530  ceFEPIme (MAXIPIME) 2 g in sodium chloride 0.9 % 100 mL IVPB     2 g 200 mL/hr over 30 Minutes Intravenous  Once 02/19/2018 0524 02/19/2018 0636   02/21/2018 0530  metroNIDAZOLE (FLAGYL) IVPB 500 mg  Status:  Discontinued     500 mg 100 mL/hr over 60 Minutes Intravenous Every 8 hours 03/13/2018 0524 03/12/2018 1109   03/08/2018 0530  vancomycin (VANCOCIN) IVPB 1000 mg/200 mL premix     1,000 mg 200 mL/hr over 60 Minutes Intravenous  Once 03/04/2018 0524 02/25/2018 0859      Microbiology: Results for orders placed or performed during the hospital encounter of 03/01/2018  Blood Culture (routine x 2)     Status: None   Collection  Time: 03/12/2018  5:27 AM  Result Value Ref Range Status   Specimen Description BLOOD RIGHT HAND  Final   Special Requests   Final    BOTTLES DRAWN AEROBIC AND ANAEROBIC Blood Culture adequate volume   Culture   Final    NO GROWTH 5 DAYS Performed at [facility], [address], [place], [place] [address]    Report Status 02/18/2018 FINAL  Final  Blood Culture (routine x 2)     Status: None   Collection Time: 02/14/2018  5:27 AM  Result Value Ref Range Status   Specimen Description BLOOD LEFT FOREARM  Final   Special Requests   Final    BOTTLES DRAWN AEROBIC  AND ANAEROBIC Blood Culture adequate volume   Culture   Final    NO GROWTH 5 DAYS Performed at [facility], [address], [place], [place] [address]    Report Status 02/18/2018 FINAL  Final  Urine culture     Status: None   Collection Time: 02/14/2018  5:27 AM  Result Value Ref Range Status   Specimen Description   Final    URINE, RANDOM Performed at [facility], [address], [place], [place] [address]    Special Requests   Final    NONE Performed at [facility], [address], [place], [place] [address]    Culture   Final    NO GROWTH Performed at [facility], [address] [place], [place] [address]    Report Status 02/14/2018 FINAL  Final  MRSA PCR Screening     Status: None   Collection Time: 03/01/2018  8:32 AM  Result Value Ref Range Status   MRSA by PCR NEGATIVE NEGATIVE Final    Comment:        The GeneXpert MRSA Assay (FDA approved for NASAL specimens only), is one component of a comprehensive MRSA colonization surveillance program. It is not intended to diagnose MRSA infection nor to guide or monitor treatment for MRSA infections. Performed at [facility], [address] [place], [place] [address]   Studies: Ct Angio Chest Pe W Or Wo Contrast  Result Date: 02/25/2018 CLINICAL DATA:  Decreased oxygen saturation requiring intubation. EXAM: CT ANGIOGRAPHY CHEST WITH CONTRAST TECHNIQUE: Multidetector CT imaging of the chest was performed using the standard protocol during bolus administration of intravenous contrast. Multiplanar CT image reconstructions and MIPs were obtained to evaluate the vascular anatomy. CONTRAST:  75mL OMNIPAQUE IOHEXOL 350 MG/ML SOLN COMPARISON:  Chest radiograph 02/23/2018 FINDINGS: Cardiovascular: Satisfactory opacification of the pulmonary arteries to the segmental level. No evidence of pulmonary embolism. Enlarged heart. No pericardial effusion. Calcific atherosclerotic disease of the  coronary arteries and aorta. Annular calcifications of the mitral valve. Mediastinum/Nodes: Borderline enlarged bilateral mediastinal lymph nodes. The patient is intubated. Main bronchi are patent. Enteric catheter within the esophagus, terminates in the distal gastric body. Lungs/Pleura: Moderate right and small left pleural effusions. Mild interstitial pulmonary edema. Scattered ground-glass opacities throughout both lungs, with more confluent appearance in the lower lobes, left greater than right. Upper Abdomen: Right renal cyst. 18 mm hypoattenuated nodule within the spleen, likely benign. Musculoskeletal: Findings of diffuse idiopathic skeletal hyperostosis of the thoracic spine Review of the MIP images confirms the above findings. IMPRESSION: Enlarged heart with interstitial pulmonary edema and bilateral pleural effusions. Superimposed patchy airspace consolidation in both lungs, more confluent in the lower lobes, suspicious for multifocal pneumonia. Borderline mediastinal lymphadenopathy, likely reactive. Aortic Atherosclerosis (ICD10-I70.0). Electronically Signed  By: Ted Mcalpine M.D.   On: 03/06/2018 12:50   Dg Chest Port 1 View  Result Date: 02/17/2018 CLINICAL DATA:  Mechanically assisted ventilation. EXAM: PORTABLE CHEST 1 VIEW COMPARISON:  02/16/2018. FINDINGS: Endotracheal tube again terminates approximately 5 mm above the carina. Proximal repositioning of approximately 2-3 cm again suggested. Interim placement of right PICC line, its tip is over the right atrium. NG tube noted with tip below left hemidiaphragm. Prior CABG. Cardiomegaly with bilateral pulmonary infiltrates/edema again noted. Small bilateral pleural effusions again noted. Findings suggest congestive heart failure. No pneumothorax. IMPRESSION: 1. Endotracheal tube tip again noted 5 mm above the carina, proximal repositioning of approximately 2 3 cm again suggested. 2. Interim placement of PICC line. Its tip is over the right  atrium. 3. Prior CABG. Findings consistent with congestive heart failure bilateral pulmonary edema and bilateral pleural effusions again noted. These results will be called to the ordering clinician or representative by the Radiologist Assistant, and communication documented in the PACS or zVision Dashboard. Electronically Signed   By: Maisie Fus  Register   On: 02/17/2018 13:00   Dg Chest Port 1 View  Result Date: 02/16/2018 CLINICAL DATA:  Respiratory distress.  Patient on ventilation. EXAM: PORTABLE CHEST 1 VIEW COMPARISON:  February 15, 2018 FINDINGS: The ETT terminates slightly above the carina, 5 mm. An NG tube terminates below today's film. A right PICC line terminates in the SVC. Diffuse interstitial opacities. Bilateral effusions. More focal infiltrate in the left base. Stable cardiomediastinal silhouette. IMPRESSION: 1. The ETT terminates slightly above the carina, 5 mm. Recommend withdrawing 2 cm. 2. Other support apparatus as above. 3. Diffuse interstitial opacities and bilateral effusions. 4. More focal infiltrate in the left base. These results will be called to the ordering clinician or representative by the Radiologist Assistant, and communication documented in the PACS or zVision Dashboard. Electronically Signed   By: Gerome Sam III M.D   On: 02/16/2018 14:22   Dg Chest Port 1 View  Result Date: 02/15/2018 CLINICAL DATA:  PICC Line placement EXAM: PORTABLE CHEST 1 VIEW COMPARISON:  02/15/2018 FINDINGS: Interval placement of RIGHT-sided PICC line, tip overlying the superior vena cava. Endotracheal tube is in place with tip approximately 1 centimeter above the carina. A nasogastric tube is in place, tip off the image beyond the gastroesophageal junction. The heart is enlarged and stable in configuration. There are patchy infiltrates in the lungs bilaterally. Bilateral pleural effusions are stable. IMPRESSION: Interval placement of RIGHT-sided PICC line, tip to the superior vena cava. Stable  bilateral pleural effusions and parenchymal opacities. Electronically Signed   By: Norva Pavlov M.D.   On: 02/15/2018 09:40   Dg Chest Port 1 View  Result Date: 02/15/2018 CLINICAL DATA:  Acute respiratory failure EXAM: PORTABLE CHEST 1 VIEW COMPARISON:  02/15/2018 FINDINGS: Cardiac shadow is stable. Endotracheal tube is noted at the level of the carina. This could be withdrawn 2-3 cm. Nasogastric catheter extends into the stomach. Vascular congestion and increasing pleural effusions are seen. Bibasilar atelectasis/infiltrate is noted as well. IMPRESSION: Endotracheal tube at the level of the carina. This could be withdrawn 2-3 cm. Persistent changes of CHF with increasing effusions bilaterally. Bibasilar opacities consistent with atelectasis or infiltrate. Electronically Signed   By: Alcide Clever M.D.   On: 02/15/2018 07:26   Dg Chest Port 1 View  Result Date: 03/14/2018 CLINICAL DATA:  Initial evaluation for sepsis, post intubation. EXAM: PORTABLE CHEST 1 VIEW COMPARISON:  Prior radiograph from 08/17/2017 FINDINGS: Endotracheal tube in place with  tip position approximately 15 mm above the carina. Enteric tube courses into the abdomen with side hole beyond the GE junction. Median sternotomy wires underlying surgical clips noted, stable. Cardiomegaly unchanged. Mediastinal silhouette normal. Aortic atherosclerosis. Lungs are mildly hypoinflated. Diffuse pulmonary vascular and interstitial congestion, suggesting pulmonary interstitial edema. There are superimposed confluent multifocal opacities at the right mid and lower lung as well as the left lung base, suspicious for infiltrates. Associated small right pleural effusion. No pneumothorax. No acute osseus abnormality. IMPRESSION: 1. Tip of the endotracheal tube approximately 15 mm above the carina. 2. Cardiomegaly with diffuse pulmonary vascular and interstitial congestion, suggesting pulmonary interstitial edema. 3. Superimposed multifocal opacities at  the right mid and lower lung and left lung base, suspicious for infiltrates. 4. Small right pleural effusion. Electronically Signed   By: Rise Mu M.D.   On: 12-Mar-2018 05:57   [place] Ekg Site Rite  Result Date: 02/14/2018 If Site Rite image not attached, placement could not be confirmed due to current cardiac rhythm.   Consults: Treatment Team:  [place] Ouch, MD   Subjective:    Overnight Issues: Patient intubated, sedated with atrial fibrillation.  Patient failed spontaneous breathing trial again yesterday.  Has increased ventricular response and his atrial fibrillation during breathing trials  Objective:  Vital signs for last 24 hours: Temp:  [98.3 F (36.8 C)-99.3 F (37.4 C)] 98.9 F (37.2 C) (12/06 0708) Pulse Rate:  [35-107] 92 (12/06 0908) Resp:  [20-46] 23 (12/06 0900) BP: (83-143)/(52-90) 113/63 (12/06 0900) SpO2:  [97 %-100 %] 98 % (12/06 0900) FiO2 (%):  [28 %] 28 % (12/06 0730)  Intake/Output from previous day: 12/05 0701 - 12/06 0700 In: 1622.6 [I.V.:449.8; NG/GT:1072.9; IV Piggyback:99.9] Out: 2200 [Urine:2200]  Intake/Output this shift: Total I/O In: 104.4 [I.V.:24.4; NG/GT:80] Out: -   Vent settings for last 24 hours: Vent Mode: PRVC FiO2 (%):  [28 %] 28 % Set Rate:  [16 bmp] 16 bmp Vt Set:  [400 mL] 400 mL PEEP:  [5 cmH20] 5 cmH20 Plateau Pressure:  [21 cmH20-24 cmH20] 21 cmH20  Physical Exam:  Vital signs: Please see the above listed vital signs HEENT: Patient is orally intubated, trachea midline, no thyromegaly appreciated Cardiovascular: Irregularly iregular rhythm with rapid ventricular response Pulmonary: Coarse rhonchi and rales appreciated diffusely Abdominal: Positive bowel sounds, soft exam Extremities: No clubbing, cyanosis or edema noted Neurologic: Limited exam as patient is on mechanical ventilation  Assessment/Plan:   Hypoxemic respiratory failure.  Multifactorial etiology to include decompensated heart failure,  bilateral effusions and interstitial edema noted on CT scan and chest x-ray.  Patient diuresed approximately 1 L yesterday, has completed a course of antibiotics and cefepime has been stopped  NSTEMI.  Troponin decreasing to 17.5, being followed by cardiology, on aspirin and heparin.  No ischemic changes appreciated on EKG. echocardiogram performed revealed reduced ejection fraction at 35 to 40% with significant hypokinesia consistent with stress-induced cardiomyopathy versus LAD infarction.  Additional medicines have been limited secondary to marginal blood pressure  Atrial fibrillation.  Presently on Lopressor  Prerenal azotemia.  Being BMP  Hyperglycemia.  Scale coverage  We will try again spontaneous awakening and breathing trial this morning We will have family meeting today to discuss goals of care  Critical  care time 35 minutes  Tora Kindred, DO Critical Care Total Time. 40 minutes  Jobe Mutch 02/18/2018  *Care during the described time interval was provided by me and/or other providers on the critical care team.  I have reviewed this patient's  available data, including medical history, events of note, physical examination and test results as part of my evaluation. Patient ID: Rajon Bisig, male   DOB: 08-Feb-1929, 84 y.o.   MRN: [id number] Patient ID: Jovann Luse, male   DOB: [birth year], 84 y.o.   MRN: [id number] Patient ID: Edge Mauger, male   DOB: 1929-01-09, 84 y.o.   MRN: [id number] Patient ID: Cloys Vera, male   DOB: 1928-11-14, 84 y.o.   MRN: [id number]

## 2018-02-18 NOTE — Progress Notes (Signed)
ANTICOAGULATION CONSULT NOTE - Initial Consult  Pharmacy Consult for heparin drip Indication: chest pain/ACS  No Known Allergies  Patient Measurements: Height: 5\' 6"  (167.6 cm) Weight: 139 lb 5.3 oz (63.2 kg) IBW/kg (Calculated) : 63.8 Heparin Dosing Weight: 66 kg  Vital Signs: Temp: 100.1 F (37.8 C) (12/06 1916) Temp Source: Oral (12/06 1916) BP: 98/65 (12/06 2123) Pulse Rate: 92 (12/06 2123)  Labs: Recent Labs    02/16/18 0410 02/17/18 0645 02/18/18 0329 02/18/18 1341 02/18/18 2119  HGB 12.1* 11.9* 11.7*  --   --   HCT 38.6* 37.1* 36.1*  --   --   PLT 141* 152 173  --   --   HEPARINUNFRC 0.54 0.43 0.32 0.40 0.45  CREATININE 1.00  --  0.87 0.86  --   TROPONINI 17.50*  --   --   --   --     Estimated Creatinine Clearance: 54.1 mL/min (by C-G formula based on SCr of 0.86 mg/dL).   Medical History: Past Medical History:  Diagnosis Date  . CAD (coroEarlene PlaterEEarlene PlaterCliEmory Johns Creek HospiKentuckytalCatering manager478e Rings PlClinical research assoDominSpringhill Surgery [id number] (cerebrovascular accident)   . HOH (hard of hearing)   . Hyperlipidemia   . Hypertension   . Hypertensive heart disease   . Parkinson disease (HCC)   . Permanent atrial fibrillation    a. CHA2DS2VASc= 7-->coumadin (followed by primary care).  . Pneumonia    4/17    Medications:  Warfarin as outpatient  Assessment: Trop  26.62   12/6 13:00 Heparin level resulted at 0.40, heparin infusing at 1000 units/hr.  Goal of Therapy:  Heparin level 0.3-0.7 units/ml Monitor platelets by anticoagulation protocol: Yes   Plan:  Will continue with current rate of 1000 units/hr and recheck Heparin level in 8 hours for confirmation. Daily CBC while on Heparin infusion.  12/6:  HL @ 21:00 = 0.45 Will  continue this pt on current rate and recheck HL on 12/7 with AM labs.   [place] Devine D 02/18/2018 10:17 PM

## 2018-02-18 NOTE — Progress Notes (Signed)
ANTICOAGULATION CONSULT NOTE - Initial Consult  Pharmacy Consult for heparin drip Indication: chest pain/ACS  No Known Allergies  Patient Measurements: Height: 5\' 6"  (167.6 cm) Weight: 139 lb 5.3 oz (63.2 kg) IBW/kg (Calculated) : 63.8 Heparin Dosing Weight: 66 kg  Vital Signs: Temp: 99 F (37.2 C) (12/05 2000) Temp Source: Oral (12/05 2000) BP: 87/53 (12/06 0300) Pulse Rate: 69 (12/06 0351)  Labs: Recent Labs    02/16/18 0410 02/17/18 0645 02/18/18 0329  HGB 12.1* 11.9* 11.7*  HCT 38.6* 37.1* 36.1*  PLT 141* 152 173  HEPARINUNFRC 0.54 0.43 0.32  CREATININE 1.00  --   --   TROPONINI 17.50*  --   --     Estimated Creatinine Clearance: 46.5 mL/min (by C-G formula based on SCr of 1 mg/dL).   Medical History: Past Medical History:  Diagnosis Date  . CAD (coronary artery disease)    a.2003 [place] CAB[place] x3 in [place]; b. ? 2014 PCI (records not available).  . Chronic diastolic CHF (congestive heart failure) (HCC)    a. 06/2015 Echo: EF 60-65%, mod dil LA, nl RV, mild to mod TR, PASP 46mmHg.  Marland Kitchen. DVT (deep venous thrombosis) (HCC)   . DVT of leg (deep venous thrombosis) (HCC)   . H/O: CVA (cerebrovascular accident)   . HOH (hard of hearing)   . Hyperlipidemia   . Hypertension   . Hypertensive heart disease   . Parkinson disease (HCC)   . Permanent atrial fibrillation    a. CHA2DS2VASc= 7-->coumadin (followed by primary care).  . Pneumonia    4/17    Medications:  Warfarin as outpatient  Assessment: Trop  26.62   Goal of Therapy:  Heparin level 0.3-0.7 units/ml Monitor platelets by anticoagulation protocol: Yes   Plan:  12/06 @ 0330 HL 0.32 therapeutic, but trending down. Will increase rate slightly to 1000 units/hr and will recheck HL @ 1300. CBC stable will continue to monitor.  Thomasene Rippleavid Arthella Headings, PharmD, BCPS Clinical Pharmacist 02/18/2018

## 2018-02-18 NOTE — Progress Notes (Signed)
MEDICATION RELATED CONSULT NOTE - ELECTROLYTE MANAGEMENT   Pharmacy Consult for Electrolyte Replacement Indication: Hypokalemia  No Known Allergies   Labs: BMET    Component Value Date/Time   NA 148 (H) 02/18/2018 1341   NA 138 08/04/2017 1145   NA 143 12/22/2011 1844   K 2.7 (LL) 02/18/2018 1341   K 4.2 12/22/2011 1844   CL 107 02/18/2018 1341   CL 106 12/22/2011 1844   CO2 35 (H) 02/18/2018 1341   CO2 30 12/22/2011 1844   GLUCOSE 188 (H) 02/18/2018 1341   GLUCOSE 93 12/22/2011 1844   BUN 44 (H) 02/18/2018 1341   BUN 12 08/04/2017 1145   BUN 13 12/22/2011 1844   CREATININE 0.86 02/18/2018 1341   CREATININE 1.03 12/22/2011 1844   CALCIUM 7.7 (L) 02/18/2018 1341   CALCIUM 8.5 12/22/2011 1844   GFRNONAA >60 02/18/2018 1341   GFRNONAA >60 12/22/2011 1844   GFRAA >60 02/18/2018 1341   GFRAA >60 12/22/2011 1844    Estimated Creatinine Clearance: 54.1 mL/min (by C-G formula based on SCr of 0.86 mg/dL).  Assessment: Patient is an 84yo male admitted for NSTEMI. Noted to be hypokalemic this morning. Pharmacy consulted for Electrolyte management.  K=3.0, repeat K=2.7 (prior to KCl supplementation), Mag=2.0  Patient is currently on Furosemide 20mg  IV BID  Goal of Therapy:  Maintain K ~ 4.0 and Mag ~ 2.0  Plan:  Will order KCl 40mEq PO q4h x 2. Will recheck K at 21:00 tonight. Follow up on AM labs.  Clovia CuffLisa Tayte Childers, PharmD, BCPS 02/18/2018 3:45 PM

## 2018-02-19 ENCOUNTER — Inpatient Hospital Stay: Payer: Medicaid Other

## 2018-02-19 LAB — HEPARIN LEVEL (UNFRACTIONATED): Heparin Unfractionated: 0.37 IU/mL (ref 0.30–0.70)

## 2018-02-19 LAB — GLUCOSE, CAPILLARY
Glucose-Capillary: 154 mg/dL — ABNORMAL HIGH (ref 70–99)
Glucose-Capillary: 158 mg/dL — ABNORMAL HIGH (ref 70–99)
Glucose-Capillary: 161 mg/dL — ABNORMAL HIGH (ref 70–99)
Glucose-Capillary: 168 mg/dL — ABNORMAL HIGH (ref 70–99)
Glucose-Capillary: 173 mg/dL — ABNORMAL HIGH (ref 70–99)
Glucose-Capillary: 179 mg/dL — ABNORMAL HIGH (ref 70–99)
Glucose-Capillary: 192 mg/dL — ABNORMAL HIGH (ref 70–99)

## 2018-02-19 LAB — CBC
HCT: 39.2 % (ref 39.0–52.0)
Hemoglobin: 12.2 g/dL — ABNORMAL LOW (ref 13.0–17.0)
MCH: 29.5 pg (ref 26.0–34.0)
MCHC: 31.1 g/dL (ref 30.0–36.0)
MCV: 94.7 fL (ref 80.0–100.0)
Platelets: 160 10*3/uL (ref 150–400)
RBC: 4.14 MIL/uL — AB (ref 4.22–5.81)
RDW: 14.2 % (ref 11.5–15.5)
WBC: 10.5 10*3/uL (ref 4.0–10.5)
nRBC: 0 % (ref 0.0–0.2)

## 2018-02-19 LAB — BASIC METABOLIC PANEL
Anion gap: 7 (ref 5–15)
BUN: 40 mg/dL — ABNORMAL HIGH (ref 8–23)
CO2: 33 mmol/L — ABNORMAL HIGH (ref 22–32)
Calcium: 7.7 mg/dL — ABNORMAL LOW (ref 8.9–10.3)
Chloride: 111 mmol/L (ref 98–111)
Creatinine, Ser: 0.83 mg/dL (ref 0.61–1.24)
GFR calc Af Amer: >60 mL/min (ref >60)
GFR calc non Af Amer: >60 mL/min (ref >60)
Glucose, Bld: 196 mg/dL — ABNORMAL HIGH (ref 70–99)
POTASSIUM: 3.8 mmol/L (ref 3.5–5.1)
Sodium: 151 mmol/L — ABNORMAL HIGH (ref 135–145)

## 2018-02-19 LAB — MAGNESIUM: Magnesium: 2.2 mg/dL (ref 1.7–2.4)

## 2018-02-19 MED ORDER — FENTANYL CITRATE (PF) 100 MCG/2ML IJ SOLN: 50.0000 ug | INTRAMUSCULAR | Status: DC | PRN

## 2018-02-19 MED ORDER — FENTANYL BOLUS VIA INFUSION
50.0000 ug | INTRAVENOUS | Status: DC | PRN
  Administered 2018-02-19: 100 ug via INTRAVENOUS
  Filled 2018-02-19: qty 100

## 2018-02-19 MED ORDER — SODIUM CHLORIDE 0.9 % IV SOLN
0.0000 ug/min | INTRAVENOUS | Status: DC
  Filled 2018-02-19: qty 1

## 2018-02-19 MED ORDER — POTASSIUM CHLORIDE 20 MEQ/15ML (10%) PO SOLN
40.0000 meq | Freq: Once | ORAL | Status: AC
  Administered 2018-02-19: 40 meq
  Filled 2018-02-19: qty 30

## 2018-02-19 MED ORDER — DIGOXIN 125 MCG PO TABS
0.1250 mg | ORAL_TABLET | Freq: Every day | ORAL | Status: DC
  Administered 2018-02-19 – 2018-02-22 (×4): 0.125 mg via NASOGASTRIC
  Filled 2018-02-19 (×5): qty 1

## 2018-02-19 MED ORDER — DEXTROSE 5 % IV SOLN
INTRAVENOUS | Status: DC
  Administered 2018-02-19: 10:00:00 via INTRAVENOUS

## 2018-02-19 MED ORDER — ASPIRIN 81 MG PO CHEW
81.0000 mg | CHEWABLE_TABLET | Freq: Once | ORAL | Status: AC
  Administered 2018-02-19: 81 mg via NASOGASTRIC
  Filled 2018-02-19: qty 1

## 2018-02-19 NOTE — Progress Notes (Signed)
ANTICOAGULATION CONSULT NOTE - Initial Consult  Pharmacy Consult for heparin drip Indication: chest pain/ACS  No Known Allergies  Patient Measurements: Height: 5\' 6"  (167.6 cm) Weight: 143 lb 1.3 oz (64.9 kg) IBW/kg (Calculated) : 63.8 Heparin Dosing Weight: 66 kg  Vital Signs: Temp: 100.3 F (37.9 C) (12/07 0400) Temp Source: Axillary (12/07 0400) BP: 115/70 (12/07 0600) Pulse Rate: 88 (12/07 0600)  Labs: Recent Labs    02/17/18 0645 02/18/18 0329 02/18/18 1341 02/18/18 2119 02/19/18 0516  HGB 11.9* 11.7*  --   --  12.2*  HCT 37.1* 36.1*  --   --  39.2  PLT 152 173  --   --  160  HEPARINUNFRC 0.43 0.32 0.40 0.45 0.37  CREATININE  --  0.87 0.86  --  0.83    Estimated Creatinine Clearance: 56.6 mL/min (by C-G formula based on SCr of 0.83 mg/dL).   Medical History: Past Medical History:  Diagnosis Date  . CAD (coronary artery disease)    a.2003 s/p CABG x3 in [place]; b. ? 2014 PCI (records not available).  . Chronic diastolic CHF (congestive heart failure) (HCC)    a. 06/2015 Echo: EF 60-65%, mod dil LA, nl RV, mild to mod TR, PASP 46mmHg.  Marland Kitchen. DVT (deep venous thrombosis) (HCC)   . DVT of leg (deep venous thrombosis) (HCC)   . H/O: CVA (cerebrovascular accident)   . HOH (hard of hearing)   . Hyperlipidemia   . Hypertension   . Hypertensive heart disease   . Parkinson disease (HCC)   . Permanent atrial fibrillation    a. CHA2DS2VASc= 7-->coumadin (followed by primary care).  . Pneumonia    4/17    Medications:  Warfarin as outpatient  Assessment: Trop  26.62   12/6 13:00 Heparin level resulted at 0.40, heparin infusing at 1000 units/hr.  Goal of Therapy:  Heparin level 0.3-0.7 units/ml Monitor platelets by anticoagulation protocol: Yes   Plan:  12/07 @ 0500 HL 0.37 therapeutic. Will continue current rate and will recheck HL w/ am labs.  Thomasene Rippleavid Teneshia Hedeen, PharmD, BCPS Clinical Pharmacist 02/19/2018

## 2018-02-19 NOTE — Progress Notes (Signed)
MEDICATION RELATED CONSULT NOTE - ELECTROLYTE MANAGEMENT   Pharmacy Consult for Electrolyte Replacement Indication: Hypokalemia  No Known Allergies   Labs: BMET    Component Value Date/Time   NA 151 (H) 02/19/2018 0516   NA 138 08/04/2017 1145   NA 143 12/22/2011 1844   K 3.8 02/19/2018 0516   K 4.2 12/22/2011 1844   CL 111 02/19/2018 0516   CL 106 12/22/2011 1844   CO2 33 (H) 02/19/2018 0516   CO2 30 12/22/2011 1844   GLUCOSE 196 (H) 02/19/2018 0516   GLUCOSE 93 12/22/2011 1844   BUN 40 (H) 02/19/2018 0516   BUN 12 08/04/2017 1145   BUN 13 12/22/2011 1844   CREATININE 0.83 02/19/2018 0516   CREATININE 1.03 12/22/2011 1844   CALCIUM 7.7 (L) 02/19/2018 0516   CALCIUM 8.5 12/22/2011 1844   GFRNONAA >60 02/19/2018 0516   GFRNONAA >60 12/22/2011 1844   GFRAA >60 02/19/2018 0516   GFRAA >60 12/22/2011 1844    Estimated Creatinine Clearance: 56.6 mL/min (by C-G formula based on SCr of 0.83 mg/dL).   Assessment: Patient is an 84 yo male admitted for NSTEMI. Noted to be hypokalemic 12/6 morning. Pharmacy consulted for Electrolyte management.  12/6 K=3.0, repeat K=2.7 (prior to KCl supplementation), Mag=2.0 - Will order KCl 40mEq PO q4h x 2. Will recheck K at 21:00 tonight.   12/6:  K @ 21:00 = 3.4 - Will order KCl 10 mEq IV X 2 and recheck electrolytes on 12/7 with AM labs.   Patient is currently on Furosemide 20 mg IV BID  Goal of Therapy:  Maintain K ~ 4.0 and Mag ~ 2.0  Plan:  12/7 K 3.8, Mag 2.2 - lasix IV d/c'd today, no supplementation today Will recheck labs in AM   Crist FatWang, Marybell Robards L 02/19/2018 9:41 AM

## 2018-02-19 NOTE — Progress Notes (Signed)
Progress Note  Patient Name: Adam Roberson Date of Encounter: 02/19/2018  Primary Cardiologist: Kathlyn Sacramento, MD  Subjective   Intubated and sedated.  Family member at bedside.  Inpatient Medications    Scheduled Meds: . aspirin  324 mg Per Tube Daily  . chlorhexidine gluconate (MEDLINE KIT)  15 mL Mouth Rinse BID  . docusate  100 mg Per Tube BID  . feeding supplement (PRO-STAT SUGAR FREE 64)  30 mL Per Tube Daily  . feeding supplement (VITAL 1.5 CAL)  1,000 mL Per Tube Q24H  . insulin aspart  0-9 Units Subcutaneous Q4H  . mouth rinse  15 mL Mouth Rinse 10 times per day  . metoprolol tartrate  25 mg Oral BID  . multivitamin  15 mL Per Tube Daily  . pantoprazole sodium  40 mg Per Tube Daily  . QUEtiapine  25 mg Per Tube BID   Continuous Infusions: . dexmedetomidine (PRECEDEX) IV infusion 0.4 mcg/kg/hr (02/19/18 0800)  . dextrose 50 mL/hr at 02/19/18 1021  . fentaNYL infusion INTRAVENOUS Stopped (02/19/18 0204)  . heparin 1,000 Units/hr (02/19/18 1124)  . norepinephrine (LEVOPHED) Adult infusion    . propofol (DIPRIVAN) infusion Stopped (02/15/18 1150)   PRN Meds: acetaminophen **OR** acetaminophen, metoprolol tartrate, ondansetron **OR** ondansetron (ZOFRAN) IV, sodium chloride flush   Vital Signs    Vitals:   02/19/18 0800 02/19/18 0900 02/19/18 0956 02/19/18 1000  BP: 130/77 (!) 135/97 (!) 147/90 (!) 153/96  Pulse: 99 98 (!) 134 (!) 139  Resp: (!) 27 (!) 26  (!) 32  Temp: 99.2 F (37.3 C)     TempSrc: Axillary     SpO2: 99% 99%  98%  Weight:      Height:        Intake/Output Summary (Last 24 hours) at 02/19/2018 1226 Last data filed at 02/19/2018 0905 Gross per 24 hour  Intake 854.49 ml  Output 2220 ml  Net -1365.51 ml   Filed Weights   02/16/18 0500 02/17/18 0301 02/19/18 0500  Weight: 62.4 kg 63.2 kg 64.9 kg    Telemetry    Atrial fibrillation.  Heart rates typically in the 90 to 110 bpm range but occasional spikes up to 150 bpm -  Personally Reviewed  ECG    No new tracing  Physical Exam   GEN:  Intubated and sedated. Neck:  Unable to assess JVP due to positioning and support devices. Cardiac: Irregularly irregular doubt murmurs. Respiratory:  Clear anteriorly. GI: Soft, nontender, non-distended  MS: No edema; No deformity. Neuro:   Intubated and sedated. Psych: Intubated and sedated.  Labs    Chemistry Recent Labs  Lab 02/20/2018 0527 02/14/18 0086  02/15/18 0352  02/18/18 0329 02/18/18 1341 02/18/18 2119 02/19/18 0516  NA 138 139  --  140   < > 147* 148*  --  151*  K 3.3* 5.0   < > 3.1*   < > 3.0* 2.7* 3.4* 3.8  CL 100 112*  --  107   < > 109 107  --  111  CO2 27 21*  --  26   < > 32 35*  --  33*  GLUCOSE 172* 107*  --  141*   < > 192* 188*  --  196*  BUN 20 13  --  16   < > 40* 44*  --  40*  CREATININE 0.96 0.77  --  1.09   < > 0.87 0.86  --  0.83  CALCIUM 8.7* 7.4*  --  7.0*   < > 7.7* 7.7*  --  7.7*  PROT 7.7 5.3*  --  5.5*  --   --   --   --   --   ALBUMIN 3.6 2.4*  --  2.4*  --   --   --   --   --   AST 102* 69*  --  99*  --   --   --   --   --   ALT 17 25  --  28  --   --   --   --   --   ALKPHOS 86 56  --  59  --   --   --   --   --   BILITOT 0.6 1.0  --  0.4  --   --   --   --   --   GFRNONAA >60 >60  --  >60   < > >60 >60  --  >60  GFRAA >60 >60  --  >60   < > >60 >60  --  >60  ANIONGAP 11 6  --  7   < > 6 6  --  7   < > = values in this interval not displayed.     Hematology Recent Labs  Lab 02/17/18 0645 02/18/18 0329 02/19/18 0516  WBC 9.2 8.9 10.5  RBC 4.05* 3.93* 4.14*  HGB 11.9* 11.7* 12.2*  HCT 37.1* 36.1* 39.2  MCV 91.6 91.9 94.7  MCH 29.4 29.8 29.5  MCHC 32.1 32.4 31.1  RDW 13.8 13.8 14.2  PLT 152 173 160    Cardiac Enzymes Recent Labs  Lab 03/13/2018 1255 02/20/2018 1628 02/14/18 1802 02/16/18 0410  TROPONINI 19.25* 24.64* 22.15* 17.50*   No results for input(s): TROPIPOC in the last 168 hours.   BNPNo results for input(s): BNP, PROBNP in the last 168  hours.   DDimer No results for input(s): DDIMER in the last 168 hours.   Radiology    Dg Abd 1 View  Result Date: 02/19/2018 CLINICAL DATA:  Aspirated gastric contents. EXAM: ABDOMEN - 1 VIEW COMPARISON:  Chest x-ray dated February 13, 2018. FINDINGS: NG tube tip in the distal stomach. Nonobstructive bowel gas pattern. No acute osseous abnormality. IMPRESSION: 1. NG tube in the stomach.  No acute findings. Electronically Signed   By: Titus Dubin M.D.   On: 02/19/2018 09:24   Dg Chest Port 1 View  Result Date: 02/19/2018 CLINICAL DATA:  Acute respiratory failure. EXAM: PORTABLE CHEST 1 VIEW COMPARISON:  Chest x-ray dated February 18, 2018. FINDINGS: Unchanged endotracheal and enteric tubes. Unchanged right upper extremity PICC line. Stable cardiomediastinal silhouette and diffusely increased interstitial markings. Unchanged small bilateral pleural effusions and bibasilar atelectasis. No pneumothorax. No acute osseous abnormality. IMPRESSION: Stable pulmonary edema and small bilateral pleural effusions. Electronically Signed   By: Titus Dubin M.D.   On: 02/19/2018 09:21   Dg Chest Port 1 View  Result Date: 02/19/2018 CLINICAL DATA:  Aspiration. EXAM: PORTABLE CHEST 1 VIEW COMPARISON:  02/19/2018 at 0545 hours FINDINGS: 0757 hours. Endotracheal tube terminates 2.6 cm above carina.Nasogastric tube extends beyond the inferior aspect of the film. Right-sided PICC line tip at low SVC. Normal heart size for level of inspiration. Atherosclerosis in the transverse aorta. Probable small bilateral pleural effusions. No pneumothorax. Moderate interstitial thickening is similar. Left greater than right base airspace disease. IMPRESSION: No significant change since earlier in the day. Bibasilar airspace disease, which could represent infection or aspiration. Concurrent interstitial  thickening and small bilateral pleural effusions, suggesting congestive heart failure. Aortic Atherosclerosis (ICD10-I70.0).  Electronically Signed   By: Abigail Miyamoto M.D.   On: 02/19/2018 08:37   Dg Chest Port 1 View  Result Date: 02/18/2018 CLINICAL DATA:  Respiratory failure EXAM: PORTABLE CHEST 1 VIEW COMPARISON:  02/17/2018 and prior studies FINDINGS: An endotracheal tube with tip 2 cm above the carina, NG tube entering the stomach with tip off the field of view and RIGHT PICC line with tip overlying the SUPERIOR cavoatrial junction again noted. Cardiomediastinal silhouette is unchanged. CABG changes again identified. Bilateral interstitial opacities/edema again identified with bilateral pleural effusions. No pneumothorax. IMPRESSION: Unchanged appearance of the chest with pulmonary edema and pleural effusions. Electronically Signed   By: Margarette Canada M.D.   On: 02/18/2018 10:50    Cardiac Studies   Echo 03/04/2018: Study Conclusions  - Left ventricle: The cavity size was normal. There was mild concentric hypertrophy. Systolic function was moderately reduced. The estimated ejection fraction was in the range of 35% to 40%. Akinesis of the mid-apicalanteroseptal, anterior, inferior, inferoseptal, and apical myocardium. - Aortic valve: There was mild regurgitation. - Mitral valve: Calcified annulus. There was moderate regurgitation. - Left atrium: The atrium was mildly dilated. - Tricuspid valve: There was moderate regurgitation. - Pulmonary arteries: Systolic pressure was mildly to moderately increased. PA peak pressure: 44 mm Hg (S).  Impressions:  - Wall motion abnormalities suggestive of stress induced cardiomyopathy vs. LAD infarct.  Patient Profile     84 y.o. male with history of CAD s/p 3-vessel CABG in [place], permanent Afib on Coumadin, HFpEF, CVA, DVT, HTN, HLD dementia, and Parkinson's disease who we are asked to evaluate for elevated troponin in the setting of acute respiratory failure with hypoxia in the setting of suspected ARDS/PNA/acute systolic and acute on chronic diastolic  CHF.  Assessment & Plan    NSTEMI Unable to assess symptoms as patient is intubated and sedated.  Troponin peaked at 25 on 12/1.  Given history and marked troponin elevation with new wall motion abnormality, I am concerned for acute MI with ischemic cardiomyopathy.  Defer catheterization given multiple comorbidities.  Continue heparin infusion, which is also ongoing for atrial fibrillation.  Continue aspirin and low-dose metoprolol.  I will decrease aspirin to 81 mg daily.  Check lipid panel and add statin (on atorvastatin 20 mg daily at home).  Acute systolic heart failure Patient appears grossly euvolemic, though challenging to appreciate due to support devices.  Maintain net even fluid balance.  Continue metoprolol tartrate 25 mg twice daily.  Would like to transition to evidence-based beta-blocker when able.  Defer adding ACE inhibitor/ARB/aldosterone antagonist in the setting of hypotension.  Avoid propofol, if possible.  Acute hypoxic respiratory failure  Maintain net even fluid balance.  Further management per critical care medicine.  Permanent atrial fibrillation Heart rate generally well controlled though intermittently spikes up to 150, presumably in the setting of agitation.  Continue metoprolol tartrate 25 mg twice daily.  I am hesitant to escalate this due to intermittent hypotension.  Start digoxin 0.125 mg daily.  For questions or updates, please contact [place] Please consult [email or web address] for contact info under [facility].     Signed, Nelva Bush, MD  02/19/2018, 12:26 PM

## 2018-02-19 NOTE — Progress Notes (Signed)
Patient ID: Adam Roberson, male   DOB: Aug 02, 1928, 84 y.o.   MRN: [id number]  [facility] AJO:[id number] DOB: 1928-04-22 DOA: 02/26/2018 PCP: Katheren Shams  HPI/Subjective: Patient intubated and sedated  Objective: Vitals:   02/19/18 1100 02/19/18 1200  BP: (!) 93/58 90/60  Pulse: 97 85  Resp: (!) 27 (!) 24  Temp:    SpO2: 99% 99%    Intake/Output Summary (Last 24 hours) at 02/19/2018 1340 Last data filed at 02/19/2018 1234 Gross per 24 hour  Intake 803.95 ml  Output 2595 ml  Net -1791.05 ml   Filed Weights   02/16/18 0500 02/17/18 0301 02/19/18 0500  Weight: 62.4 kg 63.2 kg 64.9 kg    ROS: Review of Systems  Unable to perform ROS: Intubated   Exam: Physical Exam  Constitutional: He is intubated.  HENT:  Nose: No mucosal edema.  Unable to look into mouth  Eyes: Conjunctivae and lids are normal.  Pupils pinpoint  Neck: Carotid bruit is not present.  Cardiovascular: S1 normal and S2 normal. An irregularly irregular rhythm present.  Murmur heard.  Systolic murmur is present with a grade of 2/6. Respiratory: No accessory muscle usage. He is intubated. He has decreased breath sounds in the right middle field, the right lower field, the left middle field and the left lower field. He has wheezes in the right middle field and the left middle field. He has rhonchi in the right lower field and the left lower field. He has no rales.  GI: Soft. Bowel sounds are normal. There is no tenderness.  Musculoskeletal:       Right ankle: He exhibits no swelling.       Left ankle: He exhibits no swelling.  Lymphadenopathy:    He has no cervical adenopathy.  Neurological:  Intubated and sedated  Skin: Skin is warm. No rash noted. Nails show clubbing.  Psychiatric:  Intubated and sedated unable to assess      Data Reviewed: Basic Metabolic Panel: Recent Labs  Lab 02/14/18 0712  02/15/18 0349 02/15/18 0352  02/16/18 0410  02/18/18 0329 02/18/18 1341 02/18/18 2119 02/19/18 0516  NA 139  --   --  140  --  143 147* 148*  --  151*  K 5.0   < >  --  3.1*   < > 3.8 3.0* 2.7* 3.4* 3.8  CL 112*  --   --  107  --  111 109 107  --  111  CO2 21*  --   --  26  --  28 32 35*  --  33*  GLUCOSE 107*  --   --  141*  --  201* 192* 188*  --  196*  BUN 13  --   --  16  --  27* 40* 44*  --  40*  CREATININE 0.77  --   --  1.09  --  1.00 0.87 0.86  --  0.83  CALCIUM 7.4*  --   --  7.0*  --  7.7* 7.7* 7.7*  --  7.7*  MG 1.9  --  2.0  --   --   --   --  2.0  --  2.2  PHOS 2.7  --  2.5  --   --   --   --   --   --   --    < > = values in this interval not displayed.   Liver Function Tests: Recent Labs  Lab 03/05/2018 0527 02/14/18 0712 02/15/18 0352  AST 102* 69* 99*  ALT '17 25 28  '$ ALKPHOS 86 56 59  BILITOT 0.6 1.0 0.4  PROT 7.7 5.3* 5.5*  ALBUMIN 3.6 2.4* 2.4*   Recent Labs  Lab 03/05/2018 0527  LIPASE 45   CBC: Recent Labs  Lab 03/06/2018 0527  02/14/18 0712 02/16/18 0410 02/17/18 0645 02/18/18 0329 02/19/18 0516  WBC 12.5*  --  9.2 9.8 9.2 8.9 10.5  NEUTROABS 10.9*  --  6.7  --   --   --   --   HGB 14.9   < > 12.9* 12.1* 11.9* 11.7* 12.2*  HCT 45.4   < > 39.8 38.6* 37.1* 36.1* 39.2  MCV 89.7  --  91.5 93.5 91.6 91.9 94.7  PLT 253  --  160 141* 152 173 160   < > = values in this interval not displayed.   Cardiac Enzymes: Recent Labs  Lab 03/07/2018 0527 03/08/2018 1255 02/18/2018 1628 02/14/18 1802 02/16/18 0410  TROPONINI 26.62* 19.25* 24.64* 22.15* 17.50*   BNP (last 3 results) Recent Labs    05/23/17 0147 08/17/17 0003  BNP 187.0* 242.0*    CBG: Recent Labs  Lab 02/18/18 2020 02/19/18 0043 02/19/18 0446 02/19/18 0744 02/19/18 1115  GLUCAP 173* 158* 168* 179* 161*    Recent Results (from the past 240 hour(s))  Blood Culture (routine x 2)     Status: None   Collection Time: 03/02/2018  5:27 AM  Result Value Ref Range Status   Specimen Description BLOOD RIGHT HAND  Final   Special  Requests   Final    BOTTLES DRAWN AEROBIC AND ANAEROBIC Blood Culture adequate volume   Culture   Final    NO GROWTH 5 DAYS Performed at [facility], [address], [place], [place] [address]    Report Status 02/18/2018 FINAL  Final  Blood Culture (routine x 2)     Status: None   Collection Time: 02/21/2018  5:27 AM  Result Value Ref Range Status   Specimen Description BLOOD LEFT FOREARM  Final   Special Requests   Final    BOTTLES DRAWN AEROBIC AND ANAEROBIC Blood Culture adequate volume   Culture   Final    NO GROWTH 5 DAYS Performed at [facility], [address] [place], [place] [address]    Report Status 02/18/2018 FINAL  Final  Urine culture     Status: None   Collection Time: 03/03/2018  5:27 AM  Result Value Ref Range Status   Specimen Description   Final    URINE, RANDOM Performed at [facility], [address], [place], [place] [address]    Special Requests   Final    NONE Performed at [facility], [address], [place], [place] [address]    Culture   Final    NO GROWTH Performed at [facility], [place] [address] [place], [place] [address]    Report Status 02/14/2018 FINAL  Final  MRSA PCR Screening     Status: None   Collection Time: 03/05/2018  8:32 AM  Result Value Ref Range Status   MRSA by PCR NEGATIVE NEGATIVE Final    Comment:        The GeneXpert MRSA Assay (FDA approved for NASAL specimens only), is one component of a comprehensive MRSA colonization surveillance program. It is not intended to diagnose MRSA infection nor to guide or monitor treatment for MRSA infections. Performed at [facility], [place], [place] [address]   CULTURE, BLOOD (ROUTINE X 2) w Reflex to ID Panel     Status: None (Preliminary result)   Collection Time: 02/18/18  9:19 PM  Result Value Ref Range Status   Specimen Description BLOOD RIGHT HAND  Final   Special Requests   Final    BOTTLES DRAWN  AEROBIC AND ANAEROBIC Blood Culture adequate volume   Culture   Final    NO GROWTH < 12 HOURS Performed at [facility], [address] [place], [place] [address]    Report Status PENDING  Incomplete  Culture, respiratory (non-expectorated)     Status: None (Preliminary result)   Collection Time: 02/18/18  9:21 PM  Result Value Ref Range Status   Specimen Description   Final    TRACHEAL ASPIRATE Performed at [facility], [address] [place], [place] [address]    Special Requests   Final    NONE Performed at [facility], [place], [place] [address]    Gram Stain   Final    ABUNDANT WBC PRESENT,BOTH PMN AND MONONUCLEAR RARE GRAM POSITIVE COCCI RARE GRAM VARIABLE ROD Performed at [facility], [place] [address], [place], [place] [address]    Culture PENDING  Incomplete   Report Status PENDING  Incomplete  CULTURE, BLOOD (ROUTINE X 2) w Reflex to ID Panel     Status: None (Preliminary result)   Collection Time: 02/18/18  9:31 PM  Result Value Ref Range Status   Specimen Description BLOOD RIGHT WRIST  Final   Special Requests   Final    BOTTLES DRAWN AEROBIC AND ANAEROBIC Blood Culture adequate volume   Culture   Final    NO GROWTH < 12 HOURS Performed at [facility], [address], [place], [place] [address]    Report Status PENDING  Incomplete     Studies: Dg Abd 1 View  Result Date: 02/19/2018 CLINICAL DATA:  Aspirated gastric contents. EXAM: ABDOMEN - 1 VIEW COMPARISON:  Chest x-ray dated February 13, 2018. FINDINGS: NG tube tip in the distal stomach. Nonobstructive bowel gas pattern. No acute osseous abnormality. IMPRESSION: 1. NG tube in the stomach.  No acute findings. Electronically Signed   By: Titus Dubin M.D.   On: 02/19/2018 09:24   Dg Chest Port 1 View  Result Date: 02/19/2018 CLINICAL DATA:  Acute respiratory failure. EXAM: PORTABLE CHEST 1 VIEW COMPARISON:  Chest x-ray dated February 18, 2018.  FINDINGS: Unchanged endotracheal and enteric tubes. Unchanged right upper extremity PICC line. Stable cardiomediastinal silhouette and diffusely increased interstitial markings. Unchanged small bilateral pleural effusions and bibasilar atelectasis. No pneumothorax. No acute osseous abnormality. IMPRESSION: Stable pulmonary edema and small bilateral pleural effusions. Electronically Signed   By: Titus Dubin M.D.   On: 02/19/2018 09:21   Dg Chest Port 1 View  Result Date: 02/19/2018 CLINICAL DATA:  Aspiration. EXAM: PORTABLE CHEST 1 VIEW COMPARISON:  02/19/2018 at 0545 hours FINDINGS: 0757 hours. Endotracheal tube terminates 2.6 cm above carina.Nasogastric tube extends beyond the inferior aspect of the film. Right-sided PICC line tip at low SVC. Normal heart size for level of inspiration. Atherosclerosis in the transverse aorta. Probable small bilateral pleural effusions. No pneumothorax. Moderate interstitial thickening is similar. Left greater than right base airspace disease. IMPRESSION: No significant change since earlier in the day. Bibasilar airspace disease, which could represent infection or aspiration. Concurrent interstitial thickening and small bilateral pleural effusions, suggesting congestive heart failure.  Aortic Atherosclerosis (ICD10-I70.0). Electronically Signed   By: Abigail Miyamoto M.D.   On: 02/19/2018 08:37   Dg Chest Port 1 View  Result Date: 02/18/2018 CLINICAL DATA:  Respiratory failure EXAM: PORTABLE CHEST 1 VIEW COMPARISON:  02/17/2018 and prior studies FINDINGS: An endotracheal tube with tip 2 cm above the carina, NG tube entering the stomach with tip off the field of view and RIGHT PICC line with tip overlying the SUPERIOR cavoatrial junction again noted. Cardiomediastinal silhouette is unchanged. CABG changes again identified. Bilateral interstitial opacities/edema again identified with bilateral pleural effusions. No pneumothorax. IMPRESSION: Unchanged appearance of the chest  with pulmonary edema and pleural effusions. Electronically Signed   By: Margarette Canada M.D.   On: 02/18/2018 10:50    Scheduled Meds: . aspirin  81 mg Per NG tube Once  . chlorhexidine gluconate (MEDLINE KIT)  15 mL Mouth Rinse BID  . digoxin  0.125 mg Per NG tube Daily  . docusate  100 mg Per Tube BID  . feeding supplement (PRO-STAT SUGAR FREE 64)  30 mL Per Tube Daily  . feeding supplement (VITAL 1.5 CAL)  1,000 mL Per Tube Q24H  . insulin aspart  0-9 Units Subcutaneous Q4H  . mouth rinse  15 mL Mouth Rinse 10 times per day  . metoprolol tartrate  25 mg Oral BID  . multivitamin  15 mL Per Tube Daily  . pantoprazole sodium  40 mg Per Tube Daily  . potassium chloride  40 mEq Per Tube Once  . QUEtiapine  25 mg Per Tube BID   Continuous Infusions: . dexmedetomidine (PRECEDEX) IV infusion 0.4 mcg/kg/hr (02/19/18 0800)  . dextrose 50 mL/hr at 02/19/18 1021  . fentaNYL infusion INTRAVENOUS Stopped (02/19/18 0204)  . heparin 1,000 Units/hr (02/19/18 1124)  . norepinephrine (LEVOPHED) Adult infusion    . phenylephrine (NEO-SYNEPHRINE) Adult infusion    . propofol (DIPRIVAN) infusion Stopped (02/15/18 1150)    Assessment/Plan:  1. Acute hypoxic respiratory failure.  Patient is currently intubated and sedated and on 28% FiO2. 2. NSTEMI.  Aspirin, metoprolol, heparin drip 3. Acute systolic congestive heart failure.  Intermittent diuresis 4. Atrial fibrillation on heparin drip and metoprolol and digoxin 5. Hypernatremia.  Critical care specialist starting D5W 6. History of Parkinson's disease.  We will have to restart Sinemet once able to take p.o. 7. Impaired fasting glucose.  On sliding scale coverage  Code Status:     Code Status Orders  (From admission, onward)         Start     Ordered   03/02/2018 0830  Full code  Continuous     03/07/2018 0829        Code Status History    Date Active Date Inactive Code Status Order ID Comments User Context   08/18/2017 0340 08/20/2017 1945  Full Code [id number]  Harrie Foreman, MD Inpatient   06/28/2015 2331 07/05/2015 1644 Full Code [id number]  Gladstone Lighter, MD Inpatient     Family Communication: As per critical care specialist Disposition Plan: To be determined  Consultants:  Critical care specialist  Cardiology  Time spent: 27 minutes  [place]

## 2018-02-19 NOTE — Progress Notes (Addendum)
Follow up - Critical Care Medicine Note  Patient Details:    Adam Roberson is an 84 y.o. male.  With a past medical history remarkable for coronary artery disease, hypertension, atrial fibrillation, diastolic heart failure, presented to the emergency department via EMS secondary to aggressive respiratory failure with hypoxemia.  Patient was subsequently intubated and admitted to the intensive care unit.  Was treated for respiratory failure and pneumonia.  Also found to have NSTEMI  Lines, Airways, Drains: Airway (Active)     Airway 8 mm (Active)  Secured at (cm) 22 cm 02/14/2018  8:47 AM  Measured From Lips 02/14/2018  8:47 AM  Secured Location Center 02/14/2018  8:47 AM  Secured By [employer] T[employer] Holder 02/14/2018  8:47 AM  Tube Holder Repositioned Yes 02/14/2018  2:38 AM  Cuff Pressure (cm H2O) 27 cm H2O 02/14/2018  8:47 AM  Site Condition Dry 02/14/2018  8:47 AM     NG/OG Tube Orogastric 18 Fr. Center mouth Xray;Aucultation Documented cm marking at nare/ corner of mouth 60 cm (Active)  Cm Marking at Nare/Corner of Mouth (if applicable) 60 cm 02/14/2018  4:00 AM  Site Assessment Clean;Dry;Intact 02/14/2018  4:00 AM  Ongoing Placement Verification No change in cm markings or external length of tube from initial placement 02/14/2018  4:00 AM  Status Suction-low intermittent 02/14/2018  4:00 AM  Amount of suction 80 mmHg 02/14/2018  4:00 AM  Drainage Appearance Green 02/14/2018  4:00 AM     Urethral Catheter Adam Roberson Straight-tip 16 Fr. (Active)  Indication for Insertion or Continuance of Catheter Unstable critical patients (first 24-48 hours) 02/14/2018  4:00 AM  Site Assessment Clean;Intact 02/14/2018  4:00 AM  Catheter Maintenance Bag below level of bladder;Catheter secured;Drainage bag/tubing not touching floor;Insertion date on drainage bag;No dependent loops;Seal intact 02/14/2018  8:00 AM  Collection Container Standard drainage bag 02/14/2018  4:00 AM  Securement Method Leg strap 02/14/2018   4:00 AM  Urinary Catheter Interventions Unclamped 02/14/2018  4:00 AM  Output (mL) 100 mL 02/14/2018  5:46 AM    Anti-infectives:  Anti-infectives (From admission, onward)   Start     Dose/Rate Route Frequency Ordered Stop   03/06/2018 1800  ceFEPIme (MAXIPIME) 2 g in sodium chloride 0.9 % 100 mL IVPB     2 g 200 mL/hr over 30 Minutes Intravenous Every 12 hours 02/25/2018 0654 02/17/18 1801   03/09/2018 1600  vancomycin (VANCOCIN) IVPB 1000 mg/200 mL premix  Status:  Discontinued     1,000 mg 200 mL/hr over 60 Minutes Intravenous Every 18 hours 02/19/2018 0831 02/14/18 1031   02/24/2018 0600  oseltamivir (TAMIFLU) capsule 75 mg  Status:  Discontinued     75 mg Oral  Once 03/12/2018 0555 03/04/2018 0758   02/25/2018 0530  ceFEPIme (MAXIPIME) 2 g in sodium chloride 0.9 % 100 mL IVPB     2 g 200 mL/hr over 30 Minutes Intravenous  Once 02/19/2018 0524 02/19/2018 0636   02/21/2018 0530  metroNIDAZOLE (FLAGYL) IVPB 500 mg  Status:  Discontinued     500 mg 100 mL/hr over 60 Minutes Intravenous Every 8 hours 03/13/2018 0524 03/12/2018 1109   03/08/2018 0530  vancomycin (VANCOCIN) IVPB 1000 mg/200 mL premix     1,000 mg 200 mL/hr over 60 Minutes Intravenous  Once 03/04/2018 0524 02/25/2018 0859      Microbiology: Results for orders placed or performed during the hospital encounter of 03/01/2018  Blood Culture (routine x 2)     Status: None   Collection  Time: 02/19/18  5:27 AM  Result Value Ref Range Status   Specimen Description BLOOD RIGHT HAND  Final   Special Requests   Final    BOTTLES DRAWN AEROBIC AND ANAEROBIC Blood Culture adequate volume   Culture   Final    NO GROWTH 5 DAYS Performed at [facility], [address] [place], [place] [address]    Report Status 02/18/2018 FINAL  Final  Blood Culture (routine x 2)     Status: None   Collection Time: 2018/02/19  5:27 AM  Result Value Ref Range Status   Specimen Description BLOOD LEFT FOREARM  Final   Special Requests   Final    BOTTLES DRAWN AEROBIC  AND ANAEROBIC Blood Culture adequate volume   Culture   Final    NO GROWTH 5 DAYS Performed at [facility], [address], [place], [place] [address]    Report Status 02/18/2018 FINAL  Final  Urine culture     Status: None   Collection Time: 19-Feb-2018  5:27 AM  Result Value Ref Range Status   Specimen Description   Final    URINE, RANDOM Performed at [facility], [address], [place], [place] [address]    Special Requests   Final    NONE Performed at [facility], [address], [place], [place] [address]    Culture   Final    NO GROWTH Performed at [facility], [address] [place], [place] [address]    Report Status 02/14/2018 FINAL  Final  MRSA PCR Screening     Status: None   Collection Time: 2018/02/19  8:32 AM  Result Value Ref Range Status   MRSA by PCR NEGATIVE NEGATIVE Final    Comment:        The GeneXpert MRSA Assay (FDA approved for NASAL specimens only), is one component of a comprehensive MRSA colonization surveillance program. It is not intended to diagnose MRSA infection nor to guide or monitor treatment for MRSA infections. Performed at [facility], [address] [place], [place] [address]   CULTURE, BLOOD (ROUTINE X 2) w Reflex to ID Panel     Status: None (Preliminary result)   Collection Time: 02/18/18  9:19 PM  Result Value Ref Range Status   Specimen Description BLOOD RIGHT HAND  Final   Special Requests   Final    BOTTLES DRAWN AEROBIC AND ANAEROBIC Blood Culture adequate volume   Culture   Final    NO GROWTH < 12 HOURS Performed at [facility], [address], [place], [place] [address]    Report Status PENDING  Incomplete  Culture, respiratory (non-expectorated)     Status: None (Preliminary result)   Collection Time: 02/18/18  9:21 PM  Result Value Ref Range Status   Specimen Description   Final    TRACHEAL ASPIRATE Performed at [facility], [address], [place], [place] [address]    Special Requests   Final    NONE Performed at [facility], [address] [place], [place] [address]    Gram Stain   Final    ABUNDANT WBC PRESENT,BOTH PMN AND MONONUCLEAR RARE GRAM POSITIVE COCCI RARE GRAM VARIABLE ROD Performed at [facility], [address], [place], [place] [address]    Culture PENDING  Incomplete   Report Status PENDING  Incomplete  CULTURE, BLOOD (ROUTINE X 2) w Reflex to ID Panel     Status: None (Preliminary  result)   Collection Time: 02/18/18  9:31 PM  Result Value Ref Range Status   Specimen Description BLOOD RIGHT WRIST  Final   Special Requests   Final    BOTTLES DRAWN AEROBIC AND ANAEROBIC Blood Culture adequate volume   Culture   Final    NO GROWTH < 12 HOURS Performed at [facility], [address] [place], [place] [address]    Report Status PENDING  Incomplete  Studies: Ct Angio Chest Pe W Or Wo Contrast  Result Date: 02/21/2018 CLINICAL DATA:  Decreased oxygen saturation requiring intubation. EXAM: CT ANGIOGRAPHY CHEST WITH CONTRAST TECHNIQUE: Multidetector CT imaging of the chest was performed using the standard protocol during bolus administration of intravenous contrast. Multiplanar CT image reconstructions and MIPs were obtained to evaluate the vascular anatomy. CONTRAST:  75mL OMNIPAQUE IOHEXOL 350 MG/ML SOLN COMPARISON:  Chest radiograph 02/24/2018 FINDINGS: Cardiovascular: Satisfactory opacification of the pulmonary arteries to the segmental level. No evidence of pulmonary embolism. Enlarged heart. No pericardial effusion. Calcific atherosclerotic disease of the coronary arteries and aorta. Annular calcifications of the mitral valve. Mediastinum/Nodes: Borderline enlarged bilateral mediastinal lymph nodes. The patient is intubated. Main bronchi are patent. Enteric catheter within the esophagus, terminates in the distal gastric body. Lungs/Pleura: Moderate right and small left pleural  effusions. Mild interstitial pulmonary edema. Scattered ground-glass opacities throughout both lungs, with more confluent appearance in the lower lobes, left greater than right. Upper Abdomen: Right renal cyst. 18 mm hypoattenuated nodule within the spleen, likely benign. Musculoskeletal: Findings of diffuse idiopathic skeletal hyperostosis of the thoracic spine Review of the MIP images confirms the above findings. IMPRESSION: Enlarged heart with interstitial pulmonary edema and bilateral pleural effusions. Superimposed patchy airspace consolidation in both lungs, more confluent in the lower lobes, suspicious for multifocal pneumonia. Borderline mediastinal lymphadenopathy, likely reactive. Aortic Atherosclerosis (ICD10-I70.0). Electronically Signed   By: Ted Mcalpineobrinka  Dimitrova M.D.   On: 02/17/2018 12:50   Dg Chest Port 1 View  Result Date: 02/19/2018 CLINICAL DATA:  Aspiration. EXAM: PORTABLE CHEST 1 VIEW COMPARISON:  02/19/2018 at 0545 hours FINDINGS: 0757 hours. Endotracheal tube terminates 2.6 cm above carina.Nasogastric tube extends beyond the inferior aspect of the film. Right-sided PICC line tip at low SVC. Normal heart size for level of inspiration. Atherosclerosis in the transverse aorta. Probable small bilateral pleural effusions. No pneumothorax. Moderate interstitial thickening is similar. Left greater than right base airspace disease. IMPRESSION: No significant change since earlier in the day. Bibasilar airspace disease, which could represent infection or aspiration. Concurrent interstitial thickening and small bilateral pleural effusions, suggesting congestive heart failure. Aortic Atherosclerosis (ICD10-I70.0). Electronically Signed   By: Jeronimo GreavesKyle  Talbot M.D.   On: 02/19/2018 08:37   Dg Chest Port 1 View  Result Date: 02/18/2018 CLINICAL DATA:  Respiratory failure EXAM: PORTABLE CHEST 1 VIEW COMPARISON:  02/17/2018 and prior studies FINDINGS: An endotracheal tube with tip 2 cm above the carina, NG  tube entering the stomach with tip off the field of view and RIGHT PICC line with tip overlying the SUPERIOR cavoatrial junction again noted. Cardiomediastinal silhouette is unchanged. CABG changes again identified. Bilateral interstitial opacities/edema again identified with bilateral pleural effusions. No pneumothorax. IMPRESSION: Unchanged appearance of the chest with pulmonary edema and pleural effusions. Electronically Signed   By: Harmon PierJeffrey  Hu M.D.   On: 02/18/2018 10:50   Dg Chest Port 1 View  Result Date: 02/17/2018 CLINICAL DATA:  Mechanically assisted ventilation. EXAM: PORTABLE CHEST 1 VIEW COMPARISON:  02/16/2018. FINDINGS: Endotracheal tube again terminates approximately 5 mm  above the carina. Proximal repositioning of approximately 2-3 cm again suggested. Interim placement of right PICC line, its tip is over the right atrium. NG tube noted with tip below left hemidiaphragm. Prior CABG. Cardiomegaly with bilateral pulmonary infiltrates/edema again noted. Small bilateral pleural effusions again noted. Findings suggest congestive heart failure. No pneumothorax. IMPRESSION: 1. Endotracheal tube tip again noted 5 mm above the carina, proximal repositioning of approximately 2 3 cm again suggested. 2. Interim placement of PICC line. Its tip is over the right atrium. 3. Prior CABG. Findings consistent with congestive heart failure bilateral pulmonary edema and bilateral pleural effusions again noted. These results will be called to the ordering clinician or representative by the Radiologist Assistant, and communication documented in the PACS or zVision Dashboard. Electronically Signed   By: Maisie Fus  Register   On: 02/17/2018 13:00   Dg Chest Port 1 View  Result Date: 02/16/2018 CLINICAL DATA:  Respiratory distress.  Patient on ventilation. EXAM: PORTABLE CHEST 1 VIEW COMPARISON:  February 15, 2018 FINDINGS: The ETT terminates slightly above the carina, 5 mm. An NG tube terminates below today's film. A  right PICC line terminates in the SVC. Diffuse interstitial opacities. Bilateral effusions. More focal infiltrate in the left base. Stable cardiomediastinal silhouette. IMPRESSION: 1. The ETT terminates slightly above the carina, 5 mm. Recommend withdrawing 2 cm. 2. Other support apparatus as above. 3. Diffuse interstitial opacities and bilateral effusions. 4. More focal infiltrate in the left base. These results will be called to the ordering clinician or representative by the Radiologist Assistant, and communication documented in the PACS or zVision Dashboard. Electronically Signed   By: Gerome Sam III M.D   On: 02/16/2018 14:22   Dg Chest Port 1 View  Result Date: 02/15/2018 CLINICAL DATA:  PICC Line placement EXAM: PORTABLE CHEST 1 VIEW COMPARISON:  02/15/2018 FINDINGS: Interval placement of RIGHT-sided PICC line, tip overlying the superior vena cava. Endotracheal tube is in place with tip approximately 1 centimeter above the carina. A nasogastric tube is in place, tip off the image beyond the gastroesophageal junction. The heart is enlarged and stable in configuration. There are patchy infiltrates in the lungs bilaterally. Bilateral pleural effusions are stable. IMPRESSION: Interval placement of RIGHT-sided PICC line, tip to the superior vena cava. Stable bilateral pleural effusions and parenchymal opacities. Electronically Signed   By: Norva Pavlov M.D.   On: 02/15/2018 09:40   Dg Chest Port 1 View  Result Date: 02/15/2018 CLINICAL DATA:  Acute respiratory failure EXAM: PORTABLE CHEST 1 VIEW COMPARISON:  03/04/2018 FINDINGS: Cardiac shadow is stable. Endotracheal tube is noted at the level of the carina. This could be withdrawn 2-3 cm. Nasogastric catheter extends into the stomach. Vascular congestion and increasing pleural effusions are seen. Bibasilar atelectasis/infiltrate is noted as well. IMPRESSION: Endotracheal tube at the level of the carina. This could be withdrawn 2-3 cm. Persistent  changes of CHF with increasing effusions bilaterally. Bibasilar opacities consistent with atelectasis or infiltrate. Electronically Signed   By: Alcide Clever M.D.   On: 02/15/2018 07:26   Dg Chest Port 1 View  Result Date: 02/28/2018 CLINICAL DATA:  Initial evaluation for sepsis, post intubation. EXAM: PORTABLE CHEST 1 VIEW COMPARISON:  Prior radiograph from 08/17/2017 FINDINGS: Endotracheal tube in place with tip position approximately 15 mm above the carina. Enteric tube courses into the abdomen with side hole beyond the GE junction. Median sternotomy wires underlying surgical clips noted, stable. Cardiomegaly unchanged. Mediastinal silhouette normal. Aortic atherosclerosis. Lungs are mildly hypoinflated. Diffuse pulmonary vascular  and interstitial congestion, suggesting pulmonary interstitial edema. There are superimposed confluent multifocal opacities at the right mid and lower lung as well as the left lung base, suspicious for infiltrates. Associated small right pleural effusion. No pneumothorax. No acute osseus abnormality. IMPRESSION: 1. Tip of the endotracheal tube approximately 15 mm above the carina. 2. Cardiomegaly with diffuse pulmonary vascular and interstitial congestion, suggesting pulmonary interstitial edema. 3. Superimposed multifocal opacities at the right mid and lower lung and left lung base, suspicious for infiltrates. 4. Small right pleural effusion. Electronically Signed   By: Rise Mu M.D.   On: 03/12/2018 05:57   [place] Ekg Site Rite  Result Date: 02/14/2018 If Site Rite image not attached, placement could not be confirmed due to current cardiac rhythm.   Consults: Treatment Team:  Iran Ouch, MD   Subjective:    Overnight Issues: Patient intubated, sedated with atrial fibrillation.  Patient with intermittent uncontrolled ventricular response, on beta-blockade and Seroquel begun.  Has been failing spontaneous awakening and breathing trials.  Discussions with  family.  We will continue weaning trials this weekend and more definitive decisions early next week if not successful  Objective:  Vital signs for last 24 hours: Temp:  [98 F (36.7 C)-103 F (39.4 C)] 99.2 F (37.3 C) (12/07 0800) Pulse Rate:  [28-139] 98 (12/07 0900) Resp:  [0-36] 26 (12/07 0900) BP: (84-135)/(51-97) 135/97 (12/07 0900) SpO2:  [96 %-99 %] 99 % (12/07 0900) FiO2 (%):  [28 %] 28 % (12/07 0839) Weight:  [64.9 kg] 64.9 kg (12/07 0500)  Intake/Output from previous day: 12/06 0701 - 12/07 0700 In: 1129.2 [I.V.:409.2; NG/GT:720] Out: 2045 [Urine:2045]  Intake/Output this shift: Total I/O In: 43.2 [I.V.:43.2] Out: 175 [Urine:175]  Vent settings for last 24 hours: Vent Mode: PRVC FiO2 (%):  [28 %] 28 % Set Rate:  [16 bmp] 16 bmp Vt Set:  [400 mL] 400 mL PEEP:  [5 cmH20] 5 cmH20 Pressure Support:  [5 cmH20] 5 cmH20 Plateau Pressure:  [22 cmH20-32 cmH20] 32 cmH20  Physical Exam:  Vital signs: Please see the above listed vital signs HEENT: Patient is orally intubated, trachea midline, no thyromegaly appreciated Cardiovascular: Irregularly iregular rhythm with rapid ventricular response Pulmonary: Coarse rhonchi and rales appreciated diffusely Abdominal: Positive bowel sounds, soft exam Extremities: No clubbing, cyanosis or edema noted Neurologic: Limited exam as patient is on mechanical ventilation  Assessment/Plan:   Hypoxemic respiratory failure.  Multifactorial etiology to include decompensated heart failure, bilateral effusions and interstitial edema noted on CT scan and chest x-ray.  Patient diuresed approximately 300 yesterday, has completed a course of antibiotics and cefepime has been stopped  NSTEMI.  Troponin decreasing to 17.5, being followed by cardiology, on aspirin and heparin.  No ischemic changes appreciated on EKG. echocardiogram performed revealed reduced ejection fraction at 35 to 40% with significant hypokinesia consistent with stress-induced  cardiomyopathy versus LAD infarction.  Additional medicines have been limited secondary to marginal blood pressure  Hypernatremia.  We will hold Lasix today and start on low rate D5W  Atrial fibrillation.  Presently on Lopressor  Prerenal azotemia.  Being BMP  Hyperglycemia.  Scale coverage  We will try again spontaneous awakening and breathing trial this morning Discussed with family yesterday.  If unsuccessful weaning over the weekend more definitive decisions regarding tracheostomy versus comfort care   Critical  care time 35 minutes  Tora Kindred, DO   Julee Stoll 02/19/2018  *Care during the described time interval was provided by me and/or other providers on the  critical care team.  I have reviewed this patient's available data, including medical history, events of note, physical examination and test results as part of my evaluation. Patient ID: Roosevelt Eimers, male   DOB: 1928/10/02, 84 y.o.   MRN: [id number] Patient ID: Sueo Cullen, male   DOB: May 29, 1928, 84 y.o.   MRN: [id number] Patient ID: Zain Lankford, male   DOB: 1928-08-24, 84 y.o.   MRN: [id number] Patient ID: Assad Harbeson, male   DOB: Oct 15, 1928, 84 y.o.   MRN: [id number] Patient ID: Harjot Dibello, male   DOB: 07/11/1928, 84 y.o.   MRN: [id number]

## 2018-02-20 ENCOUNTER — Inpatient Hospital Stay: Payer: Medicaid Other

## 2018-02-20 LAB — URINE CULTURE: Culture: NO GROWTH

## 2018-02-20 LAB — GLUCOSE, CAPILLARY
Glucose-Capillary: 177 mg/dL — ABNORMAL HIGH (ref 70–99)
Glucose-Capillary: 151 mg/dL — ABNORMAL HIGH (ref 70–99)
Glucose-Capillary: 155 mg/dL — ABNORMAL HIGH (ref 70–99)
Glucose-Capillary: 149 mg/dL — ABNORMAL HIGH (ref 70–99)
Glucose-Capillary: 135 mg/dL — ABNORMAL HIGH (ref 70–99)

## 2018-02-20 LAB — CBC
HCT: 35.6 % — ABNORMAL LOW (ref 39.0–52.0)
Hemoglobin: 11.1 g/dL — ABNORMAL LOW (ref 13.0–17.0)
MCH: 29.8 pg (ref 26.0–34.0)
MCHC: 31.2 g/dL (ref 30.0–36.0)
MCV: 95.4 fL (ref 80.0–100.0)
Platelets: 167 10*3/uL (ref 150–400)
RBC: 3.73 MIL/uL — AB (ref 4.22–5.81)
RDW: 14.2 % (ref 11.5–15.5)
WBC: 8.7 10*3/uL (ref 4.0–10.5)
nRBC: 0 % (ref 0.0–0.2)

## 2018-02-20 LAB — LIPID PANEL
Cholesterol: 121 mg/dL (ref 0–200)
HDL: 20 mg/dL — ABNORMAL LOW (ref >40)
LDL CALC: 87 mg/dL (ref 0–99)
Total CHOL/HDL Ratio: 6.1 RATIO
Triglycerides: 70 mg/dL (ref <150)
VLDL: 14 mg/dL (ref 0–40)

## 2018-02-20 LAB — BASIC METABOLIC PANEL
Anion gap: 8 (ref 5–15)
BUN: 40 mg/dL — ABNORMAL HIGH (ref 8–23)
CO2: 33 mmol/L — ABNORMAL HIGH (ref 22–32)
CREATININE: 0.84 mg/dL (ref 0.61–1.24)
Calcium: 7.4 mg/dL — ABNORMAL LOW (ref 8.9–10.3)
Chloride: 107 mmol/L (ref 98–111)
GFR calc non Af Amer: >60 mL/min (ref >60)
Glucose, Bld: 198 mg/dL — ABNORMAL HIGH (ref 70–99)
Potassium: 3.5 mmol/L (ref 3.5–5.1)
Sodium: 148 mmol/L — ABNORMAL HIGH (ref 135–145)

## 2018-02-20 LAB — HEPARIN LEVEL (UNFRACTIONATED): Heparin Unfractionated: 0.51 IU/mL (ref 0.30–0.70)

## 2018-02-20 MED ORDER — POTASSIUM CHLORIDE 20 MEQ/15ML (10%) PO SOLN
20.0000 meq | Freq: Once | ORAL | Status: AC
  Administered 2018-02-20: 20 meq via ORAL
  Filled 2018-02-20 (×2): qty 15

## 2018-02-20 MED ORDER — ATORVASTATIN CALCIUM 20 MG PO TABS
20.0000 mg | ORAL_TABLET | Freq: Every day | ORAL | Status: DC
  Administered 2018-02-20 – 2018-02-21 (×2): 20 mg via NASOGASTRIC
  Filled 2018-02-20 (×2): qty 1

## 2018-02-20 MED ORDER — ASPIRIN 81 MG PO CHEW
81.0000 mg | CHEWABLE_TABLET | Freq: Every day | ORAL | Status: DC
  Administered 2018-02-20 – 2018-02-22 (×3): 81 mg via NASOGASTRIC
  Filled 2018-02-20 (×3): qty 1

## 2018-02-20 MED ORDER — POTASSIUM CHLORIDE 20 MEQ/15ML (10%) PO SOLN
40.0000 meq | Freq: Once | ORAL | Status: AC
  Administered 2018-02-20: 40 meq
  Filled 2018-02-20 (×2): qty 30

## 2018-02-20 NOTE — Progress Notes (Signed)
Progress Note  Patient Name: Adam Roberson Date of Encounter: 02/20/2018  Primary Cardiologist: Ailene Rud, MD  Subjective   Intubated and sedated.  No family present.  Inpatient Medications    Scheduled Meds: . chlorhexidine gluconate (MEDLINE KIT)  15 mL Mouth Rinse BID  . digoxin  0.125 mg Per NG tube Daily  . docusate  100 mg Per Tube BID  . feeding supplement (PRO-STAT SUGAR FREE 64)  30 mL Per Tube Daily  . feeding supplement (VITAL 1.5 CAL)  1,000 mL Per Tube Q24H  . insulin aspart  0-9 Units Subcutaneous Q4H  . mouth rinse  15 mL Mouth Rinse 10 times per day  . metoprolol tartrate  25 mg Oral BID  . multivitamin  15 mL Per Tube Daily  . pantoprazole sodium  40 mg Per Tube Daily  . potassium chloride  20 mEq Oral Once  . potassium chloride  40 mEq Per Tube Once  . QUEtiapine  25 mg Per Tube BID   Continuous Infusions: . dexmedetomidine (PRECEDEX) IV infusion 0.6 mcg/kg/hr (02/20/18 0815)  . dextrose Stopped (02/20/18 0517)  . fentaNYL infusion INTRAVENOUS 150 mcg/hr (02/20/18 0815)  . heparin 1,000 Units/hr (02/20/18 0815)  . norepinephrine (LEVOPHED) Adult infusion    . phenylephrine (NEO-SYNEPHRINE) Adult infusion    . propofol (DIPRIVAN) infusion Stopped (02/15/18 1150)   PRN Meds: acetaminophen **OR** acetaminophen, fentaNYL, fentaNYL (SUBLIMAZE) injection, metoprolol tartrate, ondansetron **OR** ondansetron (ZOFRAN) IV, sodium chloride flush   Vital Signs    Vitals:   02/20/18 0800 02/20/18 0911 02/20/18 0912 02/20/18 0921  BP: 100/63 103/61    Pulse: 84 84 97   Resp: (!) 25     Temp: 99.5 F (37.5 C)   97.9 F (36.6 C)  TempSrc: Axillary   Axillary  SpO2: 97%     Weight:      Height:        Intake/Output Summary (Last 24 hours) at 02/20/2018 1030 Last data filed at 02/20/2018 0815 Gross per 24 hour  Intake 2947.04 ml  Output 1445 ml  Net 1502.04 ml   Filed Weights   02/17/18 0301 02/19/18 0500 02/20/18 0500  Weight: 63.2 kg 64.9  kg 64.6 kg    Telemetry    Atrial fibrillation with ventricular rates of 95 to 150 bpm.  Heart rate typically around 100 bpm when not agitated. - Personally Reviewed  ECG   New tracing.  Physical Exam   GEN:  Intubated and sedated.  Withdraws to pain. Neck:  Unable to assess due to support devices. Cardiac:  Irregularly irregular without murmurs. Respiratory:  Coarse breath sounds anteriorly. GI: Soft, nontender, non-distended  MS: No edema; No deformity. Neuro:   Intubated and sedated.  Withdraws to palpation of the left leg.Marland Kitchen Psych: Intubated and sedated.  Labs    Chemistry Recent Labs  Lab 02/14/18 [id number]  02/15/18 0352  02/18/18 1341 02/18/18 2119 02/19/18 0516 02/20/18 0521  NA 139  --  140   < > 148*  --  151* 148*  K 5.0   < > 3.1*   < > 2.7* 3.4* 3.8 3.5  CL 112*  --  107   < > 107  --  111 107  CO2 21*  --  26   < > 35*  --  33* 33*  GLUCOSE 107*  --  141*   < > 188*  --  196* 198*  BUN 13  --  16   < > 44*  --  40* 40*  CREATININE 0.77  --  1.09   < > 0.86  --  0.83 0.84  CALCIUM 7.4*  --  7.0*   < > 7.7*  --  7.7* 7.4*  PROT 5.3*  --  5.5*  --   --   --   --   --   ALBUMIN 2.4*  --  2.4*  --   --   --   --   --   AST 69*  --  99*  --   --   --   --   --   ALT 25  --  28  --   --   --   --   --   ALKPHOS 56  --  59  --   --   --   --   --   BILITOT 1.0  --  0.4  --   --   --   --   --   GFRNONAA >60  --  >60   < > >60  --  >60 >60  GFRAA >60  --  >60   < > >60  --  >60 >60  ANIONGAP 6  --  7   < > 6  --  7 8   < > = values in this interval not displayed.     Hematology Recent Labs  Lab 02/18/18 0329 02/19/18 0516 02/20/18 0521  WBC 8.9 10.5 8.7  RBC 3.93* 4.14* 3.73*  HGB 11.7* 12.2* 11.1*  HCT 36.1* 39.2 35.6*  MCV 91.9 94.7 95.4  MCH 29.8 29.5 29.8  MCHC 32.4 31.1 31.2  RDW 13.8 14.2 14.2  PLT 173 160 167    Cardiac Enzymes Recent Labs  Lab 02/19/2018 1255 03/14/2018 1628 02/14/18 1802 02/16/18 0410  TROPONINI 19.25* 24.64* 22.15*  17.50*   No results for input(s): TROPIPOC in the last 168 hours.   BNPNo results for input(s): BNP, PROBNP in the last 168 hours.   DDimer No results for input(s): DDIMER in the last 168 hours.   Radiology    Dg Abd 1 View  Result Date: 02/19/2018 CLINICAL DATA:  Aspirated gastric contents. EXAM: ABDOMEN - 1 VIEW COMPARISON:  Chest x-ray dated February 13, 2018. FINDINGS: NG tube tip in the distal stomach. Nonobstructive bowel gas pattern. No acute osseous abnormality. IMPRESSION: 1. NG tube in the stomach.  No acute findings. Electronically Signed   By: Titus Dubin M.D.   On: 02/19/2018 09:24   Dg Chest Port 1 View  Result Date: 02/19/2018 CLINICAL DATA:  Acute respiratory failure. EXAM: PORTABLE CHEST 1 VIEW COMPARISON:  Chest x-ray dated February 18, 2018. FINDINGS: Unchanged endotracheal and enteric tubes. Unchanged right upper extremity PICC line. Stable cardiomediastinal silhouette and diffusely increased interstitial markings. Unchanged small bilateral pleural effusions and bibasilar atelectasis. No pneumothorax. No acute osseous abnormality. IMPRESSION: Stable pulmonary edema and small bilateral pleural effusions. Electronically Signed   By: Titus Dubin M.D.   On: 02/19/2018 09:21   Dg Chest Port 1 View  Result Date: 02/19/2018 CLINICAL DATA:  Aspiration. EXAM: PORTABLE CHEST 1 VIEW COMPARISON:  02/19/2018 at 0545 hours FINDINGS: 0757 hours. Endotracheal tube terminates 2.6 cm above carina.Nasogastric tube extends beyond the inferior aspect of the film. Right-sided PICC line tip at low SVC. Normal heart size for level of inspiration. Atherosclerosis in the transverse aorta. Probable small bilateral pleural effusions. No pneumothorax. Moderate interstitial thickening is similar. Left greater than right base airspace disease. IMPRESSION: No significant change  since earlier in the day. Bibasilar airspace disease, which could represent infection or aspiration. Concurrent interstitial  thickening and small bilateral pleural effusions, suggesting congestive heart failure. Aortic Atherosclerosis (ICD10-I70.0). Electronically Signed   By: Abigail Miyamoto M.D.   On: 02/19/2018 08:37   Dg Chest Port 1 View  Result Date: 02/18/2018 CLINICAL DATA:  Respiratory failure EXAM: PORTABLE CHEST 1 VIEW COMPARISON:  02/17/2018 and prior studies FINDINGS: An endotracheal tube with tip 2 cm above the carina, NG tube entering the stomach with tip off the field of view and RIGHT PICC line with tip overlying the SUPERIOR cavoatrial junction again noted. Cardiomediastinal silhouette is unchanged. CABG changes again identified. Bilateral interstitial opacities/edema again identified with bilateral pleural effusions. No pneumothorax. IMPRESSION: Unchanged appearance of the chest with pulmonary edema and pleural effusions. Electronically Signed   By: Margarette Canada M.D.   On: 02/18/2018 10:50    Cardiac Studies   Echo 02/17/2018: Study Conclusions  - Left ventricle: The cavity size was normal. There was mild concentric hypertrophy. Systolic function was moderately reduced. The estimated ejection fraction was in the range of 35% to 40%. Akinesis of the mid-apicalanteroseptal, anterior, inferior, inferoseptal, and apical myocardium. - Aortic valve: There was mild regurgitation. - Mitral valve: Calcified annulus. There was moderate regurgitation. - Left atrium: The atrium was mildly dilated. - Tricuspid valve: There was moderate regurgitation. - Pulmonary arteries: Systolic pressure was mildly to moderately increased. PA peak pressure: 44 mm Hg (S).  Impressions:  - Wall motion abnormalities suggestive of stress induced cardiomyopathy vs. LAD infarct.  Patient Profile     84 y.o. male with history of CAD s/p 3-vessel CABG in [place], permanent Afib on Coumadin, HFpEF, CVA, DVT, HTN, HLD dementia, and Parkinson's disease who we are asked to evaluate for elevated troponin in the setting  of acute respiratory failure with hypoxia in the setting of suspected ARDS/PNA/acute systolic and acute on chronic diastolic CHF.  Assessment & Plan    NSTEMI Patient still intubated and unable to describe symptoms.  Troponin peaked at 25 last week.  Certainly there is concern for acute MI at the time of presentation complicated by ischemic cardiomyopathy.  No indication for catheterization at this time.  Continue medical therapy.  Readdress catheterization if/when patient is able to be weaned from ventilator.  Lipid panel pending.  Start atorvastatin 20 mg daily.  Continue aspirin 81 mg daily.  Permanent atrial fibrillation Heart rate control reasonable when patient is not agitated.  Currently on low-dose metoprolol and digoxin.  Continue metoprolol tartrate 25 mg twice daily and digoxin 120 mcg daily.  Soft blood pressures preclude up titration of metoprolol.  Consider checking digoxin level in 2 to 3 days.  Potassium level should maintained greater than 4.0.  Continue heparin infusion.  Acute systolic heart failure Patient grossly euvolemic, though exam is difficult due to support devices.  Patient continues to be net positive.  Maintain net even fluid balance, if possible.  Furosemide on hold given hypernatremia; D5 water started by CCM.  Careful monitoring of volume status given severely reduced LVEF.  Continue low-dose metoprolol.  Unable to add other evidence-based heart failure therapy in the setting of soft blood pressure.  Acute hypoxic respiratory failure and pneumonia Difficulty weaning ventilator due to agitation.  Further management per critical care medicine.  Avoid propofol, if possible.  For questions or updates, please contact [place] Please consult [email or web address] for contact info under [facility].     Signed, Nelva Bush, MD  02/20/2018,  10:30 AM

## 2018-02-20 NOTE — Progress Notes (Signed)
Patient ID: Adam Roberson, male   DOB: [birth year], 84 y.o.   MRN: [id number] Pulmonary/critical care  Family meeting. Discussed with family present situation.  Difficult to institute significant diuresis secondary to limitation with blood pressure.  Also patient sodium started to increase.  Discussed with family options to include palliative extubation and comfort care, continuing for a few more days, consideration for tracheostomy and LTAC.  They are going to talk amongst themselves and consider all options  Tora KindredJohn Felishia Wartman, DO

## 2018-02-20 NOTE — Progress Notes (Signed)
Follow up - Critical Care Medicine Note  Patient Details:    Adam Roberson is an 84 y.o. male.  With a past medical history remarkable for coronary artery disease, hypertension, atrial fibrillation, diastolic heart failure, presented to the emergency department via EMS secondary to aggressive respiratory failure with hypoxemia.  Patient was subsequently intubated and admitted to the intensive care unit.  Was treated for respiratory failure and pneumonia.  Also found to have NSTEMI  Lines, Airways, Drains: Airway (Active)     Airway 8 mm (Active)  Secured at (cm) 22 cm 02/14/2018  8:47 AM  Measured From Lips 02/14/2018  8:47 AM  Secured Location Center 02/14/2018  8:47 AM  Secured By [employer] Tube Holder 02/14/2018  8:47 AM  Tube Holder Repositioned Yes 02/14/2018  2:38 AM  Cuff Pressure (cm H2O) 27 cm H2O 02/14/2018  8:47 AM  Site Condition Dry 02/14/2018  8:47 AM     NG/OG Tube Orogastric 18 Fr. Center mouth Xray;Aucultation Documented cm marking at nare/ corner of mouth 60 cm (Active)  Cm Marking at Nare/Corner of Mouth (if applicable) 60 cm 02/14/2018  4:00 AM  Site Assessment Clean;Dry;Intact 02/14/2018  4:00 AM  Ongoing Placement Verification No change in cm markings or external length of tube from initial placement 02/14/2018  4:00 AM  Status Suction-low intermittent 02/14/2018  4:00 AM  Amount of suction 80 mmHg 02/14/2018  4:00 AM  Drainage Appearance Green 02/14/2018  4:00 AM     Urethral Catheter April RN Straight-tip 16 Fr. (Active)  Indication for Insertion or Continuance of Catheter Unstable critical patients (first 24-48 hours) 02/14/2018  4:00 AM  Site Assessment Clean;Intact 02/14/2018  4:00 AM  Catheter Maintenance Bag below level of bladder;Catheter secured;Drainage bag/tubing not touching floor;Insertion date on drainage bag;No dependent loops;Seal intact 02/14/2018  8:00 AM  Collection Container Standard drainage bag 02/14/2018  4:00 AM  Securement Method Leg strap 02/14/2018   4:00 AM  Urinary Catheter Interventions Unclamped 02/14/2018  4:00 AM  Output (mL) 100 mL 02/14/2018  5:46 AM    Anti-infectives:  Anti-infectives (From admission, onward)   Start     Dose/Rate Route Frequency Ordered Stop   03/06/2018 1800  ceFEPIme (MAXIPIME) 2 g in sodium chloride 0.9 % 100 mL IVPB     2 g 200 mL/hr over 30 Minutes Intravenous Every 12 hours 02/25/2018 0654 02/17/18 1801   03/09/2018 1600  vancomycin (VANCOCIN) IVPB 1000 mg/200 mL premix  Status:  Discontinued     1,000 mg 200 mL/hr over 60 Minutes Intravenous Every 18 hours 02/19/2018 0831 02/14/18 1031   02/24/2018 0600  oseltamivir (TAMIFLU) capsule 75 mg  Status:  Discontinued     75 mg Oral  Once 03/12/2018 0555 03/04/2018 0758   02/25/2018 0530  ceFEPIme (MAXIPIME) 2 g in sodium chloride 0.9 % 100 mL IVPB     2 g 200 mL/hr over 30 Minutes Intravenous  Once 02/19/2018 0524 02/19/2018 0636   02/21/2018 0530  metroNIDAZOLE (FLAGYL) IVPB 500 mg  Status:  Discontinued     500 mg 100 mL/hr over 60 Minutes Intravenous Every 8 hours 03/13/2018 0524 03/12/2018 1109   03/08/2018 0530  vancomycin (VANCOCIN) IVPB 1000 mg/200 mL premix     1,000 mg 200 mL/hr over 60 Minutes Intravenous  Once 03/04/2018 0524 02/25/2018 0859      Microbiology: Results for orders placed or performed during the hospital encounter of 03/01/2018  Blood Culture (routine x 2)     Status: None   Collection  Time: Mar 13, 2018  5:27 AM  Result Value Ref Range Status   Specimen Description BLOOD RIGHT HAND  Final   Special Requests   Final    BOTTLES DRAWN AEROBIC AND ANAEROBIC Blood Culture adequate volume   Culture   Final    NO GROWTH 5 DAYS Performed at [facility], [name] [place], [place] [address]    Report Status 02/18/2018 FINAL  Final  Blood Culture (routine x 2)     Status: None   Collection Time: 2018-03-13  5:27 AM  Result Value Ref Range Status   Specimen Description BLOOD LEFT FOREARM  Final   Special Requests   Final    BOTTLES DRAWN AEROBIC  AND ANAEROBIC Blood Culture adequate volume   Culture   Final    NO GROWTH 5 DAYS Performed at [facility], [address] [place], [place] [address]    Report Status 02/18/2018 FINAL  Final  Urine culture     Status: None   Collection Time: 2018-03-13  5:27 AM  Result Value Ref Range Status   Specimen Description   Final    URINE, RANDOM Performed at [facility], [address] [place], [place] [address]    Special Requests   Final    NONE Performed at [facility], [address] [place], [place] [address]    Culture   Final    NO GROWTH Performed at [facility], [address] [place], [place] [address]    Report Status 02/14/2018 FINAL  Final  MRSA PCR Screening     Status: None   Collection Time: March 13, 2018  8:32 AM  Result Value Ref Range Status   MRSA by PCR NEGATIVE NEGATIVE Final    Comment:        The GeneXpert MRSA Assay (FDA approved for NASAL specimens only), is one component of a comprehensive MRSA colonization surveillance program. It is not intended to diagnose MRSA infection nor to guide or monitor treatment for MRSA infections. Performed at [facility], [address] [place], [place] [address]   CULTURE, BLOOD (ROUTINE X 2) w Reflex to ID Panel     Status: None (Preliminary result)   Collection Time: 02/18/18  9:19 PM  Result Value Ref Range Status   Specimen Description BLOOD RIGHT HAND  Final   Special Requests   Final    BOTTLES DRAWN AEROBIC AND ANAEROBIC Blood Culture adequate volume   Culture   Final    NO GROWTH < 12 HOURS Performed at [facility], [address], [place], [place] [address]    Report Status PENDING  Incomplete  Culture, respiratory (non-expectorated)     Status: None (Preliminary result)   Collection Time: 02/18/18  9:21 PM  Result Value Ref Range Status   Specimen Description   Final    TRACHEAL ASPIRATE Performed at [facility], [address] [place], [place] [address]    Special Requests   Final    NONE Performed at [facility], [address] [place], [place] [address]    Gram Stain   Final    ABUNDANT WBC PRESENT,BOTH PMN AND MONONUCLEAR RARE GRAM POSITIVE COCCI RARE GRAM VARIABLE ROD Performed at [facility], [address], [place], [place] [address]    Culture PENDING  Incomplete   Report Status PENDING  Incomplete  CULTURE, BLOOD (ROUTINE X 2) w Reflex to ID Panel     Status: None (Preliminary  result)   Collection Time: 02/18/18  9:31 PM  Result Value Ref Range Status   Specimen Description BLOOD RIGHT WRIST  Final   Special Requests   Final    BOTTLES DRAWN AEROBIC AND ANAEROBIC Blood Culture adequate volume   Culture   Final    NO GROWTH < 12 HOURS Performed at [facility], [address], [place], [place] [address]    Report Status PENDING  Incomplete  Studies: Dg Abd 1 View  Result Date: 02/19/2018 CLINICAL DATA:  Aspirated gastric contents. EXAM: ABDOMEN - 1 VIEW COMPARISON:  Chest x-ray dated 02-14-2018. FINDINGS: NG tube tip in the distal stomach. Nonobstructive bowel gas pattern. No acute osseous abnormality. IMPRESSION: 1. NG tube in the stomach.  No acute findings. Electronically Signed   By: Obie Dredge M.D.   On: 02/19/2018 09:24   Ct Angio Chest Pe W Or Wo Contrast  Result Date: 2018/02/14 CLINICAL DATA:  Decreased oxygen saturation requiring intubation. EXAM: CT ANGIOGRAPHY CHEST WITH CONTRAST TECHNIQUE: Multidetector CT imaging of the chest was performed using the standard protocol during bolus administration of intravenous contrast. Multiplanar CT image reconstructions and MIPs were obtained to evaluate the vascular anatomy. CONTRAST:  75mL OMNIPAQUE IOHEXOL 350 MG/ML SOLN COMPARISON:  Chest radiograph 02/14/2018 FINDINGS: Cardiovascular: Satisfactory opacification of the pulmonary arteries to the segmental level. No evidence of pulmonary embolism. Enlarged  heart. No pericardial effusion. Calcific atherosclerotic disease of the coronary arteries and aorta. Annular calcifications of the mitral valve. Mediastinum/Nodes: Borderline enlarged bilateral mediastinal lymph nodes. The patient is intubated. Main bronchi are patent. Enteric catheter within the esophagus, terminates in the distal gastric body. Lungs/Pleura: Moderate right and small left pleural effusions. Mild interstitial pulmonary edema. Scattered ground-glass opacities throughout both lungs, with more confluent appearance in the lower lobes, left greater than right. Upper Abdomen: Right renal cyst. 18 mm hypoattenuated nodule within the spleen, likely benign. Musculoskeletal: Findings of diffuse idiopathic skeletal hyperostosis of the thoracic spine Review of the MIP images confirms the above findings. IMPRESSION: Enlarged heart with interstitial pulmonary edema and bilateral pleural effusions. Superimposed patchy airspace consolidation in both lungs, more confluent in the lower lobes, suspicious for multifocal pneumonia. Borderline mediastinal lymphadenopathy, likely reactive. Aortic Atherosclerosis (ICD10-I70.0). Electronically Signed   By: Ted Mcalpine M.D.   On: 02/14/2018 12:50   Dg Chest Port 1 View  Result Date: 02/19/2018 CLINICAL DATA:  Acute respiratory failure. EXAM: PORTABLE CHEST 1 VIEW COMPARISON:  Chest x-ray dated February 18, 2018. FINDINGS: Unchanged endotracheal and enteric tubes. Unchanged right upper extremity PICC line. Stable cardiomediastinal silhouette and diffusely increased interstitial markings. Unchanged small bilateral pleural effusions and bibasilar atelectasis. No pneumothorax. No acute osseous abnormality. IMPRESSION: Stable pulmonary edema and small bilateral pleural effusions. Electronically Signed   By: Obie Dredge M.D.   On: 02/19/2018 09:21   Dg Chest Port 1 View  Result Date: 02/19/2018 CLINICAL DATA:  Aspiration. EXAM: PORTABLE CHEST 1 VIEW COMPARISON:   02/19/2018 at 0545 hours FINDINGS: 0757 hours. Endotracheal tube terminates 2.6 cm above carina.Nasogastric tube extends beyond the inferior aspect of the film. Right-sided PICC line tip at low SVC. Normal heart size for level of inspiration. Atherosclerosis in the transverse aorta. Probable small bilateral pleural effusions. No pneumothorax. Moderate interstitial thickening is similar. Left greater than right base airspace disease. IMPRESSION: No significant change since earlier in the day. Bibasilar airspace disease, which could represent infection or aspiration. Concurrent interstitial thickening and small bilateral pleural effusions, suggesting congestive heart failure. Aortic  Atherosclerosis (ICD10-I70.0). Electronically Signed   By: Jeronimo Greaves M.D.   On: 02/19/2018 08:37   Dg Chest Port 1 View  Result Date: 02/18/2018 CLINICAL DATA:  Respiratory failure EXAM: PORTABLE CHEST 1 VIEW COMPARISON:  02/17/2018 and prior studies FINDINGS: An endotracheal tube with tip 2 cm above the carina, NG tube entering the stomach with tip off the field of view and RIGHT PICC line with tip overlying the SUPERIOR cavoatrial junction again noted. Cardiomediastinal silhouette is unchanged. CABG changes again identified. Bilateral interstitial opacities/edema again identified with bilateral pleural effusions. No pneumothorax. IMPRESSION: Unchanged appearance of the chest with pulmonary edema and pleural effusions. Electronically Signed   By: Harmon Pier M.D.   On: 02/18/2018 10:50   Dg Chest Port 1 View  Result Date: 02/17/2018 CLINICAL DATA:  Mechanically assisted ventilation. EXAM: PORTABLE CHEST 1 VIEW COMPARISON:  02/16/2018. FINDINGS: Endotracheal tube again terminates approximately 5 mm above the carina. Proximal repositioning of approximately 2-3 cm again suggested. Interim placement of right PICC line, its tip is over the right atrium. NG tube noted with tip below left hemidiaphragm. Prior CABG. Cardiomegaly with  bilateral pulmonary infiltrates/edema again noted. Small bilateral pleural effusions again noted. Findings suggest congestive heart failure. No pneumothorax. IMPRESSION: 1. Endotracheal tube tip again noted 5 mm above the carina, proximal repositioning of approximately 2 3 cm again suggested. 2. Interim placement of PICC line. Its tip is over the right atrium. 3. Prior CABG. Findings consistent with congestive heart failure bilateral pulmonary edema and bilateral pleural effusions again noted. These results will be called to the ordering clinician or representative by the Radiologist Assistant, and communication documented in the PACS or zVision Dashboard. Electronically Signed   By: Maisie Fus  Register   On: 02/17/2018 13:00   Dg Chest Port 1 View  Result Date: 02/16/2018 CLINICAL DATA:  Respiratory distress.  Patient on ventilation. EXAM: PORTABLE CHEST 1 VIEW COMPARISON:  February 15, 2018 FINDINGS: The ETT terminates slightly above the carina, 5 mm. An NG tube terminates below today's film. A right PICC line terminates in the SVC. Diffuse interstitial opacities. Bilateral effusions. More focal infiltrate in the left base. Stable cardiomediastinal silhouette. IMPRESSION: 1. The ETT terminates slightly above the carina, 5 mm. Recommend withdrawing 2 cm. 2. Other support apparatus as above. 3. Diffuse interstitial opacities and bilateral effusions. 4. More focal infiltrate in the left base. These results will be called to the ordering clinician or representative by the Radiologist Assistant, and communication documented in the PACS or zVision Dashboard. Electronically Signed   By: Gerome Sam III M.D   On: 02/16/2018 14:22   Dg Chest Port 1 View  Result Date: 02/15/2018 CLINICAL DATA:  PICC Line placement EXAM: PORTABLE CHEST 1 VIEW COMPARISON:  02/15/2018 FINDINGS: Interval placement of RIGHT-sided PICC line, tip overlying the superior vena cava. Endotracheal tube is in place with tip approximately 1  centimeter above the carina. A nasogastric tube is in place, tip off the image beyond the gastroesophageal junction. The heart is enlarged and stable in configuration. There are patchy infiltrates in the lungs bilaterally. Bilateral pleural effusions are stable. IMPRESSION: Interval placement of RIGHT-sided PICC line, tip to the superior vena cava. Stable bilateral pleural effusions and parenchymal opacities. Electronically Signed   By: Norva Pavlov M.D.   On: 02/15/2018 09:40   Dg Chest Port 1 View  Result Date: 02/15/2018 CLINICAL DATA:  Acute respiratory failure EXAM: PORTABLE CHEST 1 VIEW COMPARISON:  03/15/2018 FINDINGS: Cardiac shadow is stable. Endotracheal tube is  noted at the level of the carina. This could be withdrawn 2-3 cm. Nasogastric catheter extends into the stomach. Vascular congestion and increasing pleural effusions are seen. Bibasilar atelectasis/infiltrate is noted as well. IMPRESSION: Endotracheal tube at the level of the carina. This could be withdrawn 2-3 cm. Persistent changes of CHF with increasing effusions bilaterally. Bibasilar opacities consistent with atelectasis or infiltrate. Electronically Signed   By: Alcide Clever M.D.   On: 02/15/2018 07:26   Dg Chest Port 1 View  Result Date: 03/13/2018 CLINICAL DATA:  Initial evaluation for sepsis, post intubation. EXAM: PORTABLE CHEST 1 VIEW COMPARISON:  Prior radiograph from 08/17/2017 FINDINGS: Endotracheal tube in place with tip position approximately 15 mm above the carina. Enteric tube courses into the abdomen with side hole beyond the GE junction. Median sternotomy wires underlying surgical clips noted, stable. Cardiomegaly unchanged. Mediastinal silhouette normal. Aortic atherosclerosis. Lungs are mildly hypoinflated. Diffuse pulmonary vascular and interstitial congestion, suggesting pulmonary interstitial edema. There are superimposed confluent multifocal opacities at the right mid and lower lung as well as the left lung  base, suspicious for infiltrates. Associated small right pleural effusion. No pneumothorax. No acute osseus abnormality. IMPRESSION: 1. Tip of the endotracheal tube approximately 15 mm above the carina. 2. Cardiomegaly with diffuse pulmonary vascular and interstitial congestion, suggesting pulmonary interstitial edema. 3. Superimposed multifocal opacities at the right mid and lower lung and left lung base, suspicious for infiltrates. 4. Small right pleural effusion. Electronically Signed   By: Rise Mu M.D.   On: 03/04/2018 05:57   [place] Ekg Site Rite  Result Date: 02/14/2018 If Site Rite image not attached, placement could not be confirmed due to current cardiac rhythm.   Consults: Treatment Team:  [place] Ouch, MD   Subjective:    Overnight Issues: Patient intubated, sedated with atrial fibrillation.  Patient with intermittent uncontrolled ventricular response Has been failing spontaneous awakening and breathing trials.  Discussions with family.  We will continue weaning trials this weekend and more definitive decisions early next week if not successful  Objective:  Vital signs for last 24 hours: Temp:  [98.1 F (36.7 C)-99.8 F (37.7 C)] 99.4 F (37.4 C) (12/08 0400) Pulse Rate:  [70-139] 88 (12/08 0630) Resp:  [0-32] 25 (12/08 0630) BP: (88-153)/(56-105) 106/65 (12/08 0630) SpO2:  [95 %-100 %] 96 % (12/08 0630) FiO2 (%):  [28 %] 28 % (12/08 0315) Weight:  [64.6 kg] 64.6 kg (12/08 0500)  Intake/Output from previous day: 12/07 0701 - 12/08 0700 In: 2925.2 [I.V.:1485.2; NG/GT:1440] Out: 1595 [Urine:1595]  Intake/Output this shift: Total I/O In: 1262.7 [I.V.:822.7; NG/GT:440] Out: 575 [Urine:575]  Vent settings for last 24 hours: Vent Mode: PRVC FiO2 (%):  [28 %] 28 % Set Rate:  [16 bmp] 16 bmp Vt Set:  [400 mL] 400 mL PEEP:  [5 cmH20] 5 cmH20 Plateau Pressure:  [29 cmH20] 29 cmH20  Physical Exam:  Vital signs: Please see the above listed vital  signs HEENT: Patient is orally intubated, trachea midline, no thyromegaly appreciated Cardiovascular: Irregularly iregular rhythm with rapid ventricular response Pulmonary: Coarse rhonchi and rales appreciated diffusely Abdominal: Positive bowel sounds, soft exam Extremities: No clubbing, cyanosis or edema noted Neurologic: Limited exam as patient is on mechanical ventilation  Assessment/Plan:   Hypoxemic respiratory failure.  Multifactorial etiology to include decompensated heart failure, bilateral effusions and interstitial edema noted on CT scan and chest x-ray.  Patient diuresed approximately 300 yesterday, has completed a course of antibiotics and cefepime has been stopped  NSTEMI.  Peak  troponin 25 appreciate cardiology assistance, on aspirin, statin and heparin.  No ischemic changes appreciated on EKG. echocardiogram performed revealed reduced ejection fraction at 35 to 40% with significant hypokinesia consistent with stress-induced cardiomyopathy versus LAD infarction.  Additional medicines have been limited secondary to marginal blood pressure  Hypernatremia.  We will hold Lasix today and start on low rate D5W.  Pete sodium decreased to 148  Atrial fibrillation.  Presently on Lopressor  Prerenal azotemia.  Being BMP  Hyperglycemia.  Scale coverage  We will try again spontaneous awakening and breathing trial this morning Discussed with family yesterday.  If unsuccessful weaning over the weekend more definitive decisions regarding tracheostomy versus comfort care   Critical  care time 35 minutes  Tora KindredJohn Zakiyah Diop, DO   Devanny Palecek 02/20/2018  *Care during the described time interval was provided by me and/or other providers on the critical care team.  I have reviewed this patient's available data, including medical history, events of note, physical examination and test results as part of my evaluation. Patient ID: Adam Roberson, male   DOB: [birth year], 84 y.o.   MRN:  [id number] Patient ID: Adam Roberson, male   DOB: [birth year], 84 y.o.   MRN: [id number] Patient ID: Adam Roberson, male   DOB: [birth year], 84 y.o.   MRN: [id number] Patient ID: Adam Roberson, male   DOB: [birth year], 84 y.o.   MRN: [id number] Patient ID: Adam Roberson, male   DOB: [birth year], 84 y.o.   MRN: [id number] Patient ID: Adam Roberson, male   DOB: [birth year], 84 y.o.   MRN: [id number]

## 2018-02-20 NOTE — Progress Notes (Signed)
ANTICOAGULATION CONSULT NOTE - Initial Consult  Pharmacy Consult for heparin drip Indication: chest pain/ACS  No Known Allergies  Patient Measurements: Height: 5\' 6"  (167.6 cm) Weight: 142 lb 6.7 oz (64.6 kg) IBW/kg (Calculated) : 63.8 Heparin Dosing Weight: 66 kg  Vital Signs: Temp: 99.4 F (37.4 C) (12/08 0400) Temp Source: Axillary (12/08 0400) BP: 106/65 (12/08 0630) Pulse Rate: 88 (12/08 0630)  Labs: Recent Labs    02/18/18 0329 02/18/18 1341 02/18/18 2119 02/19/18 0516 02/20/18 0521 02/20/18 0531  HGB 11.7*  --   --  12.2* 11.1*  --   HCT 36.1*  --   --  39.2 35.6*  --   PLT 173  --   --  160 167  --   HEPARINUNFRC 0.32 0.40 0.45 0.37  --  0.51  CREATININE 0.87 0.86  --  0.83 0.84  --     Estimated Creatinine Clearance: 55.9 mL/min (by C-G formula based on SCr of 0.84 mg/dL).   Medical History: Past Medical History:  Diagnosis Date  . CAD (coronary artery disease)    a.2003 [place] CAB[place] x3 in [place]; b. ? 2014 PCI (records not available).  . Chronic diastolic CHF (congestive heart failure) (HCC)    a. 06/2015 Echo: EF 60-65%, mod dil LA, nl RV, mild to mod TR, PASP 46mmHg.  Marland Kitchen. DVT (deep venous thrombosis) (HCC)   . DVT of leg (deep venous thrombosis) (HCC)   . H/O: CVA (cerebrovascular accident)   . HOH (hard of hearing)   . Hyperlipidemia   . Hypertension   . Hypertensive heart disease   . Parkinson disease (HCC)   . Permanent atrial fibrillation    a. CHA2DS2VASc= 7-->coumadin (followed by primary care).  . Pneumonia    4/17    Medications:  Warfarin as outpatient  Assessment: Trop  26.62   12/6 13:00 Heparin level resulted at 0.40, heparin infusing at 1000 units/hr.  Goal of Therapy:  Heparin level 0.3-0.7 units/ml Monitor platelets by anticoagulation protocol: Yes   Plan:  12/08 @ 0500 HL 0.51 therapeutic. Will continue rate @ 1000 units/hr and will recheck w/ am labs.  Thomasene Rippleavid Yamil Dougher, PharmD, BCPS Clinical Pharmacist 02/20/2018

## 2018-02-20 NOTE — Progress Notes (Signed)
MEDICATION RELATED CONSULT NOTE - ELECTROLYTE MANAGEMENT   Pharmacy Consult for Electrolyte Replacement Indication: Hypokalemia  No Known Allergies   Labs: BMET    Component Value Date/Time   NA 148 (H) 02/20/2018 0521   NA 138 08/04/2017 1145   NA 143 12/22/2011 1844   K 3.5 02/20/2018 0521   K 4.2 12/22/2011 1844   CL 107 02/20/2018 0521   CL 106 12/22/2011 1844   CO2 33 (H) 02/20/2018 0521   CO2 30 12/22/2011 1844   GLUCOSE 198 (H) 02/20/2018 0521   GLUCOSE 93 12/22/2011 1844   BUN 40 (H) 02/20/2018 0521   BUN 12 08/04/2017 1145   BUN 13 12/22/2011 1844   CREATININE 0.84 02/20/2018 0521   CREATININE 1.03 12/22/2011 1844   CALCIUM 7.4 (L) 02/20/2018 0521   CALCIUM 8.5 12/22/2011 1844   GFRNONAA >60 02/20/2018 0521   GFRNONAA >60 12/22/2011 1844   GFRAA >60 02/20/2018 0521   GFRAA >60 12/22/2011 1844    Estimated Creatinine Clearance: 55.9 mL/min (by C-G formula based on SCr of 0.84 mg/dL).   Assessment: Patient is an 84 yo male admitted for NSTEMI. Noted to be hypokalemic 12/6 morning. Pharmacy consulted for Electrolyte management.  Furosemide 20 mg IV BID - stopped 12/7  Goal of Therapy:  Maintain K ~ 4.0 and Mag ~ 2.0  Plan:  K 3.5  - will order KCl 40 mEq PO x1 now and 20 mEq PO x1 in 4 h Will recheck labs in AM   Crist FatWang, Dondra Rhett L 02/20/2018 10:13 AM

## 2018-02-20 NOTE — Progress Notes (Signed)
At approx 2130 patient was tachycardic (AFib) rate 120-150s and elevated DBP. He appeared uncomfortable and dyssynchronous with the vent. Required restart of fentanyl gtt.

## 2018-02-20 NOTE — Progress Notes (Signed)
Patient ID: Adam Roberson, male   DOB: Mar 27, 1928, 84 y.o.   MRN: [id number]  [facility] RXV:[id number] DOB: 05-09-1928 DOA: 03/04/2018 PCP: Katheren Shams  HPI/Subjective: Patient intubated and sedated  Objective: Vitals:   02/20/18 1300 02/20/18 1400  BP: 115/72 (!) 98/59  Pulse: 94 87  Resp: (!) 21 20  Temp:    SpO2: 98% 98%    Intake/Output Summary (Last 24 hours) at 02/20/2018 1530 Last data filed at 02/20/2018 1449 Gross per 24 hour  Intake 2884.64 ml  Output 1285 ml  Net 1599.64 ml   Filed Weights   02/17/18 0301 02/19/18 0500 02/20/18 0500  Weight: 63.2 kg 64.9 kg 64.6 kg    ROS: Review of Systems  Unable to perform ROS: Intubated   Exam: Physical Exam  Constitutional: He is intubated.  HENT:  Nose: No mucosal edema.  Unable to look into mouth  Eyes: Conjunctivae and lids are normal.  Pupils pinpoint  Neck: Carotid bruit is not present.  Cardiovascular: S1 normal and S2 normal. An irregularly irregular rhythm present.  Murmur heard.  Systolic murmur is present with a grade of 2/6. Respiratory: No accessory muscle usage. He is intubated. He has decreased breath sounds in the right middle field, the right lower field, the left middle field and the left lower field. He has wheezes in the right middle field and the left middle field. He has rhonchi in the right lower field and the left lower field. He has no rales.  GI: Soft. Bowel sounds are normal. There is no tenderness.  Musculoskeletal:       Right ankle: He exhibits no swelling.       Left ankle: He exhibits no swelling.  Lymphadenopathy:    He has no cervical adenopathy.  Neurological:  Intubated and sedated  Skin: Skin is warm. No rash noted. Nails show clubbing.  Psychiatric:  Intubated and sedated unable to assess      Data Reviewed: Basic Metabolic Panel: Recent Labs  Lab 02/14/18 0712  02/15/18 0349  02/16/18 0410 02/18/18 0329  02/18/18 1341 02/18/18 2119 02/19/18 0516 02/20/18 0521  NA 139  --   --    < > 143 147* 148*  --  151* 148*  K 5.0   < >  --    < > 3.8 3.0* 2.7* 3.4* 3.8 3.5  CL 112*  --   --    < > 111 109 107  --  111 107  CO2 21*  --   --    < > 28 32 35*  --  33* 33*  GLUCOSE 107*  --   --    < > 201* 192* 188*  --  196* 198*  BUN 13  --   --    < > 27* 40* 44*  --  40* 40*  CREATININE 0.77  --   --    < > 1.00 0.87 0.86  --  0.83 0.84  CALCIUM 7.4*  --   --    < > 7.7* 7.7* 7.7*  --  7.7* 7.4*  MG 1.9  --  2.0  --   --   --  2.0  --  2.2  --   PHOS 2.7  --  2.5  --   --   --   --   --   --   --    < > = values in this interval not displayed.  Liver Function Tests: Recent Labs  Lab 02/14/18 0712 02/15/18 0352  AST 69* 99*  ALT 25 28  ALKPHOS 56 59  BILITOT 1.0 0.4  PROT 5.3* 5.5*  ALBUMIN 2.4* 2.4*   CBC: Recent Labs  Lab 02/14/18 0712 02/16/18 0410 02/17/18 0645 02/18/18 0329 02/19/18 0516 02/20/18 0521  WBC 9.2 9.8 9.2 8.9 10.5 8.7  NEUTROABS 6.7  --   --   --   --   --   HGB 12.9* 12.1* 11.9* 11.7* 12.2* 11.1*  HCT 39.8 38.6* 37.1* 36.1* 39.2 35.6*  MCV 91.5 93.5 91.6 91.9 94.7 95.4  PLT 160 141* 152 173 160 167   Cardiac Enzymes: Recent Labs  Lab 03/04/2018 1628 02/14/18 1802 02/16/18 0410  TROPONINI 24.64* 22.15* 17.50*   BNP (last 3 results) Recent Labs    05/23/17 0147 08/17/17 0003  BNP 187.0* 242.0*    CBG: Recent Labs  Lab 02/19/18 1942 02/19/18 2351 02/20/18 0424 02/20/18 0737 02/20/18 1144  GLUCAP 154* 192* 177* 151* 155*    Recent Results (from the past 240 hour(s))  Blood Culture (routine x 2)     Status: None   Collection Time: 02/23/2018  5:27 AM  Result Value Ref Range Status   Specimen Description BLOOD RIGHT HAND  Final   Special Requests   Final    BOTTLES DRAWN AEROBIC AND ANAEROBIC Blood Culture adequate volume   Culture   Final    NO GROWTH 5 DAYS Performed at [facility], [address], [place], [place]  [address]    Report Status 02/18/2018 FINAL  Final  Blood Culture (routine x 2)     Status: None   Collection Time: 03/12/2018  5:27 AM  Result Value Ref Range Status   Specimen Description BLOOD LEFT FOREARM  Final   Special Requests   Final    BOTTLES DRAWN AEROBIC AND ANAEROBIC Blood Culture adequate volume   Culture   Final    NO GROWTH 5 DAYS Performed at [facility], [address] [place], [place] [address]    Report Status 02/18/2018 FINAL  Final  Urine culture     Status: None   Collection Time: 02/21/2018  5:27 AM  Result Value Ref Range Status   Specimen Description   Final    URINE, RANDOM Performed at [facility], [address], [place], [place] [address]    Special Requests   Final    NONE Performed at [facility], [address] [place], [place] [address]    Culture   Final    NO GROWTH Performed at [facility], [place] [address], [place], [place] [address]    Report Status 02/14/2018 FINAL  Final  MRSA PCR Screening     Status: None   Collection Time: 02/15/2018  8:32 AM  Result Value Ref Range Status   MRSA by PCR NEGATIVE NEGATIVE Final    Comment:        The GeneXpert MRSA Assay (FDA approved for NASAL specimens only), is one component of a comprehensive MRSA colonization surveillance program. It is not intended to diagnose MRSA infection nor to guide or monitor treatment for MRSA infections. Performed at [facility], [address] [place],  [address]   Urine Culture     Status: None   Collection Time: 02/18/18  5:56 PM  Result Value Ref Range Status   Specimen Description   Final    URINE, RANDOM Performed at [facility],  [place], [place] [address]    Special Requests   Final    NONE Performed at [facility], [address] [place], [place] [address]    Culture   Final    NO GROWTH Performed at [facility], [place] [address] [place], [place]  [address]    Report Status 02/20/2018 FINAL  Final  CULTURE, BLOOD (ROUTINE X 2) w Reflex to ID Panel     Status: None (Preliminary result)   Collection Time: 02/18/18  9:19 PM  Result Value Ref Range Status   Specimen Description BLOOD RIGHT HAND  Final   Special Requests   Final    BOTTLES DRAWN AEROBIC AND ANAEROBIC Blood Culture adequate volume   Culture   Final    NO GROWTH 2 DAYS Performed at [facility], [address], [place], [place] [address]    Report Status PENDING  Incomplete  Culture, respiratory (non-expectorated)     Status: None (Preliminary result)   Collection Time: 02/18/18  9:21 PM  Result Value Ref Range Status   Specimen Description   Final    TRACHEAL ASPIRATE Performed at [facility], [address] [place], [place] [address]    Special Requests   Final    NONE Performed at [facility], [place], [place] [address]    Gram Stain   Final    ABUNDANT WBC PRESENT,BOTH PMN AND MONONUCLEAR RARE GRAM POSITIVE COCCI RARE GRAM VARIABLE ROD Performed at [facility], [place] [address], [place], [place] [address]    Culture FEW STAPHYLOCOCCUS AUREUS  Final   Report Status PENDING  Incomplete  CULTURE, BLOOD (ROUTINE X 2) w Reflex to ID Panel     Status: None (Preliminary result)   Collection Time: 02/18/18  9:31 PM  Result Value Ref Range Status   Specimen Description BLOOD RIGHT WRIST  Final   Special Requests   Final    BOTTLES DRAWN AEROBIC AND ANAEROBIC Blood Culture adequate volume   Culture   Final    NO GROWTH 2 DAYS Performed at [facility], [address], [place], [place] [address]    Report Status PENDING  Incomplete     Studies: Dg Abd 1 View  Result Date: 02/19/2018 CLINICAL DATA:  Aspirated gastric contents. EXAM: ABDOMEN - 1 VIEW COMPARISON:  Chest x-ray dated February 13, 2018. FINDINGS: NG tube tip in the distal stomach. Nonobstructive bowel gas pattern. No acute osseous  abnormality. IMPRESSION: 1. NG tube in the stomach.  No acute findings. Electronically Signed   By: Titus Dubin M.D.   On: 02/19/2018 09:24   Dg Chest Port 1 View  Result Date: 02/20/2018 CLINICAL DATA:  Acute respiratory failure EXAM: PORTABLE CHEST 1 VIEW COMPARISON:  One day prior FINDINGS: Endotracheal tube terminates 3.6 cm above carina.Nasogastric tube extends beyond the inferior aspect of the film. Right-sided PICC line tip at superior caval/atrial junction. Prior median sternotomy. Normal heart size. Small right pleural effusion remains. Interstitial prominence and indistinctness, similar. Bibasilar airspace disease is not significantly changed. IMPRESSION: No significant change since one day prior. Congestive heart failure, with small right pleural effusion and bibasilar airspace disease. Electronically Signed   By: Abigail Miyamoto M.D.   On: 02/20/2018 10:35   Dg Chest Port 1 View  Result Date: 02/19/2018 CLINICAL DATA:  Acute respiratory failure. EXAM: PORTABLE CHEST 1 VIEW COMPARISON:  Chest x-ray dated February 18, 2018. FINDINGS: Unchanged endotracheal and enteric  tubes. Unchanged right upper extremity PICC line. Stable cardiomediastinal silhouette and diffusely increased interstitial markings. Unchanged small bilateral pleural effusions and bibasilar atelectasis. No pneumothorax. No acute osseous abnormality. IMPRESSION: Stable pulmonary edema and small bilateral pleural effusions. Electronically Signed   By: Titus Dubin M.D.   On: 02/19/2018 09:21   Dg Chest Port 1 View  Result Date: 02/19/2018 CLINICAL DATA:  Aspiration. EXAM: PORTABLE CHEST 1 VIEW COMPARISON:  02/19/2018 at 0545 hours FINDINGS: 0757 hours. Endotracheal tube terminates 2.6 cm above carina.Nasogastric tube extends beyond the inferior aspect of the film. Right-sided PICC line tip at low SVC. Normal heart size for level of inspiration. Atherosclerosis in the transverse aorta. Probable small bilateral pleural effusions.  No pneumothorax. Moderate interstitial thickening is similar. Left greater than right base airspace disease. IMPRESSION: No significant change since earlier in the day. Bibasilar airspace disease, which could represent infection or aspiration. Concurrent interstitial thickening and small bilateral pleural effusions, suggesting congestive heart failure. Aortic Atherosclerosis (ICD10-I70.0). Electronically Signed   By: Abigail Miyamoto M.D.   On: 02/19/2018 08:37    Scheduled Meds: . aspirin  81 mg Per NG tube Daily  . atorvastatin  20 mg Per NG tube q1800  . chlorhexidine gluconate (MEDLINE KIT)  15 mL Mouth Rinse BID  . digoxin  0.125 mg Per NG tube Daily  . docusate  100 mg Per Tube BID  . feeding supplement (PRO-STAT SUGAR FREE 64)  30 mL Per Tube Daily  . feeding supplement (VITAL 1.5 CAL)  1,000 mL Per Tube Q24H  . insulin aspart  0-9 Units Subcutaneous Q4H  . mouth rinse  15 mL Mouth Rinse 10 times per day  . metoprolol tartrate  25 mg Oral BID  . multivitamin  15 mL Per Tube Daily  . pantoprazole sodium  40 mg Per Tube Daily  . potassium chloride  20 mEq Oral Once  . QUEtiapine  25 mg Per Tube BID   Continuous Infusions: . dexmedetomidine (PRECEDEX) IV infusion 0.6 mcg/kg/hr (02/20/18 1449)  . dextrose Stopped (02/20/18 0517)  . fentaNYL infusion INTRAVENOUS 150 mcg/hr (02/20/18 1449)  . heparin 1,000 Units/hr (02/20/18 1449)  . norepinephrine (LEVOPHED) Adult infusion    . phenylephrine (NEO-SYNEPHRINE) Adult infusion    . propofol (DIPRIVAN) infusion Stopped (02/15/18 1150)    Assessment/Plan:  1. Acute hypoxic respiratory failure.  Patient is currently intubated and sedated and on 28% FiO2.  Critical care team speaking with family about tracheostomy and LTAC versus withdrawing care.  Patient currently a full code.  Patient on fentanyl and Precedex for sedation. 2. NSTEMI.  Aspirin, metoprolol, heparin drip 3. Acute systolic congestive heart failure.  Intermittent diuresis  limited with hypotension. 4. Atrial fibrillation on heparin drip and metoprolol and digoxin 5. Hypernatremia.  Critical care specialist starting D5W 6. History of Parkinson's disease.  We will have to restart Sinemet once able to take p.o. 7. Impaired fasting glucose.  On sliding scale coverage 8. Nutrition on tube feeds at 40 cc/h  Code Status:     Code Status Orders  (From admission, onward)         Start     Ordered   02/16/2018 0830  Full code  Continuous     03/15/2018 0829        Code Status History    Date Active Date Inactive Code Status Order ID Comments User Context   08/18/2017 0340 08/20/2017 1945 Full Code [id number]  Harrie Foreman, MD Inpatient   06/28/2015 [phone] 07/05/2015  [place] Full Code [id number]  Gladstone Lighter, MD Inpatient     Family Communication: As per critical care specialist Disposition Plan: To be determined  Consultants:  Critical care specialist  Cardiology  Time spent: 25 minutes  [place]

## 2018-02-21 LAB — BASIC METABOLIC PANEL
Anion gap: 7 (ref 5–15)
BUN: 39 mg/dL — ABNORMAL HIGH (ref 8–23)
CO2: 30 mmol/L (ref 22–32)
Calcium: 7.2 mg/dL — ABNORMAL LOW (ref 8.9–10.3)
Chloride: 108 mmol/L (ref 98–111)
Creatinine, Ser: 0.96 mg/dL (ref 0.61–1.24)
GFR calc Af Amer: >60 mL/min (ref >60)
Glucose, Bld: 208 mg/dL — ABNORMAL HIGH (ref 70–99)
Potassium: 4 mmol/L (ref 3.5–5.1)
Sodium: 145 mmol/L (ref 135–145)

## 2018-02-21 LAB — GLUCOSE, CAPILLARY
Glucose-Capillary: 165 mg/dL — ABNORMAL HIGH (ref 70–99)
Glucose-Capillary: 181 mg/dL — ABNORMAL HIGH (ref 70–99)
Glucose-Capillary: 184 mg/dL — ABNORMAL HIGH (ref 70–99)
Glucose-Capillary: 188 mg/dL — ABNORMAL HIGH (ref 70–99)
Glucose-Capillary: 194 mg/dL — ABNORMAL HIGH (ref 70–99)
Glucose-Capillary: 195 mg/dL — ABNORMAL HIGH (ref 70–99)
Glucose-Capillary: 198 mg/dL — ABNORMAL HIGH (ref 70–99)
Glucose-Capillary: 230 mg/dL — ABNORMAL HIGH (ref 70–99)

## 2018-02-21 LAB — POTASSIUM: Potassium: 6.6 mmol/L (ref 3.5–5.1)

## 2018-02-21 LAB — MAGNESIUM: Magnesium: 2.2 mg/dL (ref 1.7–2.4)

## 2018-02-21 LAB — CULTURE, RESPIRATORY

## 2018-02-21 LAB — CBC
HCT: 36.8 % — ABNORMAL LOW (ref 39.0–52.0)
Hemoglobin: 11.3 g/dL — ABNORMAL LOW (ref 13.0–17.0)
MCH: 29.3 pg (ref 26.0–34.0)
MCHC: 30.7 g/dL (ref 30.0–36.0)
MCV: 95.3 fL (ref 80.0–100.0)
Platelets: 156 10*3/uL (ref 150–400)
RBC: 3.86 MIL/uL — ABNORMAL LOW (ref 4.22–5.81)
RDW: 14.2 % (ref 11.5–15.5)
WBC: 8.3 10*3/uL (ref 4.0–10.5)
nRBC: 0 % (ref 0.0–0.2)

## 2018-02-21 LAB — CULTURE, RESPIRATORY W GRAM STAIN

## 2018-02-21 LAB — HEPARIN LEVEL (UNFRACTIONATED): HEPARIN UNFRACTIONATED: 0.54 [IU]/mL (ref 0.30–0.70)

## 2018-02-21 LAB — TRIGLYCERIDES: Triglycerides: 50 mg/dL (ref <150)

## 2018-02-21 MED ORDER — SODIUM CHLORIDE 0.9 % IV BOLUS
500.0000 mL | Freq: Once | INTRAVENOUS | Status: AC
  Administered 2018-02-21: 500 mL via INTRAVENOUS

## 2018-02-21 MED ORDER — POLYETHYLENE GLYCOL 3350 17 G PO PACK
17.0000 g | PACK | Freq: Every day | ORAL | Status: DC
  Administered 2018-02-21 – 2018-02-22 (×2): 17 g
  Filled 2018-02-21 (×2): qty 1

## 2018-02-21 MED ORDER — POTASSIUM CHLORIDE 10 MEQ/100ML IV SOLN
10.0000 meq | INTRAVENOUS | Status: AC
  Administered 2018-02-21 (×2): 10 meq via INTRAVENOUS
  Filled 2018-02-21 (×2): qty 100

## 2018-02-21 MED ORDER — POTASSIUM CHLORIDE 20 MEQ PO PACK: 40.0000 meq | PACK | ORAL | Status: DC

## 2018-02-21 MED ORDER — IPRATROPIUM-ALBUTEROL 0.5-2.5 (3) MG/3ML IN SOLN
3.0000 mL | RESPIRATORY_TRACT | Status: DC
  Administered 2018-02-21 – 2018-02-22 (×3): 3 mL via RESPIRATORY_TRACT
  Filled 2018-02-21 (×5): qty 3

## 2018-02-21 MED ORDER — POTASSIUM CHLORIDE 20 MEQ PO PACK
40.0000 meq | PACK | ORAL | Status: AC
  Administered 2018-02-21 (×2): 40 meq
  Filled 2018-02-21 (×2): qty 2

## 2018-02-21 MED ORDER — PROPOFOL 1000 MG/100ML IV EMUL
5.0000 ug/kg/min | INTRAVENOUS | Status: DC
  Administered 2018-02-21: 5 ug/kg/min via INTRAVENOUS
  Administered 2018-02-22: 40 ug/kg/min via INTRAVENOUS

## 2018-02-21 MED ORDER — METOPROLOL TARTRATE 5 MG/5ML IV SOLN
5.0000 mg | INTRAVENOUS | Status: DC | PRN
  Administered 2018-02-21 – 2018-02-22 (×2): 5 mg via INTRAVENOUS
  Filled 2018-02-21 (×2): qty 5

## 2018-02-21 MED ORDER — METOPROLOL TARTRATE 5 MG/5ML IV SOLN
5.0000 mg | Freq: Once | INTRAVENOUS | Status: AC
  Administered 2018-02-21: 5 mg via INTRAVENOUS
  Filled 2018-02-21: qty 5

## 2018-02-21 MED ORDER — DEXTROSE 50 % IV SOLN
1.0000 | Freq: Once | INTRAVENOUS | Status: AC
  Administered 2018-02-21: 50 mL via INTRAVENOUS
  Filled 2018-02-21: qty 50

## 2018-02-21 MED ORDER — INSULIN ASPART 100 UNIT/ML IV SOLN
10.0000 [IU] | Freq: Once | INTRAVENOUS | Status: AC
  Administered 2018-02-21: 10 [IU] via INTRAVENOUS
  Filled 2018-02-21: qty 0.1

## 2018-02-21 MED ORDER — SENNOSIDES 8.8 MG/5ML PO SYRP
5.0000 mL | ORAL_SOLUTION | Freq: Two times a day (BID) | ORAL | Status: DC
  Administered 2018-02-21 – 2018-02-22 (×3): 5 mL
  Filled 2018-02-21 (×4): qty 5

## 2018-02-21 NOTE — Progress Notes (Signed)
ANTICOAGULATION CONSULT NOTE - Initial Consult  Pharmacy Consult for heparin drip Indication: chest pain/ACS  No Known Allergies  Patient Measurements: Height: 5\' 6"  (167.6 cm) Weight: 143 lb 15.4 oz (65.3 kg) IBW/kg (Calculated) : 63.8 Heparin Dosing Weight: 66 kg  Vital Signs: Temp: 97.7 F (36.5 C) (12/09 0400) Temp Source: Axillary (12/09 0400) BP: 94/65 (12/09 0400) Pulse Rate: 89 (12/09 0400)  Labs: Recent Labs    02/19/18 0516 02/20/18 0521 02/20/18 0531 02/21/18 0509  HGB 12.2* 11.1*  --  11.3*  HCT 39.2 35.6*  --  36.8*  PLT 160 167  --  156  HEPARINUNFRC 0.37  --  0.51 0.54  CREATININE 0.83 0.84  --  0.96    Estimated Creatinine Clearance: 48.9 mL/min (by C-G formula based on SCr of 0.96 mg/dL).   Medical History: Past Medical History:  Diagnosis Date  . CAD (coronary artery disease)    a.2003 [place] CAB[place] x3 in [place]; b. ? 2014 PCI (records not available).  . Chronic diastolic CHF (congestive heart failure) (HCC)    a. 06/2015 Echo: EF 60-65%, mod dil LA, nl RV, mild to mod TR, PASP 46mmHg.  Marland Kitchen. DVT (deep venous thrombosis) (HCC)   . DVT of leg (deep venous thrombosis) (HCC)   . H/O: CVA (cerebrovascular accident)   . HOH (hard of hearing)   . Hyperlipidemia   . Hypertension   . Hypertensive heart disease   . Parkinson disease (HCC)   . Permanent atrial fibrillation    a. CHA2DS2VASc= 7-->coumadin (followed by primary care).  . Pneumonia    4/17    Medications:  Warfarin as outpatient  Assessment: Trop  26.62   12/6 13:00 Heparin level resulted at 0.40, heparin infusing at 1000 units/hr.  Goal of Therapy:  Heparin level 0.3-0.7 units/ml Monitor platelets by anticoagulation protocol: Yes   Plan:  12/09 @ 0500 HL 0.54 therapeutic. Will continue rate @ 1000 units/hr and will recheck w/ am labs. CBC stable.  Thomasene Rippleavid Hermila Millis, PharmD, BCPS Clinical Pharmacist 02/21/2018

## 2018-02-21 NOTE — Progress Notes (Signed)
   02/21/18 1000  Clinical Encounter Type  Visited With Patient and family together  Visit Type Initial;Spiritual support  Referral From Nurse  Recommendations [facility]  Spiritual Encounters  Spiritual Needs Emotional (See Note)   Patient is noncommunicative and his wife and daughter have limited English language skills. The patient practices the Baha'i faith. Chaplain offered a prayer of compassion and supplication for God's healing mercy. The daughter translated the gist of the prayer to his wife. She was pleased. The family understands the patient's poor prognosis and was tearful by the gesture of prayer. Both are actively grieving.

## 2018-02-21 NOTE — Progress Notes (Addendum)
PALLIATIVE NOTE:   Palliative referral received for goals of care discussion. I spoke with daughter, Adam Roberson and introduced myself and Palliative Medicine. She verbalized understanding and appreciation. Daughter and mother present in background during conversation.   Daughter requesting family meeting tomorrow 12/10 @ 1115. Confirmed timing and will meet family at patient's room.   Detailed note and recommendations to follow scheduled meeting on 07-18-2017 @ 1115.   Thank you for your referral and allowing Palliative Medicine to be involved in patient's care.   Willette AlmaNikki Pickenpack-Cousar, AGPCNP-BC Palliative Medicine Team  Phone: [phone] Fax: [phone] Pager: [phone] Amion: N. Cousar   NO CHARGE

## 2018-02-21 NOTE — Care Management Note (Signed)
Case Management Note  Patient Details  Name: Adam Roberson MRN: [id number] Date of Birth: 13-Jul-1928  Subjective/Objective:     Patient admitted with NSTEMI and respiratory failure.  Patient has poor prognosis, not tolerating vent weans.  Family meeting is scheduled for tomorrow at 11am with palliative care team.  RNCM spoke with family per family request.  Patient family has questions about the process after the patient passes away.  The patient's wife and daughter and grandson- grandson speaks [place] and interprets.  The wife wants to know if the family can wash the body after death, RNCM states that this should not be a problem, they want to know if there is a special room to do this- RNCM states that they can use the patient's room.  Daughter would like to know if the body can stay at the hospital after death and if it costs money for the body to remain in the morgue until funeral arrangements are made.  RNCM unclear on this but will check and get back to family by tomorrow.   RNCM will cont to follow and speak with family on 12/10.              Action/Plan:   Expected Discharge Date:                  Expected Discharge Plan:     In-House Referral:     Discharge planning Services     Post Acute Care Choice:    Choice offered to:     DME Arranged:    DME Agency:     HH Arranged:    HH Agency:     Status of Service:     If discussed at [employer] Length of Stay Meetings, dates discussed:    Additional Comments:  Allayne ButcherJeanna M Celestial Barnfield, RN 02/21/2018, 5:35 PM

## 2018-02-21 NOTE — Progress Notes (Signed)
pts RR still in 30s and 40s. ICU MD at bedside. Received new orders to titrate the precedex up to 1.5 mcg and a proprofol infusion. ICU MD instructed RN to start the propofol and wean down on the precedex.

## 2018-02-21 NOTE — Progress Notes (Signed)
Pt HR in 130s and goes up to 140s, even 150s. RR 30-40s on the ventilator. RT notified and she gave a breathing treatment and place the pt in chest PT using the ICU bed. Pt suctioned several times, getting out moderate amounts of mucus. At 1445, PRN dose of metoprolol given and HR decreased to 110s to 120s then go back up to the 130s, 140s and 150s. ICU MD notified. Gave new orders for one time order for IV metoprolol 5mg . Metoprolol 5 mg given. HR decreased to 110s to 120s before increasing to the 130s, 140s. Titrated sedation up. Will continue to monitor.

## 2018-02-21 NOTE — Progress Notes (Signed)
Progress Note  Patient Name: Adam Roberson [facility] Date of Encounter: 02/21/2018  Primary Cardiologist: [place] intubated and sedated. Became tachycardic around 2 AM in Afib with RVR with ventricular rates into the 190s bpm. He was getting a bath at that time. No family present.   Inpatient Medications    Scheduled Meds: . aspirin  81 mg Per NG tube Daily  . atorvastatin  20 mg Per NG tube q1800  . chlorhexidine gluconate (MEDLINE KIT)  15 mL Mouth Rinse BID  . digoxin  0.125 mg Per NG tube Daily  . docusate  100 mg Per Tube BID  . feeding supplement (PRO-STAT SUGAR FREE 64)  30 mL Per Tube Daily  . feeding supplement (VITAL 1.5 CAL)  1,000 mL Per Tube Q24H  . insulin aspart  0-9 Units Subcutaneous Q4H  . mouth rinse  15 mL Mouth Rinse 10 times per day  . metoprolol tartrate  25 mg Oral BID  . multivitamin  15 mL Per Tube Daily  . pantoprazole sodium  40 mg Per Tube Daily  . QUEtiapine  25 mg Per Tube BID   Continuous Infusions: . dexmedetomidine (PRECEDEX) IV infusion 0.4 mcg/kg/hr (02/21/18 0824)  . dextrose Stopped (02/20/18 0517)  . fentaNYL infusion INTRAVENOUS 150 mcg/hr (02/21/18 0824)  . heparin 1,000 Units/hr (02/21/18 0824)  . norepinephrine (LEVOPHED) Adult infusion    . phenylephrine (NEO-SYNEPHRINE) Adult infusion    . propofol (DIPRIVAN) infusion Stopped (02/15/18 1150)   PRN Meds: acetaminophen **OR** acetaminophen, fentaNYL, fentaNYL (SUBLIMAZE) injection, metoprolol tartrate, ondansetron **OR** ondansetron (ZOFRAN) IV, sodium chloride flush   Vital Signs    Vitals:   02/21/18 0500 02/21/18 0600 02/21/18 0700 02/21/18 0800  BP: 99/60 100/63 105/61 100/63  Pulse: 99 91 96 96  Resp: (!) 25 (!) 24 (!) 27 (!) 24  Temp:    98.9 F (37.2 C)  TempSrc:    Oral  SpO2: 96% 95% 94% 95%  Weight: 65.3 kg     Height:        Intake/Output Summary (Last 24 hours) at 02/21/2018 0913 Last data filed at 02/21/2018 2992 Gross per 24 hour    Intake 814.71 ml  Output 1335 ml  Net -520.29 ml   Filed Weights   02/19/18 0500 02/20/18 0500 02/21/18 0500  Weight: 64.9 kg 64.6 kg 65.3 kg    Telemetry    Currently in Afib with RVR with ventricular rates in the low 100s bpm. Around 2 AM this morning he deevloped Afib with RVR with ventricular rates into the 190s bpm - Personally Reviewed  ECG    n/a - Personally Reviewed  Physical Exam   GEN: Intubated and sedated.   Neck: JVD difficult to assess secondary to support devices. Cardiac: Tachycardic, irregularly irregular, no murmurs, rubs, or gallops.  Respiratory: Diminished and coarse breath sounds bilaterally on anterior exam. Intubated.   GI: Soft, nontender, non-distended.   MS: No edema; No deformity. Neuro:  Intubated and sedated.  Psych: Intubated and sedated.  Labs    Chemistry Recent Labs  Lab 02/15/18 0352  02/19/18 0516 02/20/18 0521 02/21/18 0509  NA 140   < > 151* 148* 145  K 3.1*   < > 3.8 3.5 4.0  CL 107   < > 111 107 108  CO2 26   < > 33* 33* 30  GLUCOSE 141*   < > 196* 198* 208*  BUN 16   < > 40* 40* 39*  CREATININE 1.09   < > 0.83 0.84 0.96  CALCIUM 7.0*   < > 7.7* 7.4* 7.2*  PROT 5.5*  --   --   --   --   ALBUMIN 2.4*  --   --   --   --   AST 99*  --   --   --   --   ALT 28  --   --   --   --   ALKPHOS 59  --   --   --   --   BILITOT 0.4  --   --   --   --   GFRNONAA >60   < > >60 >60 >60  GFRAA >60   < > >60 >60 >60  ANIONGAP 7   < > _0 < > = values in this interval not displayed.     Hematology Recent Labs  Lab 02/19/18 0516 02/20/18 0521 02/21/18 0509  WBC 10.5 8.7 8.3  RBC 4.14* 3.73* 3.86*  HGB 12.2* 11.1* 11.3*  HCT 39.2 35.6* 36.8*  MCV 94.7 95.4 95.3  MCH 29.5 29.8 29.3  MCHC 31.1 31.2 30.7  RDW 14.2 14.2 14.2  PLT 160 167 156    Cardiac Enzymes Recent Labs  Lab 02/14/18 1802 02/16/18 0410  TROPONINI 22.15* 17.50*   No results for input(s): TROPIPOC in the last 168 hours.   BNPNo results for  input(s): BNP, PROBNP in the last 168 hours.   DDimer No results for input(s): DDIMER in the last 168 hours.   Radiology    Dg Chest Port 1 View  Result Date: 02/20/2018 IMPRESSION: No significant change since one day prior. Congestive heart failure, with small right pleural effusion and bibasilar airspace disease. Electronically Signed   By: Abigail Miyamoto M.D.   On: 02/20/2018 10:35    Cardiac Studies   Echo 02/19/2018: Study Conclusions  - Left ventricle: The cavity size was normal. There was mild concentric hypertrophy. Systolic function was moderately reduced. The estimated ejection fraction was in the range of 35% to 40%. Akinesis of the mid-apicalanteroseptal, anterior, inferior, inferoseptal, and apical myocardium. - Aortic valve: There was mild regurgitation. - Mitral valve: Calcified annulus. There was moderate regurgitation. - Left atrium: The atrium was mildly dilated. - Tricuspid valve: There was moderate regurgitation. - Pulmonary arteries: Systolic pressure was mildly to moderately increased. PA peak pressure: 44 mm Hg (S).  Impressions:  - Wall motion abnormalities suggestive of stress induced cardiomyopathy vs. LAD infarct.  Patient Profile     84 y.o. male with history of CAD s/p 3-vessel CABG in [place], permanent Afib on Coumadin, HFpEF, CVA, DVT, HTN, HLD dementia, and Parkinson's disease who we are asked to evaluate for elevated troponin in the setting of acute respiratory failure with hypoxia in the setting of suspected ARDS/PNA/acute systolic and acute on chronic diastolic CHF.  Assessment & Plan    1. NSTEMI: -Patient remains intubated and sedated -Troponin peaked at 25 the prior week -Concern for acute MI vs stress-induced CM complicated by ICM -No indication for catheterization at this time -Continue medical therapy with ASA, Lipitor, and metoprolol -Readdress cardiac if/when he is weaned from the ventilator   2. Permanent Afib  with RVR: -When not agitated, ventricular rates are reasonably controlled -Remains on metoprolol and digoxin for rate control -Check digoxin level in the next 24-48 hours (order placed for 12/10) -Continue heparin gtt -Long term will need [place] -Maintain a potassium of 4.0 -Magnesium at goal  3. Acute systolic CHF: -He appears grossly euvolemic on exam -Diuresis is on hold given hypernatremia with D5 started by PCCM with careful monitoring of volume status given his cardiomyopathy  -Continue low-dose metoprolol with plans to transition to long-acting metoprolol prior to discharge if able given his cardiomyopathy -Unable to escalate further evidence-based heart failure therapy at this time in the setting of soft BP  4. Acute hypoxic respiratory failure: -Remains intubated with difficulty in weaning due to agitation  -Per PCCM   For questions or updates, please contact [place] Please consult [email or web address] for contact info under Cardiology/STEMI.    Signed, Christell Faith, PA-C [facility] Pager: [phone] 02/21/2018, 9:13 AM

## 2018-02-21 NOTE — Progress Notes (Signed)
Follow up - Critical Care Medicine Note  Patient Details:    Adam Roberson is an 84 y.o. male.  Adam Roberson is an 84 y.o. male.  With a past medical history remarkable for coronary artery disease, hypertension, atrial fibrillation, diastolic heart failure, presented to the emergency department via EMS secondary to aggressive respiratory failure with hypoxemia.  Patient was subsequently intubated and admitted to the intensive care unit. Was treated for respiratory failure and pneumonia.  Also found to have NSTEMI   Lines, Airways, Drains: Airway 8 mm (Active)  Secured at (cm) 22 cm 02/21/2018  9:32 PM  Measured From Lips 02/21/2018  9:32 PM  Secured Location Center 02/21/2018  9:32 PM  Secured By [employer] T[employer] Holder 02/21/2018  9:32 PM  Tube Holder Repositioned Yes 02/21/2018  9:32 PM  Cuff Pressure (cm H2O) 19 cm H2O 02/21/2018  9:32 PM  Site Condition Dry 02/21/2018  9:32 PM     PICC Triple Lumen 02/15/18 PICC Right Basilic 36 cm 0 cm (Active)  Indication for Insertion or Continuance of Line Prolonged intravenous therapies 02/21/2018  8:00 PM  Exposed Catheter (cm) 0 cm 02/15/2018  9:15 AM  Site Assessment Bleeding;Intact 02/21/2018  8:00 PM  Lumen #1 Status Infusing 02/21/2018  8:00 PM  Lumen #2 Status Infusing 02/21/2018  8:00 PM  Lumen #3 Status Infusing 02/21/2018  8:00 PM  Dressing Type Transparent 02/21/2018  8:00 PM  Dressing Status Clean;Dry;Intact;Antimicrobial disc in place 02/21/2018  8:00 PM  Line Care Connections checked and tightened 02/21/2018  8:00 PM  Dressing Intervention Other (Comment) 02/20/2018  7:22 PM  Dressing Change Due 03/03/2018 02/21/2018  8:00 PM     NG/OG Tube Orogastric 18 Fr. Center mouth Xray;Aucultation Documented cm marking at nare/ corner of mouth 60 cm (Active)  Cm Marking at Nare/Corner of Mouth (if applicable) 60 cm 02/21/2018  8:00 PM  Site Assessment Clean;Dry;Intact 02/21/2018  8:00 PM  Ongoing Placement Verification No change in cm markings  or external length of tube from initial placement 02/21/2018  4:00 PM  Status Infusing tube feed 02/21/2018  8:00 PM  Amount of suction 80 mmHg 02/14/2018  4:00 PM  Drainage Appearance None 02/21/2018  4:00 PM  Intake (mL) 100 mL 02/18/2018  5:47 PM     Urethral Catheter April RN Straight-tip 16 Fr. (Active)  Indication for Insertion or Continuance of Catheter Other (comment) 02/21/2018  8:00 PM  Site Assessment Clean;Intact 02/21/2018  8:00 PM  Catheter Maintenance Bag below level of bladder;Drainage bag/tubing not touching floor;Catheter secured;No dependent loops;Seal intact 02/21/2018  8:00 PM  Collection Container Standard drainage bag 02/21/2018  8:00 PM  Securement Method Securing device (Describe) 02/21/2018  8:00 PM  Urinary Catheter Interventions Unclamped 02/21/2018  4:00 PM  Output (mL) 125 mL 02/21/2018  8:00 PM    Anti-infectives:  Anti-infectives (From admission, onward)   Start     Dose/Rate Route Frequency Ordered Stop   12/11/17 1800  ceFEPIme (MAXIPIME) 2 g in sodium chloride 0.9 % 100 mL IVPB     2 g 200 mL/hr over 30 Minutes Intravenous Every 12 hours 12/11/17 0654 02/17/18 1801   12/11/17 1600  vancomycin (VANCOCIN) IVPB 1000 mg/200 mL premix  Status:  Discontinued     1,000 mg 200 mL/hr over 60 Minutes Intravenous Every 18 hours 12/11/17 0831 02/14/18 1031   12/11/17 0600  oseltamivir (TAMIFLU) capsule 75 mg  Status:  Discontinued     75 mg Oral  Once 12/11/17 0555 12/11/17 0758  03/11/18 0530  ceFEPIme (MAXIPIME) 2 g in sodium chloride 0.9 % 100 mL IVPB     2 g 200 mL/hr over 30 Minutes Intravenous  Once Mar 11, 2018 0524 03-11-18 0636   03-11-18 0530  metroNIDAZOLE (FLAGYL) IVPB 500 mg  Status:  Discontinued     500 mg 100 mL/hr over 60 Minutes Intravenous Every 8 hours 03/11/18 0524 03-11-2018 1109   03/11/2018 0530  vancomycin (VANCOCIN) IVPB 1000 mg/200 mL premix     1,000 mg 200 mL/hr over 60 Minutes Intravenous  Once March 11, 2018 0524 11-Mar-2018 0859       Microbiology: Results for orders placed or performed during the hospital encounter of 2018/03/11  Blood Culture (routine x 2)     Status: None   Collection Time: 03/11/18  5:27 AM  Result Value Ref Range Status   Specimen Description BLOOD RIGHT HAND  Final   Special Requests   Final    BOTTLES DRAWN AEROBIC AND ANAEROBIC Blood Culture adequate volume   Culture   Final    NO GROWTH 5 DAYS Performed at [facility], [address], [place], [place] [address]    Report Status 02/18/2018 FINAL  Final  Blood Culture (routine x 2)     Status: None   Collection Time: 03/11/18  5:27 AM  Result Value Ref Range Status   Specimen Description BLOOD LEFT FOREARM  Final   Special Requests   Final    BOTTLES DRAWN AEROBIC AND ANAEROBIC Blood Culture adequate volume   Culture   Final    NO GROWTH 5 DAYS Performed at [facility], [address], [place], [place] [address]    Report Status 02/18/2018 FINAL  Final  Urine culture     Status: None   Collection Time: 03-11-18  5:27 AM  Result Value Ref Range Status   Specimen Description   Final    URINE, RANDOM Performed at [facility], [address], [place], [place] [address]    Special Requests   Final    NONE Performed at [facility], [address], [place], [place] [address]    Culture   Final    NO GROWTH Performed at [facility], [address], [place], [place] [address]    Report Status 02/14/2018 FINAL  Final  MRSA PCR Screening     Status: None   Collection Time: 03/11/18  8:32 AM  Result Value Ref Range Status   MRSA by PCR NEGATIVE NEGATIVE Final    Comment:        The GeneXpert MRSA Assay (FDA approved for NASAL specimens only), is one component of a comprehensive MRSA colonization surveillance program. It is not intended to diagnose MRSA infection nor to guide or monitor treatment for MRSA infections. Performed at [facility], [address]  [place], [place] [address]   Urine Culture     Status: None   Collection Time: 02/18/18  5:56 PM  Result Value Ref Range Status   Specimen Description   Final    URINE, RANDOM Performed at [facility], [address] [place], [place] [address]    Special Requests   Final    NONE Performed at [facility], [address], [place], [place] [address]    Culture   Final    NO GROWTH Performed at [facility], [address], [place], [place] [address]    Report Status 02/20/2018 FINAL  Final  CULTURE, BLOOD (ROUTINE X  2) w Reflex to ID Panel     Status: None (Preliminary result)   Collection Time: 02/18/18  9:19 PM  Result Value Ref Range Status   Specimen Description BLOOD RIGHT HAND  Final   Special Requests   Final    BOTTLES DRAWN AEROBIC AND ANAEROBIC Blood Culture adequate volume   Culture   Final    NO GROWTH 3 DAYS Performed at [facility], [address], [place], [place] [address]    Report Status PENDING  Incomplete  Culture, respiratory (non-expectorated)     Status: None   Collection Time: 02/18/18  9:21 PM  Result Value Ref Range Status   Specimen Description   Final    TRACHEAL ASPIRATE Performed at [facility], [address], [place], [place] [address]    Special Requests   Final    NONE Performed at [facility], [address] [place], [place] [address]    Gram Stain   Final    ABUNDANT WBC PRESENT,BOTH PMN AND MONONUCLEAR RARE GRAM POSITIVE COCCI RARE GRAM VARIABLE ROD Performed at [facility], [address] [place], [place] [address]    Culture FEW METHICILLIN RESISTANT STAPHYLOCOCCUS AUREUS  Final   Report Status 02/21/2018 FINAL  Final   Organism ID, Bacteria METHICILLIN RESISTANT STAPHYLOCOCCUS AUREUS  Final      Susceptibility   Methicillin resistant staphylococcus aureus - MIC*    CIPROFLOXACIN <=0.5 SENSITIVE Sensitive     ERYTHROMYCIN <=0.25 SENSITIVE Sensitive     GENTAMICIN  <=0.5 SENSITIVE Sensitive     OXACILLIN RESISTANT Resistant     TETRACYCLINE <=1 SENSITIVE Sensitive     VANCOMYCIN <=0.5 SENSITIVE Sensitive     TRIMETH/SULFA <=10 SENSITIVE Sensitive     CLINDAMYCIN <=0.25 SENSITIVE Sensitive     RIFAMPIN <=0.5 SENSITIVE Sensitive     Inducible Clindamycin NEGATIVE Sensitive     * FEW METHICILLIN RESISTANT STAPHYLOCOCCUS AUREUS  CULTURE, BLOOD (ROUTINE X 2) w Reflex to ID Panel     Status: None (Preliminary result)   Collection Time: 02/18/18  9:31 PM  Result Value Ref Range Status   Specimen Description BLOOD RIGHT WRIST  Final   Special Requests   Final    BOTTLES DRAWN AEROBIC AND ANAEROBIC Blood Culture adequate volume   Culture   Final    NO GROWTH 3 DAYS Performed at [facility], [address], [place], [place] [address]    Report Status PENDING  Incomplete  Culture, respiratory (non-expectorated)     Status: None (Preliminary result)   Collection Time: 02/21/18  5:16 AM  Result Value Ref Range Status   Specimen Description   Final    TRACHEAL ASPIRATE Performed at [facility], [address], [place], [place] [address]    Special Requests   Final    NONE Performed at [facility], [address] [place], [place] [address]    Gram Stain   Final    FEW WBC PRESENT, PREDOMINANTLY PMN MODERATE GRAM POSITIVE COCCI Performed at [facility], [address] [place], [place] [address]    Culture PENDING  Incomplete   Report Status PENDING  Incomplete    Best Practice/Protocols:  On DVT/GI prophylaxis.  Events:   Studies: Dg Abd 1 View  Result Date: 02/19/2018 CLINICAL DATA:  Aspirated gastric contents. EXAM: ABDOMEN - 1 VIEW COMPARISON:  Chest x-ray dated February 13, 2018. FINDINGS: NG tube tip in the distal stomach. Nonobstructive bowel gas pattern.  No acute osseous abnormality. IMPRESSION: 1. NG tube in the stomach.  No acute findings. Electronically Signed   By: Obie Dredge M.D.   On:  02/19/2018 09:24   Ct Angio Chest Pe W Or Wo Contrast  Result Date: 02/17/2018 CLINICAL DATA:  Decreased oxygen saturation requiring intubation. EXAM: CT ANGIOGRAPHY CHEST WITH CONTRAST TECHNIQUE: Multidetector CT imaging of the chest was performed using the standard protocol during bolus administration of intravenous contrast. Multiplanar CT image reconstructions and MIPs were obtained to evaluate the vascular anatomy. CONTRAST:  75mL OMNIPAQUE IOHEXOL 350 MG/ML SOLN COMPARISON:  Chest radiograph 03/10/2018 FINDINGS: Cardiovascular: Satisfactory opacification of the pulmonary arteries to the segmental level. No evidence of pulmonary embolism. Enlarged heart. No pericardial effusion. Calcific atherosclerotic disease of the coronary arteries and aorta. Annular calcifications of the mitral valve. Mediastinum/Nodes: Borderline enlarged bilateral mediastinal lymph nodes. The patient is intubated. Main bronchi are patent. Enteric catheter within the esophagus, terminates in the distal gastric body. Lungs/Pleura: Moderate right and small left pleural effusions. Mild interstitial pulmonary edema. Scattered ground-glass opacities throughout both lungs, with more confluent appearance in the lower lobes, left greater than right. Upper Abdomen: Right renal cyst. 18 mm hypoattenuated nodule within the spleen, likely benign. Musculoskeletal: Findings of diffuse idiopathic skeletal hyperostosis of the thoracic spine Review of the MIP images confirms the above findings. IMPRESSION: Enlarged heart with interstitial pulmonary edema and bilateral pleural effusions. Superimposed patchy airspace consolidation in both lungs, more confluent in the lower lobes, suspicious for multifocal pneumonia. Borderline mediastinal lymphadenopathy, likely reactive. Aortic Atherosclerosis (ICD10-I70.0). Electronically Signed   By: Ted Mcalpine M.D.   On: 03/11/2018 12:50   Dg Chest Port 1 View  Result Date: 02/20/2018 CLINICAL DATA:   Acute respiratory failure EXAM: PORTABLE CHEST 1 VIEW COMPARISON:  One day prior FINDINGS: Endotracheal tube terminates 3.6 cm above carina.Nasogastric tube extends beyond the inferior aspect of the film. Right-sided PICC line tip at superior caval/atrial junction. Prior median sternotomy. Normal heart size. Small right pleural effusion remains. Interstitial prominence and indistinctness, similar. Bibasilar airspace disease is not significantly changed. IMPRESSION: No significant change since one day prior. Congestive heart failure, with small right pleural effusion and bibasilar airspace disease. Electronically Signed   By: Jeronimo Greaves M.D.   On: 02/20/2018 10:35   Dg Chest Port 1 View  Result Date: 02/19/2018 CLINICAL DATA:  Acute respiratory failure. EXAM: PORTABLE CHEST 1 VIEW COMPARISON:  Chest x-ray dated February 18, 2018. FINDINGS: Unchanged endotracheal and enteric tubes. Unchanged right upper extremity PICC line. Stable cardiomediastinal silhouette and diffusely increased interstitial markings. Unchanged small bilateral pleural effusions and bibasilar atelectasis. No pneumothorax. No acute osseous abnormality. IMPRESSION: Stable pulmonary edema and small bilateral pleural effusions. Electronically Signed   By: Obie Dredge M.D.   On: 02/19/2018 09:21   Dg Chest Port 1 View  Result Date: 02/19/2018 CLINICAL DATA:  Aspiration. EXAM: PORTABLE CHEST 1 VIEW COMPARISON:  02/19/2018 at 0545 hours FINDINGS: 0757 hours. Endotracheal tube terminates 2.6 cm above carina.Nasogastric tube extends beyond the inferior aspect of the film. Right-sided PICC line tip at low SVC. Normal heart size for level of inspiration. Atherosclerosis in the transverse aorta. Probable small bilateral pleural effusions. No pneumothorax. Moderate interstitial thickening is similar. Left greater than right base airspace disease. IMPRESSION: No significant change since earlier in the day. Bibasilar airspace disease, which could  represent infection or aspiration. Concurrent interstitial thickening and small bilateral pleural effusions, suggesting congestive heart failure. Aortic Atherosclerosis (ICD10-I70.0). Electronically Signed   By: Ronaldo Miyamoto  Reche Dixon M.D.   On: 02/19/2018 08:37   Dg Chest Port 1 View  Result Date: 02/18/2018 CLINICAL DATA:  Respiratory failure EXAM: PORTABLE CHEST 1 VIEW COMPARISON:  02/17/2018 and prior studies FINDINGS: An endotracheal tube with tip 2 cm above the carina, NG tube entering the stomach with tip off the field of view and RIGHT PICC line with tip overlying the SUPERIOR cavoatrial junction again noted. Cardiomediastinal silhouette is unchanged. CABG changes again identified. Bilateral interstitial opacities/edema again identified with bilateral pleural effusions. No pneumothorax. IMPRESSION: Unchanged appearance of the chest with pulmonary edema and pleural effusions. Electronically Signed   By: Harmon Pier M.D.   On: 02/18/2018 10:50   Dg Chest Port 1 View  Result Date: 02/17/2018 CLINICAL DATA:  Mechanically assisted ventilation. EXAM: PORTABLE CHEST 1 VIEW COMPARISON:  02/16/2018. FINDINGS: Endotracheal tube again terminates approximately 5 mm above the carina. Proximal repositioning of approximately 2-3 cm again suggested. Interim placement of right PICC line, its tip is over the right atrium. NG tube noted with tip below left hemidiaphragm. Prior CABG. Cardiomegaly with bilateral pulmonary infiltrates/edema again noted. Small bilateral pleural effusions again noted. Findings suggest congestive heart failure. No pneumothorax. IMPRESSION: 1. Endotracheal tube tip again noted 5 mm above the carina, proximal repositioning of approximately 2 3 cm again suggested. 2. Interim placement of PICC line. Its tip is over the right atrium. 3. Prior CABG. Findings consistent with congestive heart failure bilateral pulmonary edema and bilateral pleural effusions again noted. These results will be called to the  ordering clinician or representative by the Radiologist Assistant, and communication documented in the PACS or zVision Dashboard. Electronically Signed   By: Maisie Fus  Register   On: 02/17/2018 13:00   Dg Chest Port 1 View  Result Date: 02/16/2018 CLINICAL DATA:  Respiratory distress.  Patient on ventilation. EXAM: PORTABLE CHEST 1 VIEW COMPARISON:  February 15, 2018 FINDINGS: The ETT terminates slightly above the carina, 5 mm. An NG tube terminates below today's film. A right PICC line terminates in the SVC. Diffuse interstitial opacities. Bilateral effusions. More focal infiltrate in the left base. Stable cardiomediastinal silhouette. IMPRESSION: 1. The ETT terminates slightly above the carina, 5 mm. Recommend withdrawing 2 cm. 2. Other support apparatus as above. 3. Diffuse interstitial opacities and bilateral effusions. 4. More focal infiltrate in the left base. These results will be called to the ordering clinician or representative by the Radiologist Assistant, and communication documented in the PACS or zVision Dashboard. Electronically Signed   By: Gerome Sam III M.D   On: 02/16/2018 14:22   Dg Chest Port 1 View  Result Date: 02/15/2018 CLINICAL DATA:  PICC Line placement EXAM: PORTABLE CHEST 1 VIEW COMPARISON:  02/15/2018 FINDINGS: Interval placement of RIGHT-sided PICC line, tip overlying the superior vena cava. Endotracheal tube is in place with tip approximately 1 centimeter above the carina. A nasogastric tube is in place, tip off the image beyond the gastroesophageal junction. The heart is enlarged and stable in configuration. There are patchy infiltrates in the lungs bilaterally. Bilateral pleural effusions are stable. IMPRESSION: Interval placement of RIGHT-sided PICC line, tip to the superior vena cava. Stable bilateral pleural effusions and parenchymal opacities. Electronically Signed   By: Norva Pavlov M.D.   On: 02/15/2018 09:40   Dg Chest Port 1 View  Result Date:  02/15/2018 CLINICAL DATA:  Acute respiratory failure EXAM: PORTABLE CHEST 1 VIEW COMPARISON:  02/19/2018 FINDINGS: Cardiac shadow is stable. Endotracheal tube is noted at the level of the carina. This could  be withdrawn 2-3 cm. Nasogastric catheter extends into the stomach. Vascular congestion and increasing pleural effusions are seen. Bibasilar atelectasis/infiltrate is noted as well. IMPRESSION: Endotracheal tube at the level of the carina. This could be withdrawn 2-3 cm. Persistent changes of CHF with increasing effusions bilaterally. Bibasilar opacities consistent with atelectasis or infiltrate. Electronically Signed   By: Alcide Clever M.D.   On: 02/15/2018 07:26   Dg Chest Port 1 View  Result Date: 03-09-2018 CLINICAL DATA:  Initial evaluation for sepsis, post intubation. EXAM: PORTABLE CHEST 1 VIEW COMPARISON:  Prior radiograph from 08/17/2017 FINDINGS: Endotracheal tube in place with tip position approximately 15 mm above the carina. Enteric tube courses into the abdomen with side hole beyond the GE junction. Median sternotomy wires underlying surgical clips noted, stable. Cardiomegaly unchanged. Mediastinal silhouette normal. Aortic atherosclerosis. Lungs are mildly hypoinflated. Diffuse pulmonary vascular and interstitial congestion, suggesting pulmonary interstitial edema. There are superimposed confluent multifocal opacities at the right mid and lower lung as well as the left lung base, suspicious for infiltrates. Associated small right pleural effusion. No pneumothorax. No acute osseus abnormality. IMPRESSION: 1. Tip of the endotracheal tube approximately 15 mm above the carina. 2. Cardiomegaly with diffuse pulmonary vascular and interstitial congestion, suggesting pulmonary interstitial edema. 3. Superimposed multifocal opacities at the right mid and lower lung and left lung base, suspicious for infiltrates. 4. Small right pleural effusion. Electronically Signed   By: Rise Mu M.D.   On:  2018/03/09 05:57   [place] Ekg Site Rite  Result Date: 02/14/2018 If Site Rite image not attached, placement could not be confirmed due to current cardiac rhythm.   Consults: Treatment Team:  Iran Ouch, MD   Subjective:    Overnight Issues:  Erratic blood pressure control.  Also erratic heart rate control.  Patient does not tolerate spontaneous breathing trials.  Unresponsive on wake-up assessments. Objective:  Vital signs for last 24 hours: Temp:  [97.7 F (36.5 C)-102.9 F (39.4 C)] 102.9 F (39.4 C) (12/09 2000) Pulse Rate:  [30-145] 74 (12/09 2000) Resp:  [24-38] 32 (12/09 2000) BP: (86-119)/(57-89) 86/60 (12/09 2000) SpO2:  [91 %-97 %] 96 % (12/09 2000) FiO2 (%):  [28 %] 28 % (12/09 2132) Weight:  [65.3 kg] 65.3 kg (12/09 0500)  Hemodynamic parameters for last 24 hours:    Intake/Output from previous day: 12/08 0701 - 12/09 0700 In: 805 [I.V.:805] Out: 1210 [Urine:1210]  Intake/Output this shift: Total I/O In: 161.8 [I.V.:161.8] Out: 125 [Urine:125]  Vent settings for last 24 hours: Vent Mode: PRVC FiO2 (%):  [28 %] 28 % Set Rate:  [16 bmp] 16 bmp Vt Set:  [400 mL] 400 mL PEEP:  [5 cmH20] 5 cmH20 Plateau Pressure:  [30 cmH20] 30 cmH20  Physical Exam:   General: Unresponsive, intubated on the ventilator.  Synchronous with the ventilator. HEENT:           Patient is orally intubated, trachea midline, no thyromegaly appreciated CV:                  Irregularly iregular rhythm with rapid ventricular response Pulmonary:      Coarse rhonchi and rales appreciated diffusely Abdominal:      Positive bowel sounds, soft exam Extremities:     No clubbing, cyanosis or edema noted Neurologic:      Limited exam as patient is on mechanical ventilation  Assessment/Plan:    LOS: 8 days   Additional comments: Palliative care discussing end-of-life issues with family.  Assessment/Plan:  1.  Hypoxemic respiratory failure.  Multifactorial etiology to include  decompensated heart failure, bilateral effusions and interstitial edema noted on CT scan and chest x-ray.  Patient has required intermittent diuresis, has completed a course of antibiotics and cefepime has been stopped.  Not tolerating weaning trials.  2.  NSTEMI.  Peak troponin 25 appreciate cardiology assistance, on aspirin, statin and heparin.  No ischemic changes appreciated on EKG. Echocardiogram performed revealed reduced ejection fraction at 35 to 40% with significant hypokinesia consistent with stress-induced cardiomyopathy versus LAD infarction.  Given patient's poor response suspect it is most related to LAD infarction. Additional medicines have been limited secondary to marginal blood pressure, erratic control of atrial fibrillation due to poor hemodynamic tolerance.  3.  Hypernatremia.  Resolved.  4.  Acute kidney injury likely due to ATN versus cardiorenal syndrome.  This issue adds complexity to his management.  5.  Hyperglycemia.     Sliding scale insulin coverage  6.  We will try again spontaneous awakening and breathing trialin the morning   Favor transitioning patient to comfort care given his overall poor prognosis.  Critical Care Total Time*: 40 minutes  Sarina Ser 02/21/2018  *Care during the described time interval was provided by me and/or other providers on the critical care team.  I have reviewed this patient's available data, including medical history, events of note, physical examination and test results as part of my evaluation.

## 2018-02-21 NOTE — Progress Notes (Addendum)
MEDICATION RELATED CONSULT NOTE - ELECTROLYTE MANAGEMENT   Pharmacy Consult for Electrolyte Replacement Indication: Hypokalemia  No Known Allergies   Labs: BMET    Component Value Date/Time   NA 145 02/21/2018 0509   NA 138 08/04/2017 1145   NA 143 12/22/2011 1844   K 6.6 (HH) 02/21/2018 2230   K 4.2 12/22/2011 1844   CL 108 02/21/2018 0509   CL 106 12/22/2011 1844   CO2 30 02/21/2018 0509   CO2 30 12/22/2011 1844   GLUCOSE 208 (H) 02/21/2018 0509   GLUCOSE 93 12/22/2011 1844   BUN 39 (H) 02/21/2018 0509   BUN 12 08/04/2017 1145   BUN 13 12/22/2011 1844   CREATININE 0.96 02/21/2018 0509   CREATININE 1.03 12/22/2011 1844   CALCIUM 7.2 (L) 02/21/2018 0509   CALCIUM 8.5 12/22/2011 1844   GFRNONAA >60 02/21/2018 0509   GFRNONAA >60 12/22/2011 1844   GFRAA >60 02/21/2018 0509   GFRAA >60 12/22/2011 1844    Estimated Creatinine Clearance: 48.9 mL/min (by C-G formula based on SCr of 0.96 mg/dL).   Assessment: Patient is an 84 yo male admitted for NSTEMI. Noted to be hypokalemic 12/6 morning. Pharmacy consulted for Electrolyte management.  Furosemide 20 mg IV BID - stopped 12/7  Goal of Therapy:  Maintain K ~ 4.0 and Mag ~ 2.0  Plan:  12/09 @ 2200 K 6.6, patient received 170 mEq of KCI over past 2 days, K has been trending up; however, will confirm if this is real or just hemolysis. Ordered a stat bmp and will reassess when that results. No recent EKG to assess, patient here for NSTEMI.  12/10 @ 0200 K 5.5 patient is hyperkalemic and received IV insulin aspart 10 units x 2. Will continue to monitor.  Adam Roberson  Shakti Fleer, PharmD, BCPS Clinical Pharmacist 02/21/2018 11:55 PM

## 2018-02-21 NOTE — Progress Notes (Signed)
Patient ID: Adam Roberson, male   DOB: 12/08/1928, 84 y.o.   MRN: [id number]  [facility] CVE:[id number] DOB: 06-Feb-1929 DOA: 02/16/2018 PCP: Katheren Shams  HPI/Subjective: Patient intubated and sedated  Objective: Vitals:   02/21/18 1300 02/21/18 1400  BP: 109/70 99/69  Pulse: (!) 30 89  Resp: (!) 30 (!) 30  Temp: 99.3 F (37.4 C)   SpO2: 97% 94%    Intake/Output Summary (Last 24 hours) at 02/21/2018 1425 Last data filed at 02/21/2018 1309 Gross per 24 hour  Intake 867.25 ml  Output 1320 ml  Net -452.75 ml   Filed Weights   02/19/18 0500 02/20/18 0500 02/21/18 0500  Weight: 64.9 kg 64.6 kg 65.3 kg    ROS: Review of Systems  Unable to perform ROS: Intubated   Exam: Physical Exam  Constitutional: He is intubated.  HENT:  Nose: No mucosal edema.  Unable to look into mouth  Eyes: Conjunctivae and lids are normal.  Pupils pinpoint  Neck: Carotid bruit is not present.  Cardiovascular: S1 normal and S2 normal. An irregularly irregular rhythm present.  Murmur heard.  Systolic murmur is present with a grade of 2/6. Respiratory: No accessory muscle usage. He is intubated. He has decreased breath sounds in the right lower field and the left lower field. He has no wheezes. He has rhonchi in the right lower field and the left lower field. He has no rales.  GI: Soft. Bowel sounds are normal. There is no tenderness.  Musculoskeletal:       Right ankle: He exhibits no swelling.       Left ankle: He exhibits no swelling.  Lymphadenopathy:    He has no cervical adenopathy.  Neurological:  Intubated and sedated  Skin: Skin is warm. No rash noted. Nails show clubbing.  Psychiatric:  Intubated and sedated unable to assess      Data Reviewed: Basic Metabolic Panel: Recent Labs  Lab 02/15/18 0349  02/18/18 0329 02/18/18 1341 02/18/18 2119 02/19/18 0516 02/20/18 0521 02/21/18 0509  NA  --    < > 147* 148*  --  151*  148* 145  K  --    < > 3.0* 2.7* 3.4* 3.8 3.5 4.0  CL  --    < > 109 107  --  111 107 108  CO2  --    < > 32 35*  --  33* 33* 30  GLUCOSE  --    < > 192* 188*  --  196* 198* 208*  BUN  --    < > 40* 44*  --  40* 40* 39*  CREATININE  --    < > 0.87 0.86  --  0.83 0.84 0.96  CALCIUM  --    < > 7.7* 7.7*  --  7.7* 7.4* 7.2*  MG 2.0  --   --  2.0  --  2.2  --  2.2  PHOS 2.5  --   --   --   --   --   --   --    < > = values in this interval not displayed.   Liver Function Tests: Recent Labs  Lab 02/15/18 0352  AST 99*  ALT 28  ALKPHOS 59  BILITOT 0.4  PROT 5.5*  ALBUMIN 2.4*   CBC: Recent Labs  Lab 02/17/18 0645 02/18/18 0329 02/19/18 0516 02/20/18 0521 02/21/18 0509  WBC 9.2 8.9 10.5 8.7 8.3  HGB 11.9* 11.7* 12.2* 11.1* 11.3*  HCT 37.1* 36.1* 39.2 35.6* 36.8*  MCV 91.6 91.9 94.7 95.4 95.3  PLT 152 173 160 167 156   Cardiac Enzymes: Recent Labs  Lab 02/14/18 1802 02/16/18 0410  TROPONINI 22.15* 17.50*   BNP (last 3 results) Recent Labs    05/23/17 0147 08/17/17 0003  BNP 187.0* 242.0*    CBG: Recent Labs  Lab 02/20/18 1930 02/21/18 0013 02/21/18 0341 02/21/18 0822 02/21/18 1232  GLUCAP 135* 184* 165* 195* 198*    Recent Results (from the past 240 hour(s))  Blood Culture (routine x 2)     Status: None   Collection Time: 03/12/2018  5:27 AM  Result Value Ref Range Status   Specimen Description BLOOD RIGHT HAND  Final   Special Requests   Final    BOTTLES DRAWN AEROBIC AND ANAEROBIC Blood Culture adequate volume   Culture   Final    NO GROWTH 5 DAYS Performed at [facility], [address], [place], [place] [address]    Report Status 02/18/2018 FINAL  Final  Blood Culture (routine x 2)     Status: None   Collection Time: 03/09/2018  5:27 AM  Result Value Ref Range Status   Specimen Description BLOOD LEFT FOREARM  Final   Special Requests   Final    BOTTLES DRAWN AEROBIC AND ANAEROBIC Blood Culture adequate volume   Culture   Final    NO  GROWTH 5 DAYS Performed at [facility], [address] [place], [place] [address]    Report Status 02/18/2018 FINAL  Final  Urine culture     Status: None   Collection Time: 02/26/2018  5:27 AM  Result Value Ref Range Status   Specimen Description   Final    URINE, RANDOM Performed at [facility], [address], [place], [place] [address]    Special Requests   Final    NONE Performed at [facility], [address] [place], [place] [address]    Culture   Final    NO GROWTH Performed at [facility], [place] [address] [place], [place] [address]    Report Status 02/14/2018 FINAL  Final  MRSA PCR Screening     Status: None   Collection Time: 02/18/2018  8:32 AM  Result Value Ref Range Status   MRSA by PCR NEGATIVE NEGATIVE Final    Comment:        The GeneXpert MRSA Assay (FDA approved for NASAL specimens only), is one component of a comprehensive MRSA colonization surveillance program. It is not intended to diagnose MRSA infection nor to guide or monitor treatment for MRSA infections. Performed at [facility], [address] [place], [place] [address]   Urine Culture     Status: None   Collection Time: 02/18/18  5:56 PM  Result Value Ref Range Status   Specimen Description   Final    URINE, RANDOM Performed at [facility], [address], [place], [place] [address]    Special Requests   Final    NONE Performed at [facility], [address], [place], [place] [address]    Culture   Final    NO GROWTH Performed at [facility], [place] [address] [place], [place] [address]    Report Status 02/20/2018 FINAL  Final  CULTURE, BLOOD (ROUTINE X 2) w Reflex to ID Panel     Status: None (Preliminary result)   Collection Time: 02/18/18  9:19 PM  Result Value  Ref Range Status   Specimen Description BLOOD RIGHT HAND  Final   Special Requests   Final    BOTTLES DRAWN AEROBIC AND ANAEROBIC Blood Culture  adequate volume   Culture   Final    NO GROWTH 3 DAYS Performed at [facility], [address], [place], [place] [address]    Report Status PENDING  Incomplete  Culture, respiratory (non-expectorated)     Status: None   Collection Time: 02/18/18  9:21 PM  Result Value Ref Range Status   Specimen Description   Final    TRACHEAL ASPIRATE Performed at [facility], [address], [place], [place] [address]    Special Requests   Final    NONE Performed at [facility], [address] [place], [place] [address]    Gram Stain   Final    ABUNDANT WBC PRESENT,BOTH PMN AND MONONUCLEAR RARE GRAM POSITIVE COCCI RARE GRAM VARIABLE ROD Performed at [facility], [place] [address], [place], [place] [address]    Culture FEW METHICILLIN RESISTANT STAPHYLOCOCCUS AUREUS  Final   Report Status 02/21/2018 FINAL  Final   Organism ID, Bacteria METHICILLIN RESISTANT STAPHYLOCOCCUS AUREUS  Final      Susceptibility   Methicillin resistant staphylococcus aureus - MIC*    CIPROFLOXACIN <=0.5 SENSITIVE Sensitive     ERYTHROMYCIN <=0.25 SENSITIVE Sensitive     GENTAMICIN <=0.5 SENSITIVE Sensitive     OXACILLIN RESISTANT Resistant     TETRACYCLINE <=1 SENSITIVE Sensitive     VANCOMYCIN <=0.5 SENSITIVE Sensitive     TRIMETH/SULFA <=10 SENSITIVE Sensitive     CLINDAMYCIN <=0.25 SENSITIVE Sensitive     RIFAMPIN <=0.5 SENSITIVE Sensitive     Inducible Clindamycin NEGATIVE Sensitive     * FEW METHICILLIN RESISTANT STAPHYLOCOCCUS AUREUS  CULTURE, BLOOD (ROUTINE X 2) w Reflex to ID Panel     Status: None (Preliminary result)   Collection Time: 02/18/18  9:31 PM  Result Value Ref Range Status   Specimen Description BLOOD RIGHT WRIST  Final   Special Requests   Final    BOTTLES DRAWN AEROBIC AND ANAEROBIC Blood Culture adequate volume   Culture   Final    NO GROWTH 3 DAYS Performed at [facility], [address] [place],  [address]    Report Status  PENDING  Incomplete     Studies: Dg Chest Port 1 View  Result Date: 02/20/2018 CLINICAL DATA:  Acute respiratory failure EXAM: PORTABLE CHEST 1 VIEW COMPARISON:  One day prior FINDINGS: Endotracheal tube terminates 3.6 cm above carina.Nasogastric tube extends beyond the inferior aspect of the film. Right-sided PICC line tip at superior caval/atrial junction. Prior median sternotomy. Normal heart size. Small right pleural effusion remains. Interstitial prominence and indistinctness, similar. Bibasilar airspace disease is not significantly changed. IMPRESSION: No significant change since one day prior. Congestive heart failure, with small right pleural effusion and bibasilar airspace disease. Electronically Signed   By: Abigail Miyamoto M.D.   On: 02/20/2018 10:35    Scheduled Meds: . aspirin  81 mg Per NG tube Daily  . atorvastatin  20 mg Per NG tube q1800  . chlorhexidine gluconate (MEDLINE KIT)  15 mL Mouth Rinse BID  . digoxin  0.125 mg Per NG tube Daily  . docusate  100 mg Per Tube BID  . feeding supplement (PRO-STAT SUGAR FREE 64)  30 mL Per Tube Daily  . feeding supplement (VITAL 1.5 CAL)  1,000 mL Per Tube Q24H  . insulin aspart  0-9 Units Subcutaneous Q4H  . mouth rinse  15 mL Mouth Rinse 10 times per day  . metoprolol tartrate  25 mg Oral BID  . multivitamin  15 mL Per Tube Daily  . pantoprazole sodium  40 mg Per Tube Daily  . polyethylene glycol  17 g Per Tube Daily  . QUEtiapine  25 mg Per Tube BID  . sennosides  5 mL Per Tube BID   Continuous Infusions: . dexmedetomidine (PRECEDEX) IV infusion 0.4 mcg/kg/hr (02/21/18 1309)  . dextrose Stopped (02/20/18 0517)  . fentaNYL infusion INTRAVENOUS 150 mcg/hr (02/21/18 1309)  . heparin 1,000 Units/hr (02/21/18 1309)  . norepinephrine (LEVOPHED) Adult infusion    . phenylephrine (NEO-SYNEPHRINE) Adult infusion    . propofol (DIPRIVAN) infusion Stopped (02/15/18 1150)    Assessment/Plan:  1. Acute hypoxic respiratory failure.   Patient is currently intubated and sedated and on 28% FiO2.  Critical care team speaking with family about next steps..  Patient currently a full code.  Patient on fentanyl and Precedex for sedation. 2. NSTEMI.  Aspirin, metoprolol, heparin drip 3. Acute systolic congestive heart failure.  Intermittent diuresis limited with hypotension. 4. Atrial fibrillation on heparin drip and metoprolol and digoxin 5. Hypernatremia.  Critical care specialist starting D5W 6. History of Parkinson's disease. 7. Impaired fasting glucose.  On sliding scale coverage 8. Nutrition on tube feeds at 40 cc/h 9. MRSA positive in respiratory culture.  Leave up to the critical care team on antibiotics or not.  Code Status:     Code Status Orders  (From admission, onward)         Start     Ordered   03/09/2018 0830  Full code  Continuous     02/16/2018 0829        Code Status History    Date Active Date Inactive Code Status Order ID Comments User Context   08/18/2017 0340 08/20/2017 1945 Full Code [id number]  Harrie Foreman, MD Inpatient   06/28/2015 2331 07/05/2015 1644 Full Code [id number]  Gladstone Lighter, MD Inpatient     Family Communication: As per critical care specialist Disposition Plan: To be determined  Consultants:  Critical care specialist  Cardiology  Time spent: 24 minutes  [place]

## 2018-02-21 NOTE — Progress Notes (Addendum)
MEDICATION RELATED CONSULT NOTE - ELECTROLYTE MANAGEMENT   Pharmacy Consult for Electrolyte Replacement Indication: Hypokalemia  No Known Allergies   Labs: BMET    Component Value Date/Time   NA 145 02/21/2018 0509   NA 138 08/04/2017 1145   NA 143 12/22/2011 1844   K 4.0 02/21/2018 0509   K 4.2 12/22/2011 1844   CL 108 02/21/2018 0509   CL 106 12/22/2011 1844   CO2 30 02/21/2018 0509   CO2 30 12/22/2011 1844   GLUCOSE 208 (H) 02/21/2018 0509   GLUCOSE 93 12/22/2011 1844   BUN 39 (H) 02/21/2018 0509   BUN 12 08/04/2017 1145   BUN 13 12/22/2011 1844   CREATININE 0.96 02/21/2018 0509   CREATININE 1.03 12/22/2011 1844   CALCIUM 7.2 (L) 02/21/2018 0509   CALCIUM 8.5 12/22/2011 1844   GFRNONAA >60 02/21/2018 0509   GFRNONAA >60 12/22/2011 1844   GFRAA >60 02/21/2018 0509   GFRAA >60 12/22/2011 1844    Estimated Creatinine Clearance: 48.9 mL/min (by C-G formula based on SCr of 0.96 mg/dL).   Assessment: Patient is an 84 yo male admitted for NSTEMI. Noted to be hypokalemic 12/6 morning. Pharmacy consulted for Electrolyte management.  Furosemide 20 mg IV BID - stopped 12/7  Goal of Therapy:  Maintain K ~ 4.0 and Mag ~ 2.0  Plan:  K 2.7 Mg: 2.0. Will order KCL 40mEq per tube x 2 dose and KCL 10mEq IV x 2 doses.   Will continue to monitor labs and replace electrolytes as needed.    Gardner CandleSheema M Oleda Borski, PharmD, BCPS Clinical Pharmacist 02/21/2018 7:44 AM

## 2018-02-22 ENCOUNTER — Inpatient Hospital Stay: Payer: Medicaid Other

## 2018-02-22 DIAGNOSIS — Z515 Encounter for palliative care: Secondary | ICD-10-CM

## 2018-02-22 DIAGNOSIS — Z7189 Other specified counseling: Secondary | ICD-10-CM

## 2018-02-22 DIAGNOSIS — Z66 Do not resuscitate: Secondary | ICD-10-CM

## 2018-02-22 DIAGNOSIS — R6521 Severe sepsis with septic shock: Secondary | ICD-10-CM

## 2018-02-22 DIAGNOSIS — R6520 Severe sepsis without septic shock: Secondary | ICD-10-CM

## 2018-02-22 DIAGNOSIS — A419 Sepsis, unspecified organism: Principal | ICD-10-CM

## 2018-02-22 LAB — BASIC METABOLIC PANEL
ANION GAP: 12 (ref 5–15)
Anion gap: 6 (ref 5–15)
BUN: 51 mg/dL — ABNORMAL HIGH (ref 8–23)
BUN: 58 mg/dL — ABNORMAL HIGH (ref 8–23)
CO2: 25 mmol/L (ref 22–32)
CO2: 23 mmol/L (ref 22–32)
Calcium: 6.6 mg/dL — ABNORMAL LOW (ref 8.9–10.3)
Calcium: 7 mg/dL — ABNORMAL LOW (ref 8.9–10.3)
Chloride: 112 mmol/L — ABNORMAL HIGH (ref 98–111)
Chloride: 109 mmol/L (ref 98–111)
Creatinine, Ser: 1.81 mg/dL — ABNORMAL HIGH (ref 0.61–1.24)
Creatinine, Ser: 1.89 mg/dL — ABNORMAL HIGH (ref 0.61–1.24)
GFR calc Af Amer: 38 mL/min — ABNORMAL LOW (ref >60)
GFR calc Af Amer: 36 mL/min — ABNORMAL LOW (ref >60)
GFR calc non Af Amer: 33 mL/min — ABNORMAL LOW (ref >60)
GFR calc non Af Amer: 31 mL/min — ABNORMAL LOW (ref >60)
Glucose, Bld: 254 mg/dL — ABNORMAL HIGH (ref 70–99)
Glucose, Bld: 286 mg/dL — ABNORMAL HIGH (ref 70–99)
Potassium: 5.5 mmol/L — ABNORMAL HIGH (ref 3.5–5.1)
Potassium: 5.7 mmol/L — ABNORMAL HIGH (ref 3.5–5.1)
Sodium: 143 mmol/L (ref 135–145)
Sodium: 144 mmol/L (ref 135–145)

## 2018-02-22 LAB — DIGOXIN LEVEL: Digoxin Level: 1.7 ng/mL (ref 0.8–2.0)

## 2018-02-22 LAB — GLUCOSE, CAPILLARY
Glucose-Capillary: 329 mg/dL — ABNORMAL HIGH (ref 70–99)
Glucose-Capillary: 236 mg/dL — ABNORMAL HIGH (ref 70–99)

## 2018-02-22 LAB — HEPARIN LEVEL (UNFRACTIONATED): Heparin Unfractionated: 0.91 IU/mL — ABNORMAL HIGH (ref 0.30–0.70)

## 2018-02-22 LAB — POTASSIUM: Potassium: 5.7 mmol/L — ABNORMAL HIGH (ref 3.5–5.1)

## 2018-02-22 MED ORDER — AMIODARONE HCL IN DEXTROSE 360-4.14 MG/200ML-% IV SOLN
60.0000 mg/h | INTRAVENOUS | Status: DC
  Administered 2018-02-22: 60 mg/h via INTRAVENOUS
  Filled 2018-02-22: qty 200

## 2018-02-22 MED ORDER — SODIUM ZIRCONIUM CYCLOSILICATE 5 G PO PACK
5.0000 g | PACK | Freq: Once | ORAL | Status: AC
  Administered 2018-02-22: 5 g via ORAL
  Filled 2018-02-22: qty 1

## 2018-02-22 MED ORDER — BIOTENE DRY MOUTH MT LIQD: 15.0000 mL | OROMUCOSAL | Status: DC | PRN

## 2018-02-22 MED ORDER — FENTANYL CITRATE (PF) 100 MCG/2ML IJ SOLN
100.0000 ug | INTRAMUSCULAR | Status: DC | PRN
  Administered 2018-02-22: 200 ug via INTRAVENOUS

## 2018-02-22 MED ORDER — FENTANYL BOLUS VIA INFUSION
200.0000 ug | INTRAVENOUS | Status: DC | PRN
  Administered 2018-02-22: 200 ug via INTRAVENOUS
  Filled 2018-02-22: qty 300

## 2018-02-22 MED ORDER — INSULIN ASPART 100 UNIT/ML IV SOLN
10.0000 [IU] | Freq: Once | INTRAVENOUS | Status: AC
  Administered 2018-02-22: 10 [IU] via INTRAVENOUS
  Filled 2018-02-22: qty 0.1

## 2018-02-22 MED ORDER — DEXTROSE 50 % IV SOLN
1.0000 | Freq: Once | INTRAVENOUS | Status: AC
  Administered 2018-02-22: 50 mL via INTRAVENOUS
  Filled 2018-02-22: qty 50

## 2018-02-22 MED ORDER — LORAZEPAM 2 MG/ML IJ SOLN: 2.0000 mg | Freq: Once | INTRAMUSCULAR | Status: DC

## 2018-02-22 MED ORDER — MORPHINE SULFATE (PF) 2 MG/ML IV SOLN
2.0000 mg | INTRAVENOUS | Status: DC | PRN
  Administered 2018-02-22 (×2): 4 mg via INTRAVENOUS
  Filled 2018-02-22 (×2): qty 2
  Filled 2018-02-22: qty 1

## 2018-02-22 MED ORDER — GLYCOPYRROLATE 0.2 MG/ML IJ SOLN: 0.3000 mg | Freq: Once | INTRAMUSCULAR | Status: DC

## 2018-02-22 MED ORDER — LORAZEPAM BOLUS VIA INFUSION
2.0000 mg | INTRAVENOUS | Status: DC | PRN
  Filled 2018-02-22: qty 2

## 2018-02-22 MED ORDER — LORAZEPAM 2 MG/ML IJ SOLN
1.0000 mg | INTRAMUSCULAR | Status: DC | PRN
  Administered 2018-02-22: 2 mg via INTRAVENOUS
  Filled 2018-02-22: qty 1

## 2018-02-22 MED ORDER — POLYVINYL ALCOHOL 1.4 % OP SOLN
1.0000 [drp] | Freq: Four times a day (QID) | OPHTHALMIC | Status: DC | PRN
  Filled 2018-02-22: qty 15

## 2018-02-22 MED ORDER — AMIODARONE HCL IN DEXTROSE 360-4.14 MG/200ML-% IV SOLN
30.0000 mg/h | INTRAVENOUS | Status: DC
  Filled 2018-02-22: qty 200

## 2018-02-22 MED ORDER — SODIUM CHLORIDE 0.9 % IV SOLN
0.0000 ug/min | INTRAVENOUS | Status: DC
  Administered 2018-02-22: 10 ug/min via INTRAVENOUS
  Filled 2018-02-22: qty 4

## 2018-02-22 MED ORDER — MORPHINE BOLUS VIA INFUSION
2.0000 mg | INTRAVENOUS | Status: DC | PRN
  Filled 2018-02-22: qty 6

## 2018-02-22 MED ORDER — MORPHINE SULFATE (PF) 2 MG/ML IV SOLN
2.0000 mg | Freq: Once | INTRAVENOUS | Status: AC
  Administered 2018-02-22: 2 mg via INTRAVENOUS

## 2018-02-22 MED ORDER — GLYCOPYRROLATE 0.2 MG/ML IJ SOLN: 0.3000 mg | INTRAMUSCULAR | Status: DC | PRN

## 2018-02-22 MED ORDER — CALCIUM GLUCONATE-NACL 1-0.675 GM/50ML-% IV SOLN
1.0000 g | Freq: Once | INTRAVENOUS | Status: AC
  Administered 2018-02-22: 1000 mg via INTRAVENOUS
  Filled 2018-02-22: qty 50

## 2018-02-22 MED ORDER — GLYCOPYRROLATE 0.2 MG/ML IJ SOLN
0.4000 mg | INTRAMUSCULAR | Status: DC | PRN
  Administered 2018-02-22: 0.4 mg via INTRAVENOUS
  Filled 2018-02-22: qty 2

## 2018-02-22 MED ORDER — MORPHINE 100MG IN NS 100ML (1MG/ML) PREMIX INFUSION
4.0000 mg/h | INTRAVENOUS | Status: DC
  Administered 2018-02-22: 4 mg/h via INTRAVENOUS
  Filled 2018-02-22: qty 100

## 2018-02-22 MED ORDER — GLYCOPYRROLATE 0.2 MG/ML IJ SOLN
0.3000 mg | Freq: Once | INTRAMUSCULAR | Status: AC
  Administered 2018-02-22: 0.2 mg via INTRAVENOUS
  Filled 2018-02-22: qty 2

## 2018-02-22 MED ORDER — LORAZEPAM 2 MG/ML IJ SOLN
0.5000 mg/h | INTRAVENOUS | Status: DC
  Filled 2018-02-22: qty 25

## 2018-02-23 LAB — CULTURE, BLOOD (ROUTINE X 2)
CULTURE: NO GROWTH
Culture: NO GROWTH
Special Requests: ADEQUATE
Special Requests: ADEQUATE

## 2018-02-23 LAB — CULTURE, RESPIRATORY W GRAM STAIN

## 2018-02-23 LAB — CULTURE, RESPIRATORY

## 2018-02-24 ENCOUNTER — Telehealth: Payer: Self-pay

## 2018-02-24 NOTE — Telephone Encounter (Signed)
Death certificate received and placed in Dr. Gonzalez box for signing.  

## 2018-02-24 NOTE — Telephone Encounter (Signed)
Death certificate completed and placed at front desk. [employer] aware it is ready for pickup.

## 2018-02-24 NOTE — Telephone Encounter (Signed)
Recieved Death Certificate from _____[facility]_____ Delivered/Placed __box__________

## 2018-03-16 NOTE — Consult Note (Signed)
Consultation Note Date: Mar 13, 2018   Patient Name: Adam Roberson  DOB: 11/05/1928  MRN: [id number]  Age / Sex: 85 y.o., male  PCP: Katheren Shams Referring Physician: Loletha Grayer, MD  Reason for Consultation: Establishing goals of care and Withdrawal of life-sustaining treatment  HPI/Patient Profile: 85 y.o. male admitted on 02/21/2018 from home with complaints of shortness of breath. He has a past medical history of CAD (s/p CABG x3), diastolic CHF, DVT, CVA, hyperlipidemia, hypertension, Parkinson disease, and atrial fibrillation. Patient present to ED via EMS on BiPAP due to respiratory distress. During his ED course he was found hypoxic with saturations in 60s on room air. Patient was intubated for respiratory support. According to family patient had been complaining of chest pain and left arm pain for 2 days prior with worsening shortness of breath. Temp was 100.6. IV antibiotics were started post cultures.  138, potassium 3.3, glucose 172, AST 102, troponin 26.62.  Chest x-ray showed cardiomegaly with diffuse pulmonary vascular and interstitial congestion, superimposed multifocal opacities at the right mid and lower lung and left lung base suspicious for infiltrates, small right pleural effusion.  CT angios negative for PE.  Since admission patient continued to have uncontrolled heart rate despite use of amiodarone.  He was not synchronized with ventilator and appeared to have pulmonary edema and hypotension. Patient was seen by cardiology and determined to have non-ST segment elevation myocardial infarction.  X-ray also consistent with ARDS.  Recommendations to continue with dopamine and Lasix.  Patient continues to show no signs of improvement.  Palliative medicine team consulted for goals of care.  Clinical Assessment and Goals of Care: I have reviewed medical records including lab results,  imaging, Epic notes, and MAR, received report from the bedside RN, and assessed the patient. I then met at the bedside with patients wife, daughters Zigmund Gottron and Wallene Huh, son-in-law, and grandson to discuss diagnosis prognosis, GOC, EOL wishes, disposition and options. Patient remains sedated and intubated. Family has language barrier requiring interpretor and grandson assistance.   I introduced Palliative Medicine as specialized medical care for people living with serious illness. It focuses on providing relief from the symptoms and stress of a serious illness. The goal is to improve quality of life for both the patient and the family.  As far as functional and nutritional status reports patient has had a decline in health over the past several months.  Grandson reports patient lives with their family in the home. He was not ambulatory much prior to admission and required assistance with ADLs. Daughter report noticeable decrease in appetite over the past several weeks along with his energy level. She reports he was sleeping more and complaining of feeling tired.   We discussed his current illness and what it means in the larger context of his on-going co-morbidities.  Natural disease trajectory and expectations at EOL were discussed. I discussed specifically patient's cardiology, respiratory, and overall functional state. Family tearful during conversation but they all verbalized understanding.   I attempted to elicit values and goals  of care important to the patient.    The difference between aggressive medical intervention and comfort care was considered in light of the patient's goals of care. I discussed what comfort care would look like.  Family was educated that patient would no longer continue to receive IV fluids, continuous vitals, telemetry monitoring, lab work, radiology testing, and medications not focused on comfort.  Family also educated on terminal extubation and it's process. We discussed that  patient would continue to receive great care that is focused on how he is doing and symptoms such as shortness of breath, pain, nausea, agitation, or excessive secretions. Family verbalized understanding and appreciation.   Family verbalized patient did not have a formal advanced directive, however, he would not want to spend his life on life-support or have measures such as artificial feeding, tracheostomy, or live in a medical facility. Support given.   Family mutually expressed their wishes to terminally extubate and provide comfort care. They are aware to anticipate hospital death within minutes to hours given patient's condition. Family verbalized understanding.   Family expressed their cultural practices and requested to wash body, say prayer, place a ring on patient, wrap him in specialized sheath prior to transferring to morgue. Family requested to specifically shower patient after he passed away. I attempted to contact appropriate personnel to assist with this request however, due to limited resources and patient's infection status there was deemed no place appropriate or available to actually bath patient outside of his room. I discussed with family and offered basins and lots of towels to be able to bath him and continue with their rituals. Wife however, declined but expressed appreciations to attempt to accommodate. She expressed he must be washed fully in water during prayer and they would have to wait until his body is transferred to their designated funeral home in order to complete preparation of the body. Family expressed wishes to spend time in ritual with patient once he passes and then for staff to proceed with preparations per protocol and transport patient to morgue with anticipation of the body being transferred on Saturday. Support given and bedside RN is aware of the plans.    Questions and concerns were addressed. The family was encouraged to call with questions or concerns.  PMT  will continue to support holistically.  [id number]: At bedside with medical team to extubate and provide support to family. Patient's son remained at the bedside during process and tolerated well. Patient tolerated and extubated to 2L/[place] for comfort. Patient continued to have some signs of respiratory distress despite continuous drip. Bedside RN present and administered PRN medications for comfort. Orders placed for additional support and comfort. Family in and out of the room during dying process. Daughters tearful and siting prayer and rituals. Family leaning on each other for peace and support. Support also provided to family.   Primary Decision Maker:  NEXT OF KIN    SUMMARY OF RECOMMENDATIONS    DNR/DNI-as confirmed by family  Terminal Extubation and full comfort care as agreed by family  Family is aware to anticipate hospital death (min-hours)  All medications/orders not comfort d/c'd  Fentanyl drip for comfort later changed to morphine drip  Ativan PRN for anxiety/agitation  Robinul PRN for excessive secretions   Zofran PRN for nausea   PMT will continue to support.   Code Status/Advance Care Planning:  DNR   Symptom Management:   Fentanyl drip for comfort later changed to morphine drip  Ativan PRN for anxiety/agitation  Robinul  PRN for excessive secretions   Zofran PRN for nausea   Palliative Prophylaxis:   Aspiration, Frequent Pain Assessment and Oral Care  Additional Recommendations (Limitations, Scope, Preferences):  Full Comfort Care  Psycho-social/Spiritual:   Desire for further Chaplaincy support:{NO   Prognosis:   MIN to HOURS in the setting of sepsis, a-fib w/RVR, NSTEMI, terminal extubation, full comfort  Discharge Planning: Anticipated Hospital Death      Primary Diagnoses: Present on Admission: . Sepsis ([place]) . Atrial fibrillation with rapid ventricular response ([place])   I have reviewed the medical record, interviewed the patient  and family, and examined the patient. The following aspects are pertinent.  Past Medical History:  Diagnosis Date  . CAD (coronary artery disease)    a.2003 s/p CABG x3 in [place]; b. ? 2014 PCI (records not available).  . Chronic diastolic CHF (congestive heart failure) ([place])    a. 06/2015 Echo: EF 60-65%, mod dil LA, nl RV, mild to mod TR, PASP 65mHg.  .[place] Kitchen[place]T (deep venous thrombosis) ([place]   . [place] [place]   . H/O: CVA (cerebrovascular accident)   . HOH (hard of hearing)   . Hyperlipidemia   . Hypertension   . Hypertensive heart disease   . Parkinson disease ([place]   . Permanent atrial fibrillation    a. CHA2DS2VASc= 7-->coumadin (followed by primary care).  . Pneumonia    4/17   Social History   Socioeconomic History  . Marital status: Married    Spouse name: Not on file  . Number of children: Not on file  . Years of education: Not on file  . Highest education level: Not on file  Occupational History  . 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 Financial resource strain: Not very hard  . Food insecurity:    Worry: Never true    Inability: Never true  . Transportation needs:    Medical: No    Non-medical: No  Tobacco Use  . Smoking status: Former SResearch scientist (life sciences) . Smokeless tobacco: Never Used  Substance and Sexual Activity  . Alcohol use: No  . Drug use: No  . Sexual activity: Not on file  Lifestyle  . Physical activity:    Days per week: 0 days    Minutes per session: 0 min  . Stress: Not at all  Relationships  . Social connections:    Talks on phone: More than three times a week    Gets together: More than three times a week    Attends religious service: Patient refused    Active member of club or organization: Patient refused    Attends meetings of clubs or organizations: Patient refused    Relationship status: Married  Other Topics Concern  . Not on file  Social History Narrative   Lives at home with family. Not on food stamps.    Family History  Problem Relation Age of Onset  . CAD Mother    Scheduled Meds: . glycopyrrolate  0.3 mg Intravenous Once   Continuous Infusions: . fentaNYL infusion INTRAVENOUS 100 mcg/hr (101/02/20200630)   PRN Meds:.antiseptic oral rinse, fentaNYL (SUBLIMAZE) injection, glycopyrrolate, LORazepam, [DISCONTINUED] ondansetron **OR** ondansetron (ZOFRAN) IV, polyvinyl alcohol, sodium chloride flush Medications Prior to Admission:  Prior to Admission medications   Medication Sig Start Date End Date Taking? Authorizing Provider  atorvastatin (LIPITOR) 20 MG tablet Take 1 tablet (20 mg total) by mouth daily. 02/03/17  Yes CTawni Millers MD  carbidopa-levodopa (SINEMET IR) 25-250 MG  tablet TAKE ONE TABLET BY MOUTH 3 TIMES A DAY Patient taking differently: Take 1 tablet by mouth 3 (three) times daily.  12/22/17  Yes Tawni Millers, MD  diltiazem (CARDIZEM CD) 120 MG 24 hr capsule TAKE ONE CAPSULE BY MOUTH EVERY DAY Patient taking differently: Take 120 mg by mouth daily.  10/28/17  Yes Tawni Millers, MD  furosemide (LASIX) 40 MG tablet Take 1 tablet (40 mg total) by mouth daily. 08/11/17 08/11/18 Yes Tawni Millers, MD  metoprolol tartrate (LOPRESSOR) 50 MG tablet TAKE ONE TABLET BY MOUTH 2 TIMES A DAY Patient taking differently: Take 50 mg by mouth 2 (two) times daily.  12/22/17  Yes Doles-Johnson, Teah, NP  warfarin (COUMADIN) 3 MG tablet Take 3 mg by mouth daily.   Yes [provider]  ALPRAZolam Duanne Moron) 0.5 MG tablet Take 1 tablet (0.5 mg total) by mouth at bedtime as needed for anxiety. Takes 3 times weekly. Patient not taking: Reported on 02/14/2018 08/20/17   Fritzi Mandes, MD  ibuprofen (ADVIL,MOTRIN) 600 MG tablet Take 1 tablet (600 mg total) by mouth 3 (three) times daily. for 3-4 days with food Patient not taking: Reported on 02/14/2018 08/20/17   Fritzi Mandes, MD  risperiDONE (RISPERDAL) 1 MG tablet Take 1 tablet (1 mg total) by mouth 2 (two) times daily. Patient not taking: Reported  on 02/14/2018 08/20/17   Fritzi Mandes, MD  traZODone (DESYREL) 50 MG tablet Take 1 tablet (50 mg total) by mouth at bedtime. Patient not taking: Reported on 02/14/2018 08/20/17   Fritzi Mandes, MD  warfarin (COUMADIN) 5 MG tablet Take 2.5 mg on sat and Sunday and then resume the routine  With Taking 1/2 Tablet on Monday and Friday. Take 1 Tablet on Tuesday, Wednesday, Thursday, Saturday and Sunday or as directed. Patient not taking: Reported on 02/14/2018 08/20/17   Fritzi Mandes, MD   No Known Allergies Review of Systems  Unable to perform ROS: Intubated    Physical Exam  Constitutional: He has a sickly appearance. He is intubated.  Thin, frail appearing   Cardiovascular: An irregular rhythm present. Exam reveals decreased pulses.  Murmur heard. Pulmonary/Chest: He is intubated. He has decreased breath sounds. He has rhonchi.  Abdominal: Soft. Normal appearance. Bowel sounds are decreased.  Neurological: He is unresponsive. He displays atrophy.  Skin: Skin is warm and dry. Bruising noted.  Psychiatric: Cognition and memory are impaired. He expresses inappropriate judgment.  Nursing note and vitals reviewed.   Vital Signs: BP 102/64   Pulse (!) 115   Temp (!) 97.3 F (36.3 C)   Resp (!) 28   Ht _0  (1.676 m)   Wt 68.1 kg   SpO2 91%   BMI 24.23 kg/m  Pain Scale: CPOT POSS *See Group Information*: 2-Acceptable,Slightly drowsy, easily aroused     SpO2: SpO2: 91 % O2 Device:SpO2: 91 % O2 Flow Rate: .   IO: Intake/output summary:   Intake/Output Summary (Last 24 hours) at 03-23-18 1149 Last data filed at 03-23-2018 0600 Gross per 24 hour  Intake 1764.67 ml  Output 675 ml  Net 1089.67 ml    LBM: Last BM Date: (no BM since admission) Baseline Weight: Weight: 66.2 kg Most recent weight: Weight: 68.1 kg     Palliative Assessment/Data: INTUBATED   Flowsheet Rows     Most Recent Value  Intake Tab  Referral Department  Critical care  Unit at Time of Referral  ICU  Palliative  Care Primary Diagnosis  Cardiac  Date Notified  02/20/18  Palliative Care Type  New Palliative care  Reason for referral  Clarify Goals of Care  Date of Admission  02/16/2018  # of days IP prior to Palliative referral  7  Clinical Assessment  Psychosocial & Spiritual Assessment  Palliative Care Outcomes     Time In: 1115 Time Out: 1230 Time In: 1300  Time Out: 1415 Time Total: 150 min  Greater than 50%  of this time was spent counseling and coordinating care related to the above assessment and plan.  Signed by:  Alda Lea, AGPCNP-BC Palliative Medicine Team  Phone: [phone] Fax: [phone] Pager: [phone] Amion: Bjorn Pippin    Please contact Palliative Medicine Team phone at [phone] for questions and concerns.  For individual provider: See Shea Evans

## 2018-03-16 NOTE — Progress Notes (Signed)
Per Palliatives care patient being transitioned to comfort care. Called [facility]. [phone]. Ref # [id number]. Call with time of death.

## 2018-03-16 NOTE — Care Management Note (Signed)
Case Management Note  Patient Details  Name: Adam Roberson MRN: [id number] Date of Birth: 1928/12/04  Subjective/Objective:       Palliative care met with family today at 45 am and the family decided to transition patient to comfort care.  RNCM communicated with palliative that the family had asked about washing the body after death and had asked about how long the body could stay in the morgue.  House Supervisor informed RNCM that the body can stay in the morgue for 10 days before the hospital will contact social services for assistance with cremation.  Per Palliative the family just needs until Saturday.  The family is also welcome to use the patient room after death for washing and wrapping the body, there is no other room available for this.  The hospital does not assist with funeral costs, if the family cannot afford a funeral the hospital can assist with cremation only.   RNCM is available to the family if they request further information or have questions. Adam Clay RN BSN  [phone]                Action/Plan:   Expected Discharge Date:                  Expected Discharge Plan:     In-House Referral:     Discharge planning Services     Post Acute Care Choice:    Choice offered to:     DME Arranged:    DME Agency:     HH Arranged:    HH Agency:     Status of Service:     If discussed at [place] of Stay Meetings, dates discussed:    Additional Comments:  Adam Hutching, RN 02-23-2018, 1:50 PM

## 2018-03-16 NOTE — Death Summary Note (Signed)
DEATH SUMMARY   Patient Details  Name: Adam Roberson MRN: [id number] DOB: 1928/09/01  Admission/Discharge Information   Admit Date:  2018/02/16  Date of Death:  02/25/18  Time of Death:  1424  Length of Stay: 9  Referring Physician: Raynelle Bring   Reason(s) for Hospitalization  Acute Hypoxic Respiratory Failure secondary to Pneumonia and Acute Decompensated Heart Failure Sepsis  Diagnoses  Preliminary cause of death:   Acute Hypoxic Respiratory Failure secondary to Pneumonia, Acute Decompensated Heart Failure Secondary Diagnoses (including complications and co-morbidities):  Principal Problem:   Acute respiratory failure with hypoxia (HCC) Active Problems:   Non-ST elevation (NSTEMI) myocardial infarction (HCC)   Sepsis (HCC)   Atrial fibrillation with rapid ventricular response (HCC)   Ischemic cardiomyopathy AKI  Brief Hospital Course (including significant findings, care, treatment, and services provided and events leading to death)  Adam Roberson is a 85 y.o. year old male who presented to the emergency department via EMS secondary to aggressive respiratory failure with hypoxemia. Initially tried a trial of BiPAP, but Patient subsequently required intubation and admitted to the intensive care unit. Was treated for respiratory failure and pneumonia. Also found to have NSTEMI and AKI. Pt vent dependent respiratory failure, failed multiple spontaneous breathing trials.  Pt's family chose to withdraw care and make pt comfort care on 02/25/2018.  Shortly after withdrawal of care, pt expired.    Pertinent Labs and Studies  Significant Diagnostic Studies Dg Abd 1 View  Result Date: 02/19/2018 CLINICAL DATA:  Aspirated gastric contents. EXAM: ABDOMEN - 1 VIEW COMPARISON:  Chest x-ray dated 02-16-2018. FINDINGS: NG tube tip in the distal stomach. Nonobstructive bowel gas pattern. No acute osseous abnormality. IMPRESSION: 1. NG tube in the stomach.  No  acute findings. Electronically Signed   By: Obie Dredge M.D.   On: 02/19/2018 09:24   Ct Angio Chest Pe W Or Wo Contrast  Result Date: 16-Feb-2018 CLINICAL DATA:  Decreased oxygen saturation requiring intubation. EXAM: CT ANGIOGRAPHY CHEST WITH CONTRAST TECHNIQUE: Multidetector CT imaging of the chest was performed using the standard protocol during bolus administration of intravenous contrast. Multiplanar CT image reconstructions and MIPs were obtained to evaluate the vascular anatomy. CONTRAST:  75mL OMNIPAQUE IOHEXOL 350 MG/ML SOLN COMPARISON:  Chest radiograph 16-Feb-2018 FINDINGS: Cardiovascular: Satisfactory opacification of the pulmonary arteries to the segmental level. No evidence of pulmonary embolism. Enlarged heart. No pericardial effusion. Calcific atherosclerotic disease of the coronary arteries and aorta. Annular calcifications of the mitral valve. Mediastinum/Nodes: Borderline enlarged bilateral mediastinal lymph nodes. The patient is intubated. Main bronchi are patent. Enteric catheter within the esophagus, terminates in the distal gastric body. Lungs/Pleura: Moderate right and small left pleural effusions. Mild interstitial pulmonary edema. Scattered ground-glass opacities throughout both lungs, with more confluent appearance in the lower lobes, left greater than right. Upper Abdomen: Right renal cyst. 18 mm hypoattenuated nodule within the spleen, likely benign. Musculoskeletal: Findings of diffuse idiopathic skeletal hyperostosis of the thoracic spine Review of the MIP images confirms the above findings. IMPRESSION: Enlarged heart with interstitial pulmonary edema and bilateral pleural effusions. Superimposed patchy airspace consolidation in both lungs, more confluent in the lower lobes, suspicious for multifocal pneumonia. Borderline mediastinal lymphadenopathy, likely reactive. Aortic Atherosclerosis (ICD10-I70.0). Electronically Signed   By: Ted Mcalpine M.D.   On: 02-16-2018 12:50    Dg Chest Port 1 View  Result Date: 2018/02/25 CLINICAL DATA:  Acute respiratory failure. EXAM: PORTABLE CHEST 1 VIEW COMPARISON:  Radiograph February 20, 2018. FINDINGS: Stable cardiomegaly. Endotracheal  and nasogastric tubes are unchanged in position. Atherosclerosis of thoracic aorta is noted. Stable bilateral lung opacities are noted concerning for edema with associated pleural effusions. Right-sided PICC line is unchanged in position. Bony thorax is unremarkable. IMPRESSION: Stable support apparatus. Stable bilateral pulmonary edema and pleural effusions. Electronically Signed   By: Lupita Raider, M.D.   On: 02/21/2018 07:22   Dg Chest Port 1 View  Result Date: 02/20/2018 CLINICAL DATA:  Acute respiratory failure EXAM: PORTABLE CHEST 1 VIEW COMPARISON:  One day prior FINDINGS: Endotracheal tube terminates 3.6 cm above carina.Nasogastric tube extends beyond the inferior aspect of the film. Right-sided PICC line tip at superior caval/atrial junction. Prior median sternotomy. Normal heart size. Small right pleural effusion remains. Interstitial prominence and indistinctness, similar. Bibasilar airspace disease is not significantly changed. IMPRESSION: No significant change since one day prior. Congestive heart failure, with small right pleural effusion and bibasilar airspace disease. Electronically Signed   By: Jeronimo Greaves M.D.   On: 02/20/2018 10:35   Dg Chest Port 1 View  Result Date: 02/19/2018 CLINICAL DATA:  Acute respiratory failure. EXAM: PORTABLE CHEST 1 VIEW COMPARISON:  Chest x-ray dated February 18, 2018. FINDINGS: Unchanged endotracheal and enteric tubes. Unchanged right upper extremity PICC line. Stable cardiomediastinal silhouette and diffusely increased interstitial markings. Unchanged small bilateral pleural effusions and bibasilar atelectasis. No pneumothorax. No acute osseous abnormality. IMPRESSION: Stable pulmonary edema and small bilateral pleural effusions. Electronically  Signed   By: Obie Dredge M.D.   On: 02/19/2018 09:21   Dg Chest Port 1 View  Result Date: 02/19/2018 CLINICAL DATA:  Aspiration. EXAM: PORTABLE CHEST 1 VIEW COMPARISON:  02/19/2018 at 0545 hours FINDINGS: 0757 hours. Endotracheal tube terminates 2.6 cm above carina.Nasogastric tube extends beyond the inferior aspect of the film. Right-sided PICC line tip at low SVC. Normal heart size for level of inspiration. Atherosclerosis in the transverse aorta. Probable small bilateral pleural effusions. No pneumothorax. Moderate interstitial thickening is similar. Left greater than right base airspace disease. IMPRESSION: No significant change since earlier in the day. Bibasilar airspace disease, which could represent infection or aspiration. Concurrent interstitial thickening and small bilateral pleural effusions, suggesting congestive heart failure. Aortic Atherosclerosis (ICD10-I70.0). Electronically Signed   By: Jeronimo Greaves M.D.   On: 02/19/2018 08:37   Dg Chest Port 1 View  Result Date: 02/18/2018 CLINICAL DATA:  Respiratory failure EXAM: PORTABLE CHEST 1 VIEW COMPARISON:  02/17/2018 and prior studies FINDINGS: An endotracheal tube with tip 2 cm above the carina, NG tube entering the stomach with tip off the field of view and RIGHT PICC line with tip overlying the SUPERIOR cavoatrial junction again noted. Cardiomediastinal silhouette is unchanged. CABG changes again identified. Bilateral interstitial opacities/edema again identified with bilateral pleural effusions. No pneumothorax. IMPRESSION: Unchanged appearance of the chest with pulmonary edema and pleural effusions. Electronically Signed   By: Harmon Pier M.D.   On: 02/18/2018 10:50   Dg Chest Port 1 View  Result Date: 02/17/2018 CLINICAL DATA:  Mechanically assisted ventilation. EXAM: PORTABLE CHEST 1 VIEW COMPARISON:  02/16/2018. FINDINGS: Endotracheal tube again terminates approximately 5 mm above the carina. Proximal repositioning of  approximately 2-3 cm again suggested. Interim placement of right PICC line, its tip is over the right atrium. NG tube noted with tip below left hemidiaphragm. Prior CABG. Cardiomegaly with bilateral pulmonary infiltrates/edema again noted. Small bilateral pleural effusions again noted. Findings suggest congestive heart failure. No pneumothorax. IMPRESSION: 1. Endotracheal tube tip again noted 5 mm above the carina, proximal repositioning  of approximately 2 3 cm again suggested. 2. Interim placement of PICC line. Its tip is over the right atrium. 3. Prior CABG. Findings consistent with congestive heart failure bilateral pulmonary edema and bilateral pleural effusions again noted. These results will be called to the ordering clinician or representative by the Radiologist Assistant, and communication documented in the PACS or zVision Dashboard. Electronically Signed   By: Maisie Fushomas  Register   On: 02/17/2018 13:00   Dg Chest Port 1 View  Result Date: 02/16/2018 CLINICAL DATA:  Respiratory distress.  Patient on ventilation. EXAM: PORTABLE CHEST 1 VIEW COMPARISON:  February 15, 2018 FINDINGS: The ETT terminates slightly above the carina, 5 mm. An NG tube terminates below today's film. A right PICC line terminates in the SVC. Diffuse interstitial opacities. Bilateral effusions. More focal infiltrate in the left base. Stable cardiomediastinal silhouette. IMPRESSION: 1. The ETT terminates slightly above the carina, 5 mm. Recommend withdrawing 2 cm. 2. Other support apparatus as above. 3. Diffuse interstitial opacities and bilateral effusions. 4. More focal infiltrate in the left base. These results will be called to the ordering clinician or representative by the Radiologist Assistant, and communication documented in the PACS or zVision Dashboard. Electronically Signed   By: Gerome Samavid  Williams III M.D   On: 02/16/2018 14:22   Dg Chest Port 1 View  Result Date: 02/15/2018 CLINICAL DATA:  PICC Line placement EXAM: PORTABLE  CHEST 1 VIEW COMPARISON:  02/15/2018 FINDINGS: Interval placement of RIGHT-sided PICC line, tip overlying the superior vena cava. Endotracheal tube is in place with tip approximately 1 centimeter above the carina. A nasogastric tube is in place, tip off the image beyond the gastroesophageal junction. The heart is enlarged and stable in configuration. There are patchy infiltrates in the lungs bilaterally. Bilateral pleural effusions are stable. IMPRESSION: Interval placement of RIGHT-sided PICC line, tip to the superior vena cava. Stable bilateral pleural effusions and parenchymal opacities. Electronically Signed   By: Norva PavlovElizabeth  Brown M.D.   On: 02/15/2018 09:40   Dg Chest Port 1 View  Result Date: 02/15/2018 CLINICAL DATA:  Acute respiratory failure EXAM: PORTABLE CHEST 1 VIEW COMPARISON:  03/03/2018 FINDINGS: Cardiac shadow is stable. Endotracheal tube is noted at the level of the carina. This could be withdrawn 2-3 cm. Nasogastric catheter extends into the stomach. Vascular congestion and increasing pleural effusions are seen. Bibasilar atelectasis/infiltrate is noted as well. IMPRESSION: Endotracheal tube at the level of the carina. This could be withdrawn 2-3 cm. Persistent changes of CHF with increasing effusions bilaterally. Bibasilar opacities consistent with atelectasis or infiltrate. Electronically Signed   By: Alcide CleverMark  Lukens M.D.   On: 02/15/2018 07:26   Dg Chest Port 1 View  Result Date: 02/21/2018 CLINICAL DATA:  Initial evaluation for sepsis, post intubation. EXAM: PORTABLE CHEST 1 VIEW COMPARISON:  Prior radiograph from 08/17/2017 FINDINGS: Endotracheal tube in place with tip position approximately 15 mm above the carina. Enteric tube courses into the abdomen with side hole beyond the GE junction. Median sternotomy wires underlying surgical clips noted, stable. Cardiomegaly unchanged. Mediastinal silhouette normal. Aortic atherosclerosis. Lungs are mildly hypoinflated. Diffuse pulmonary vascular  and interstitial congestion, suggesting pulmonary interstitial edema. There are superimposed confluent multifocal opacities at the right mid and lower lung as well as the left lung base, suspicious for infiltrates. Associated small right pleural effusion. No pneumothorax. No acute osseus abnormality. IMPRESSION: 1. Tip of the endotracheal tube approximately 15 mm above the carina. 2. Cardiomegaly with diffuse pulmonary vascular and interstitial congestion, suggesting pulmonary interstitial edema. 3.  Superimposed multifocal opacities at the right mid and lower lung and left lung base, suspicious for infiltrates. 4. Small right pleural effusion. Electronically Signed   By: Rise Mu M.D.   On: Mar 13, 2018 05:57   [place] Ekg Site Rite  Result Date: 02/14/2018 If Site Rite image not attached, placement could not be confirmed due to current cardiac rhythm.   Microbiology Recent Results (from the past 240 hour(s))  Blood Culture (routine x 2)     Status: None   Collection Time: 03-13-18  5:27 AM  Result Value Ref Range Status   Specimen Description BLOOD RIGHT HAND  Final   Special Requests   Final    BOTTLES DRAWN AEROBIC AND ANAEROBIC Blood Culture adequate volume   Culture   Final    NO GROWTH 5 DAYS Performed at [facility], [address], [place], [place] [address]    Report Status 02/18/2018 FINAL  Final  Blood Culture (routine x 2)     Status: None   Collection Time: 2018/03/13  5:27 AM  Result Value Ref Range Status   Specimen Description BLOOD LEFT FOREARM  Final   Special Requests   Final    BOTTLES DRAWN AEROBIC AND ANAEROBIC Blood Culture adequate volume   Culture   Final    NO GROWTH 5 DAYS Performed at [facility], [address] [place], [place] [address]    Report Status 02/18/2018 FINAL  Final  Urine culture     Status: None   Collection Time: 03/13/2018  5:27 AM  Result Value Ref Range Status   Specimen Description   Final    URINE,  RANDOM Performed at [facility], [address] [place], [place] [address]    Special Requests   Final    NONE Performed at [facility], [address], [place], [place] [address]    Culture   Final    NO GROWTH Performed at [facility], [address] [place], [place] [address]    Report Status 02/14/2018 FINAL  Final  MRSA PCR Screening     Status: None   Collection Time: 13-Mar-2018  8:32 AM  Result Value Ref Range Status   MRSA by PCR NEGATIVE NEGATIVE Final    Comment:        The GeneXpert MRSA Assay (FDA approved for NASAL specimens only), is one component of a comprehensive MRSA colonization surveillance program. It is not intended to diagnose MRSA infection nor to guide or monitor treatment for MRSA infections. Performed at [facility], [address], [place], [place] [address]   Urine Culture     Status: None   Collection Time: 02/18/18  5:56 PM  Result Value Ref Range Status   Specimen Description   Final    URINE, RANDOM Performed at [facility], [address] [place], [place] [address]    Special Requests   Final    NONE Performed at [facility], [address], [place], [place] [address]    Culture   Final    NO GROWTH Performed at [facility], [address], [place], [place] [address]    Report Status 02/20/2018 FINAL  Final  CULTURE, BLOOD (ROUTINE X 2) w Reflex to ID Panel     Status: None (Preliminary result)   Collection Time: 02/18/18  9:19 PM  Result Value Ref Range Status   Specimen Description BLOOD RIGHT HAND  Final   Special Requests  Final    BOTTLES DRAWN AEROBIC AND ANAEROBIC Blood Culture adequate volume   Culture   Final    NO GROWTH 4 DAYS Performed at [facility], [address] [place], [place] [address]    Report Status PENDING  Incomplete  Culture, respiratory (non-expectorated)     Status: None   Collection Time: 02/18/18  9:21 PM  Result  Value Ref Range Status   Specimen Description   Final    TRACHEAL ASPIRATE Performed at [facility], [address], [place], [place] [address]    Special Requests   Final    NONE Performed at [facility], [address] [place], [place] [address]    Gram Stain   Final    ABUNDANT WBC PRESENT,BOTH PMN AND MONONUCLEAR RARE GRAM POSITIVE COCCI RARE GRAM VARIABLE ROD Performed at [facility], [address], [place], [place] [address]    Culture FEW METHICILLIN RESISTANT STAPHYLOCOCCUS AUREUS  Final   Report Status 02/21/2018 FINAL  Final   Organism ID, Bacteria METHICILLIN RESISTANT STAPHYLOCOCCUS AUREUS  Final      Susceptibility   Methicillin resistant staphylococcus aureus - MIC*    CIPROFLOXACIN <=0.5 SENSITIVE Sensitive     ERYTHROMYCIN <=0.25 SENSITIVE Sensitive     GENTAMICIN <=0.5 SENSITIVE Sensitive     OXACILLIN RESISTANT Resistant     TETRACYCLINE <=1 SENSITIVE Sensitive     VANCOMYCIN <=0.5 SENSITIVE Sensitive     TRIMETH/SULFA <=10 SENSITIVE Sensitive     CLINDAMYCIN <=0.25 SENSITIVE Sensitive     RIFAMPIN <=0.5 SENSITIVE Sensitive     Inducible Clindamycin NEGATIVE Sensitive     * FEW METHICILLIN RESISTANT STAPHYLOCOCCUS AUREUS  CULTURE, BLOOD (ROUTINE X 2) w Reflex to ID Panel     Status: None (Preliminary result)   Collection Time: 02/18/18  9:31 PM  Result Value Ref Range Status   Specimen Description BLOOD RIGHT WRIST  Final   Special Requests   Final    BOTTLES DRAWN AEROBIC AND ANAEROBIC Blood Culture adequate volume   Culture   Final    NO GROWTH 4 DAYS Performed at [facility], [address], [place], [place] [address]    Report Status PENDING  Incomplete  Culture, respiratory (non-expectorated)     Status: None (Preliminary result)   Collection Time: 02/21/18  5:16 AM  Result Value Ref Range Status   Specimen Description   Final    TRACHEAL ASPIRATE Performed at [facility], [address]  [place], [place] [address]    Special Requests   Final    NONE Performed at [facility], [address] [place], [place] [address]    Gram Stain   Final    FEW WBC PRESENT, PREDOMINANTLY PMN MODERATE GRAM POSITIVE COCCI    Culture   Final    ABUNDANT STAPHYLOCOCCUS AUREUS SUSCEPTIBILITIES TO FOLLOW Performed at [facility], [address] [place], [place] [address]    Report Status PENDING  Incomplete    Lab Basic Metabolic Panel: Recent Labs  Lab 02/18/18 1341  02/19/18 0516 02/20/18 0521 02/21/18 0509 02/21/18 2230 03-05-2018 0157 05-Mar-2018 0600  NA 148*  --  151* 148* 145  --  143 144  K 2.7*   < > 3.8 3.5 4.0 6.6* 5.5* 5.7*  5.7*  CL 107  --  111 107 108  --  112* 109  CO2 35*  --  33* 33* 30  --  25 23  GLUCOSE  188*  --  196* 198* 208*  --  254* 286*  BUN 44*  --  40* 40* 39*  --  51* 58*  CREATININE 0.86  --  0.83 0.84 0.96  --  1.81* 1.89*  CALCIUM 7.7*  --  7.7* 7.4* 7.2*  --  6.6* 7.0*  MG 2.0  --  2.2  --  2.2  --   --   --    < > = values in this interval not displayed.   Liver Function Tests: No results for input(s): AST, ALT, ALKPHOS, BILITOT, PROT, ALBUMIN in the last 168 hours. No results for input(s): LIPASE, AMYLASE in the last 168 hours. No results for input(s): AMMONIA in the last 168 hours. CBC: Recent Labs  Lab 02/17/18 0645 02/18/18 0329 02/19/18 0516 02/20/18 0521 02/21/18 0509  WBC 9.2 8.9 10.5 8.7 8.3  HGB 11.9* 11.7* 12.2* 11.1* 11.3*  HCT 37.1* 36.1* 39.2 35.6* 36.8*  MCV 91.6 91.9 94.7 95.4 95.3  PLT 152 173 160 167 156   Cardiac Enzymes: Recent Labs  Lab 02/16/18 0410  TROPONINI 17.50*   Sepsis Labs: Recent Labs  Lab 02/18/18 0329 02/18/18 2119 02/19/18 0516 02/20/18 0521 02/21/18 0509  PROCALCITON  --  0.27  --   --   --   WBC 8.9  --  10.5 8.7 8.3    Procedures/Operations  PICC line inserted 02/15/2018       Harlon Ditty, AGACNP-BC [facility] Pager:  [phone] Cell: [phone]  Judithe Modest 03/05/2018, 2:43 PM

## 2018-03-16 NOTE — Progress Notes (Signed)
Patient pronounced at 14:24. Notified Wilkie Ayesupervisor,Jeremiah Keene, family, [place] [facility]. Patient's EKG asystole, no sound during auscultation, pulse not palpable, verified by Billey Goslingharlie RN. Spoke with Luna KitchensLatasha patient is not a candidate for donation. At this time family is unsure of which funeral home they would like him to go to. Elton Sinlham Symes will decide what funeral home. Her number is [phone].

## 2018-03-16 NOTE — Progress Notes (Signed)
ANTICOAGULATION CONSULT NOTE - Initial Consult  Pharmacy Consult for heparin drip Indication: chest pain/ACS  No Known Allergies  Patient Measurements: Height: 5\' 6"  (167.6 cm) Weight: 150 lb 2.1 oz (68.1 kg) IBW/kg (Calculated) : 63.8 Heparin Dosing Weight: 66 kg  Vital Signs: Temp: 97.6 F (36.4 C) (12/10 0400) Temp Source: Axillary (12/10 0400) BP: 80/54 (12/10 0600) Pulse Rate: 117 (12/10 0600)  Labs: Recent Labs    02/20/18 0521 02/20/18 0531 02/21/18 0509 Mar 15, 2018 0157 Mar 15, 2018 0600  HGB 11.1*  --  11.3*  --   --   HCT 35.6*  --  36.8*  --   --   PLT 167  --  156  --   --   HEPARINUNFRC  --  0.51 0.54  --  0.91*  CREATININE 0.84  --  0.96 1.81*  --     Estimated Creatinine Clearance: 25.9 mL/min (A) (by C-G formula based on SCr of 1.81 mg/dL (H)).   Medical History: Past Medical History:  Diagnosis Date  . CAD (coronary artery disease)    a.2003 s/[place] CABG x3 in [place]; b. ? 2014 PCI (records not available).  . Chronic diastolic CHF (congestive heart failure) (HCC)    a. 06/2015 Echo: EF 60-65%, mod dil LA, nl RV, mild to mod TR, PASP 46mmHg.  [place]. DVT (deep venous thrombosis) (HCC)   . DVT of leg (deep venous thrombosis) (HCC)   . H/O: CVA (cerebrovascular accident)   . HOH (hard of hearing)   . Hyperlipidemia   . Hypertension   . Hypertensive heart disease   . Parkinson disease (HCC)   . Permanent atrial fibrillation    a. CHA2DS2VASc= 7-->coumadin (followed by primary care).  . Pneumonia    4/17    Medications:  Warfarin as outpatient  Assessment: Trop  26.62   12/6 13:00 Heparin level resulted at 0.40, heparin infusing at 1000 units/hr.  Goal of Therapy:  Heparin level 0.3-0.7 units/ml Monitor platelets by anticoagulation protocol: Yes   Plan:  12/10 @ 0600 HL 0.91 supratherapeutic. Will decrease rate to 850 units/hr and will recheck HL @ 1400.  Thomasene Rippleavid Rocklin Soderquist, PharmD, BCPS Clinical Pharmacist 08-16-17

## 2018-03-16 NOTE — Progress Notes (Signed)
Patient ID: Adam Roberson, male   DOB: 05-16-1928, 85 y.o.   MRN: [id number]   Patient had a difficult night.  Very erratic heart rate control required amiodarone which has only been partially effective.  He appeared to be deteriorating yesterday not being synchronous with the ventilator and quired increased doses of sedatives overnight to try to synchronize the patient with the ventilator.  He appears to have pulmonary edema, however blood pressure is low and cannot be diuresed.  He is agonal on the ventilator this morning.  Family has been contemplating transitioning to comfort care and this was done today.  Palliative care medicine on board and assisting in this endeavor.  Appreciate palliative care involvement.   Gailen Shelter. Laura Gonzalez, MD [facility]

## 2018-03-16 NOTE — Progress Notes (Signed)
Inpatient Diabetes Program Recommendations  AACE/ADA: New Consensus Statement on Inpatient Glycemic Control (2015)  Target Ranges:  Prepandial:   less than 140 mg/dL      Peak postprandial:   less than 180 mg/dL (1-2 hours)      Critically ill patients:  140 - 180 mg/dL   Results for [name], Adam Roberson (MRN [id number]) as of 2017/09/23 08:08  Ref. Range 02/21/2018 00:13 02/21/2018 03:41 02/21/2018 07:31 02/21/2018 08:22 02/21/2018 12:32 02/21/2018 15:57 02/21/2018 19:46  Glucose-Capillary Latest Ref Range: 70 - 99 mg/dL 409184 (H)  2 units NOVOLOG  165 (H)  2 units NOVOLOG 194 (H)  2 units NOVOLOG 195 (H) 198 (H)  2 units NOVOLOG 181 (H)  2 units NOVOLOG 188 (H)  2 units NOVOLOG   Results for [name], Adam Roberson (MRN [id number]) as of 2017/09/23 08:08  Ref. Range 02/21/2018 23:21 2017/09/23 03:47 2017/09/23 07:36  Glucose-Capillary Latest Ref Range: 70 - 99 mg/dL 956230 (H)  13 units NOVOLOG  329 (H)  17 units NOVOLOG  236 (H)  10 units NOVOLOG     Admit with: Acute hypoxic respiratory failure/ NSTEMI  History: CHF, Parkinsons  Current Orders: Novolog Sensitive Correction Scale/ SSI (0-9 units) Q4 hours     Patient remains Intubated.  Tube feeds running 40cc/hr.  Palliative Care Team meeting today per Care Management notes.     MD- If within goals of care for this patient, please consider the following in-hospital insulin adjustments:  Start Novolog Tube Feed Coverage: Novolog 3 units Q4 hours  HOLD if tube feeds HELD for any reason      --Will follow patient during hospitalization--  Ambrose FinlandJeannine Johnston Emerita Berkemeier RN, MSN, CDE Diabetes Coordinator Inpatient Glycemic Control Team Team Pager: [phone] (8a-5p)

## 2018-03-16 NOTE — Progress Notes (Signed)
   03-07-18 1305  Clinical Encounter Type  Visited With Patient and family together  Visit Type Follow-up;Other (Comment) (patient moved to comfort care)  Referral From [place] responded to page regarding patient's move to comfort care.  Chaplain met family in hallway as well as on unit.  Family asked for prayer which chaplain led.  Chaplain asked if family would like for chaplain to follow up later; family expressed that they will have staff page for chaplain if needed.

## 2018-03-16 DEATH — deceased

## 2018-06-27 IMAGING — CT CT FEMUR *L* W/O CM
2 of 4 series · 10 of 29 positions shown, 11 images · non-contrast
Comparison: Left hip radiographs 08/19/2017.

CLINICAL DATA: Left lateral hip pain worsening with movement.
History of dementia, Parkinson's disease and atrial fibrillation. No
known recent injury.

EXAM:
CT OF THE LOWER LEFT EXTREMITY WITHOUT CONTRAST
TECHNIQUE: Multidetector CT imaging of the left thigh was performed according
to the standard protocol.

[Series 4: axial st · axial · 0.47mm/px · z∈[+732,+1048]mm · 4 of 238 slices shown, 5 images]
[im 40/238  soft-tissue]
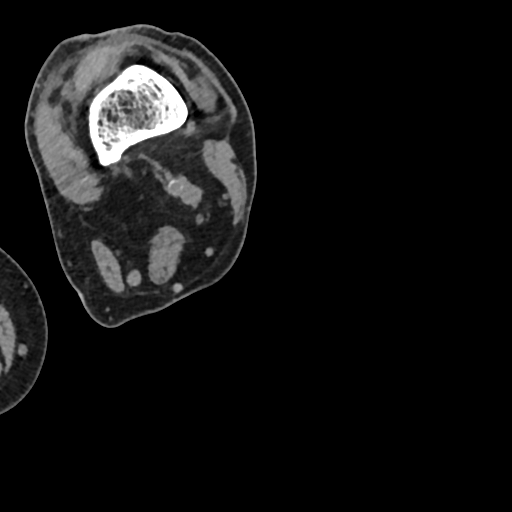
[im 40/238  bone]
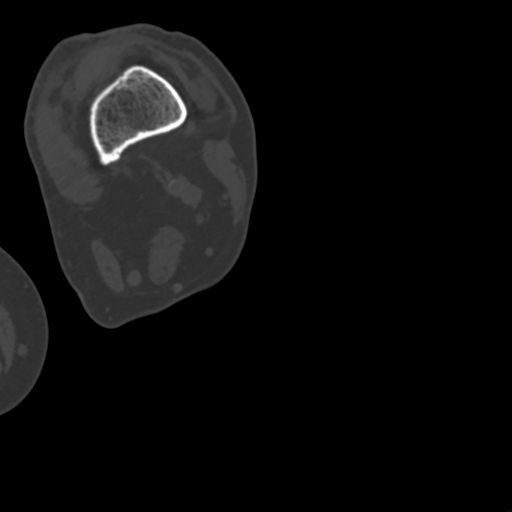
[im 80/238  bone]
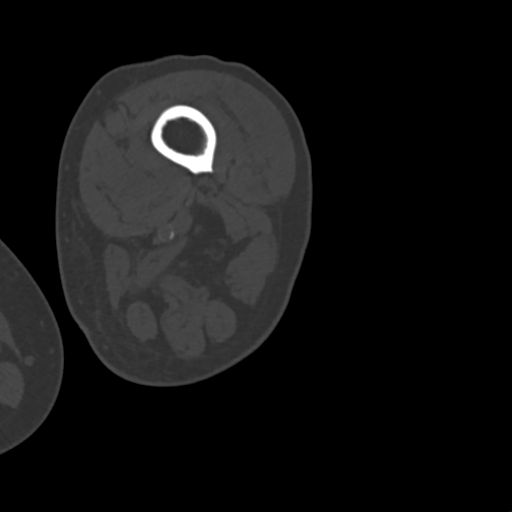
[im 159/238  bone]
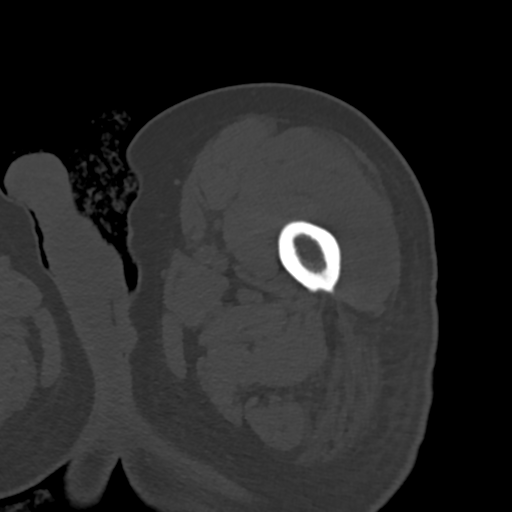
[im 198/238  bone]
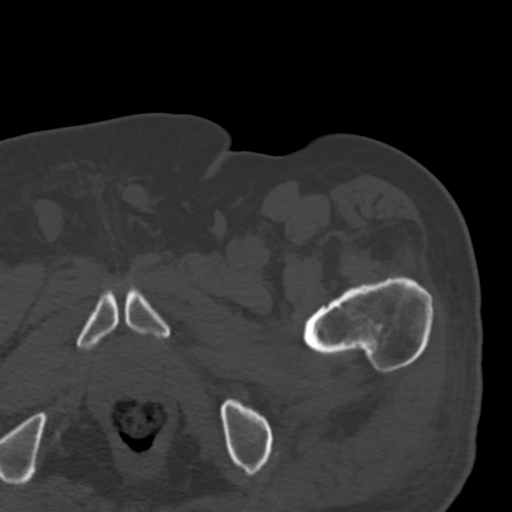

[Series 8: sagittal st · sagittal · 0.42mm/px · 6 of 87 slices shown]
[im 15/87  bone]
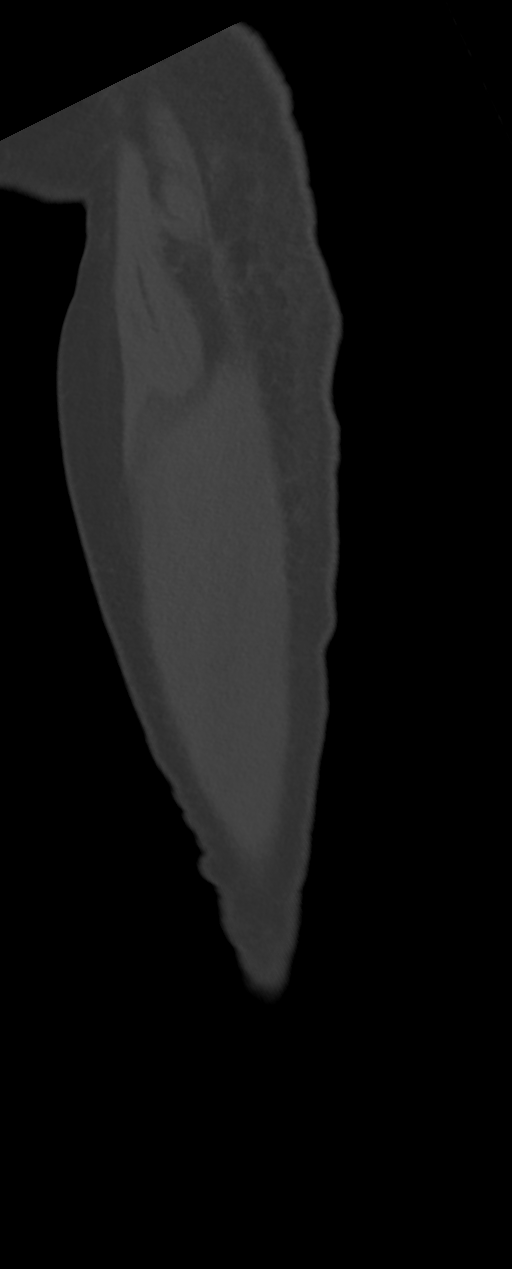
[im 29/87  bone]
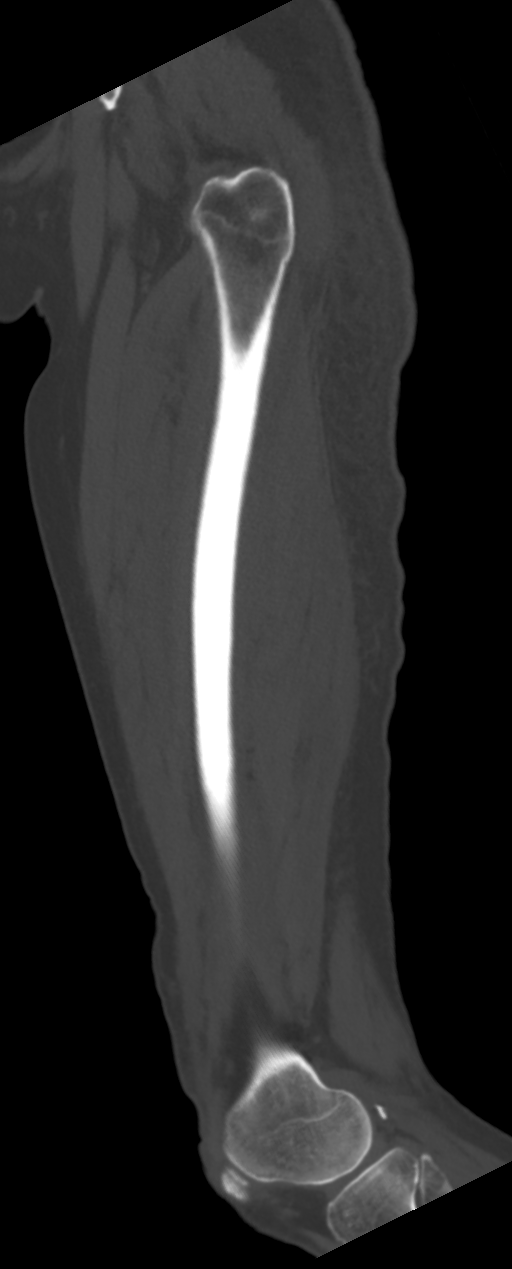
[im 44/87  bone]
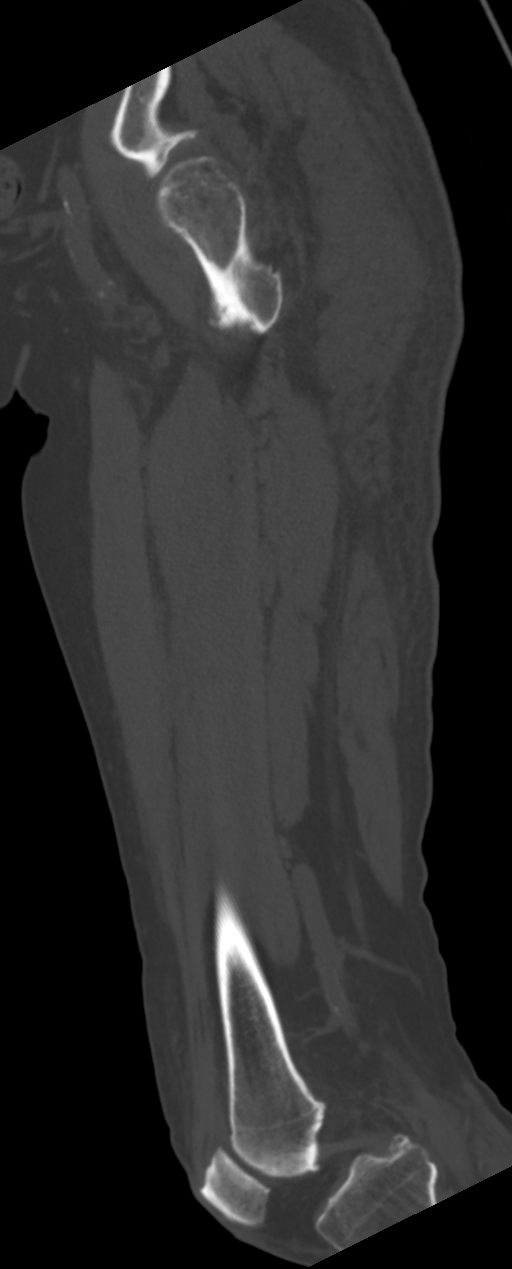
[im 56/87  soft-tissue]
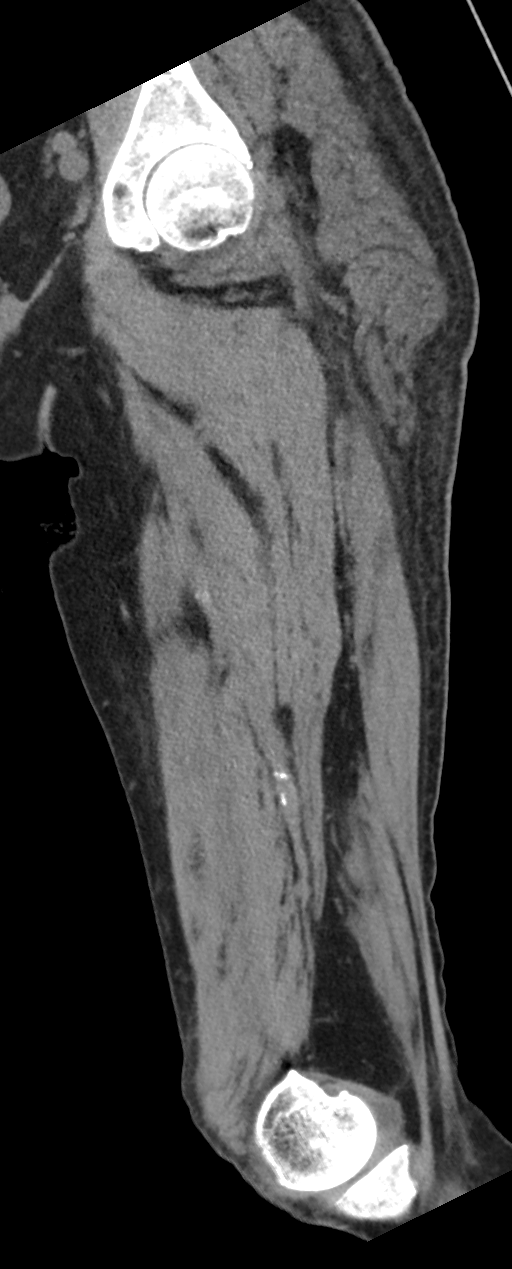
[im 58/87  bone]
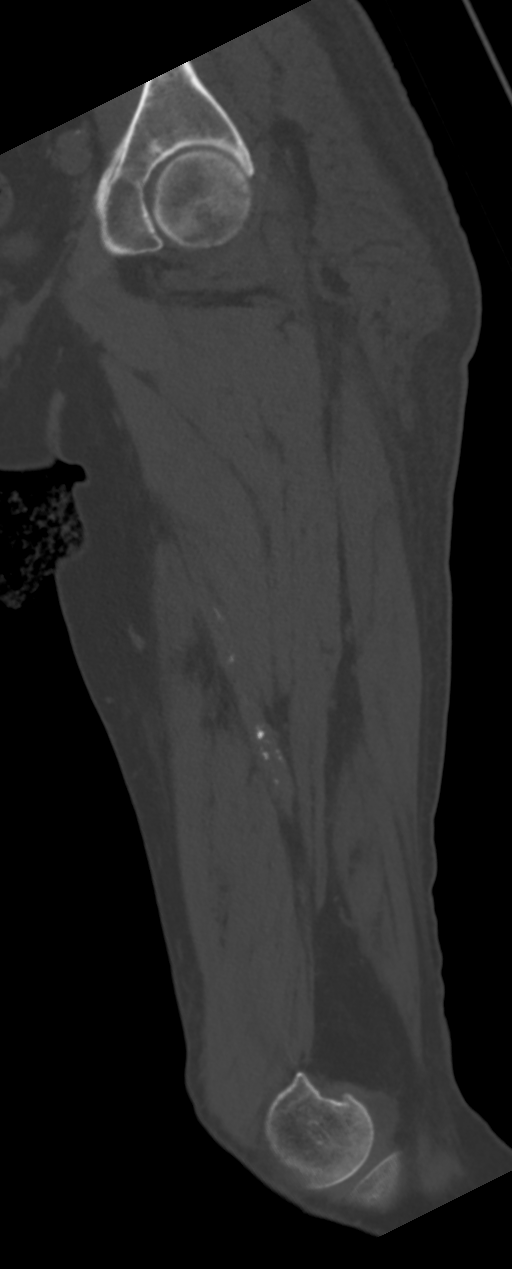
[im 72/87  bone]
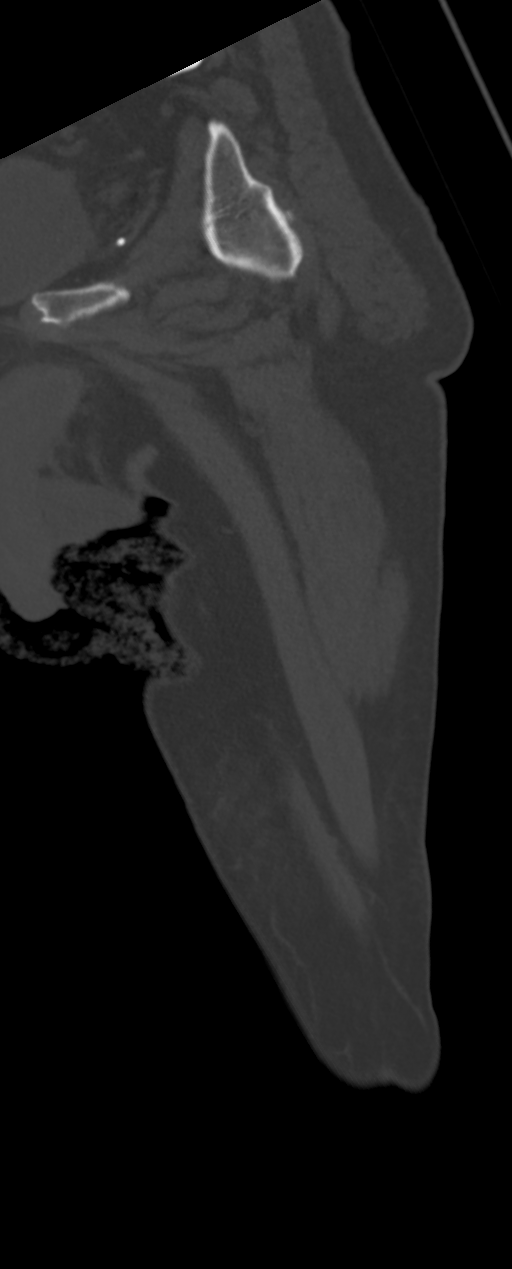

[10 of 29 positions shown; findings below may reference images not displayed]

FINDINGS: Bones/Joint/Cartilage

Images extend from the left hip through the left knee. The left
femur appears normal without evidence of acute fracture or
dislocation. There is no focal osseous abnormality. Mild
degenerative changes are present at the left hip and knee. No
significant joint effusions.

Ligaments

Suboptimally assessed by CT.

Muscles and Tendons

The left thigh muscles and tendons appear normal. The extensor
mechanism is intact at the knee.

Soft tissues

Mild subcutaneous edema posterolaterally in the proximal left thigh,
extending into the buttock. No focal fluid collection identified.
Iliofemoral atherosclerosis noted.
IMPRESSION: 1. The left femur appears normal without acute findings. Mild left
hip and knee degenerative changes.
2. Mild subcutaneous edema posterolaterally in the proximal left
thigh, possibly cellulitis. Correlate clinically. No focal fluid
collection.
